# Patient Record
Sex: Male | Born: 1935
Health system: Southern US, Community
[De-identification: ages and names within clinical notes are randomized; demographics above are authoritative.]

## PROBLEM LIST (undated history)

## (undated) DIAGNOSIS — I10 Essential (primary) hypertension: Secondary | ICD-10-CM

## (undated) DIAGNOSIS — R739 Hyperglycemia, unspecified: Secondary | ICD-10-CM

## (undated) DIAGNOSIS — K429 Umbilical hernia without obstruction or gangrene: Secondary | ICD-10-CM

## (undated) DIAGNOSIS — K5792 Diverticulitis of intestine, part unspecified, without perforation or abscess without bleeding: Secondary | ICD-10-CM

## (undated) DIAGNOSIS — I340 Nonrheumatic mitral (valve) insufficiency: Secondary | ICD-10-CM

## (undated) DIAGNOSIS — K4090 Unilateral inguinal hernia, without obstruction or gangrene, not specified as recurrent: Secondary | ICD-10-CM

## (undated) DIAGNOSIS — I4821 Permanent atrial fibrillation: Secondary | ICD-10-CM

## (undated) DIAGNOSIS — C801 Malignant (primary) neoplasm, unspecified: Secondary | ICD-10-CM

## (undated) DIAGNOSIS — H3530 Unspecified macular degeneration: Secondary | ICD-10-CM

## (undated) HISTORY — DX: Diverticulitis of intestine, part unspecified, without perforation or abscess without bleeding: K57.92

## (undated) HISTORY — DX: Unspecified macular degeneration: H35.30

## (undated) HISTORY — DX: Nonrheumatic mitral (valve) insufficiency: I34.0

## (undated) HISTORY — DX: Umbilical hernia without obstruction or gangrene: K42.9

## (undated) HISTORY — DX: Hyperglycemia, unspecified: R73.9

## (undated) HISTORY — PX: IRRIGATION AND DEBRIDEMENT SEBACEOUS CYST: SHX5255

## (undated) HISTORY — PX: CATARACT EXTRACTION: SUR2

## (undated) HISTORY — DX: Unilateral inguinal hernia, without obstruction or gangrene, not specified as recurrent: K40.90

---

## 2001-11-25 ENCOUNTER — Ambulatory Visit (HOSPITAL_COMMUNITY): Admission: RE | Admit: 2001-11-25 | Discharge: 2001-11-25 | Payer: Self-pay | Admitting: Gastroenterology

## 2010-08-24 ENCOUNTER — Encounter: Payer: Self-pay | Admitting: Emergency Medicine

## 2010-08-24 ENCOUNTER — Emergency Department (HOSPITAL_BASED_OUTPATIENT_CLINIC_OR_DEPARTMENT_OTHER)
Admission: EM | Admit: 2010-08-24 | Discharge: 2010-08-24 | Disposition: A | Discharge status: Home / Self Care | Payer: Medicare Other | Attending: Emergency Medicine | Admitting: Emergency Medicine

## 2010-08-24 DIAGNOSIS — L089 Local infection of the skin and subcutaneous tissue, unspecified: Secondary | ICD-10-CM | POA: Insufficient documentation

## 2010-08-24 DIAGNOSIS — I10 Essential (primary) hypertension: Secondary | ICD-10-CM | POA: Insufficient documentation

## 2010-08-24 HISTORY — DX: Essential (primary) hypertension: I10

## 2010-08-24 MED ORDER — DOXYCYCLINE HYCLATE 100 MG PO CAPS: 100.0000 mg | ORAL_CAPSULE | Freq: Two times a day (BID) | ORAL | Status: AC

## 2010-08-24 NOTE — ED Provider Notes (Signed)
History     CSN: [id number] Arrival date & time: 08/24/2010  3:09 PM  Chief Complaint  Patient presents with  . Finger Injury    infection in right middle finger   The history is provided by the patient.  Several day hx of right middle finger distal infection, which in the past has drained pus. No further drainage. Improving just not resolving. Pain is 2/10 and made worse by palpation not by ROM function.  He thinks it occurred due to injury from removing pool tarp.  Past Medical History  Diagnosis Date  . Hypertension     History reviewed. No pertinent past surgical history.  History reviewed. No pertinent family history.  History  Substance Use Topics  . Smoking status: Never Smoker   . Smokeless tobacco: Not on file  . Alcohol Use: Yes     "very moderate"      Review of Systems  Constitutional: Negative for fever.  HENT: Negative for neck pain.   Respiratory: Negative for cough, chest tightness and shortness of breath.   Cardiovascular: Negative for chest pain.  Gastrointestinal: Negative for abdominal pain.  Musculoskeletal: Positive for joint swelling.  Skin: Positive for wound. Negative for rash.  Neurological: Negative for headaches.  Hematological: Does not bruise/bleed easily.    Physical Exam  BP 152/104  Pulse 62  Temp(Src) 97.5 F (36.4 C) (Oral)  Resp 18  SpO2 97%  Physical Exam  Constitutional: He is oriented to person, place, and time. He appears well-developed and well-nourished.  HENT:  Head: Normocephalic and atraumatic.  Eyes: Conjunctivae and EOM are normal. Pupils are equal, round, and reactive to light.  Neck: Normal range of motion.  Cardiovascular: Normal rate, regular rhythm and normal heart sounds.   Pulmonary/Chest: Effort normal and breath sounds normal.  Abdominal: Soft. Bowel sounds are normal.  Musculoskeletal: Normal range of motion. He exhibits tenderness.       RIGHT MIDDLE FINGER DISTAL WITH HEALING PARONYCHIAL INFECTION,  NO PURULENT DRAINAGE, NO SIG PAIN WITH ROM, CAP REFILL INTACT.   Neurological: He is alert and oriented to person, place, and time.    ED Course  Procedures  MDM Resolving right middle finger paronychial infection. No deep space infection. Will benefit with oral antibiotics.         Shelda Jakes, MD 08/24/10 [phone]

## 2010-08-24 NOTE — ED Notes (Signed)
Pt states he cut his finger Monday afternoon and noticed that his finger was red and swollen on Wednesday. Pt states that pus came out from finger. Pt cleaned site with alcohol but finger was still the same on Thursday.

## 2011-03-08 DIAGNOSIS — H353190 Nonexudative age-related macular degeneration, unspecified eye, stage unspecified: Secondary | ICD-10-CM | POA: Diagnosis not present

## 2011-03-08 DIAGNOSIS — H4011X0 Primary open-angle glaucoma, stage unspecified: Secondary | ICD-10-CM | POA: Diagnosis not present

## 2011-03-08 DIAGNOSIS — H47329 Drusen of optic disc, unspecified eye: Secondary | ICD-10-CM | POA: Diagnosis not present

## 2011-06-18 DIAGNOSIS — I10 Essential (primary) hypertension: Secondary | ICD-10-CM | POA: Diagnosis not present

## 2011-06-18 DIAGNOSIS — T148 Other injury of unspecified body region: Secondary | ICD-10-CM | POA: Diagnosis not present

## 2011-06-18 DIAGNOSIS — W57XXXA Bitten or stung by nonvenomous insect and other nonvenomous arthropods, initial encounter: Secondary | ICD-10-CM | POA: Diagnosis not present

## 2011-06-18 DIAGNOSIS — Z23 Encounter for immunization: Secondary | ICD-10-CM | POA: Diagnosis not present

## 2011-08-27 DIAGNOSIS — I059 Rheumatic mitral valve disease, unspecified: Secondary | ICD-10-CM | POA: Diagnosis not present

## 2011-08-27 DIAGNOSIS — Z79899 Other long term (current) drug therapy: Secondary | ICD-10-CM | POA: Diagnosis not present

## 2011-08-27 DIAGNOSIS — I10 Essential (primary) hypertension: Secondary | ICD-10-CM | POA: Diagnosis not present

## 2011-09-02 DIAGNOSIS — I059 Rheumatic mitral valve disease, unspecified: Secondary | ICD-10-CM | POA: Diagnosis not present

## 2011-09-13 DIAGNOSIS — D485 Neoplasm of uncertain behavior of skin: Secondary | ICD-10-CM | POA: Diagnosis not present

## 2011-09-13 DIAGNOSIS — L280 Lichen simplex chronicus: Secondary | ICD-10-CM | POA: Diagnosis not present

## 2011-09-13 DIAGNOSIS — I781 Nevus, non-neoplastic: Secondary | ICD-10-CM | POA: Diagnosis not present

## 2011-09-13 DIAGNOSIS — L570 Actinic keratosis: Secondary | ICD-10-CM | POA: Diagnosis not present

## 2011-09-13 DIAGNOSIS — L723 Sebaceous cyst: Secondary | ICD-10-CM | POA: Diagnosis not present

## 2011-09-13 DIAGNOSIS — L821 Other seborrheic keratosis: Secondary | ICD-10-CM | POA: Diagnosis not present

## 2011-11-29 DIAGNOSIS — Z23 Encounter for immunization: Secondary | ICD-10-CM | POA: Diagnosis not present

## 2012-03-30 DIAGNOSIS — H47329 Drusen of optic disc, unspecified eye: Secondary | ICD-10-CM | POA: Diagnosis not present

## 2012-03-30 DIAGNOSIS — H353190 Nonexudative age-related macular degeneration, unspecified eye, stage unspecified: Secondary | ICD-10-CM | POA: Diagnosis not present

## 2012-03-30 DIAGNOSIS — H409 Unspecified glaucoma: Secondary | ICD-10-CM | POA: Diagnosis not present

## 2012-03-30 DIAGNOSIS — H4011X0 Primary open-angle glaucoma, stage unspecified: Secondary | ICD-10-CM | POA: Diagnosis not present

## 2012-04-15 ENCOUNTER — Other Ambulatory Visit: Payer: Self-pay | Admitting: Internal Medicine

## 2012-04-15 DIAGNOSIS — R1031 Right lower quadrant pain: Secondary | ICD-10-CM

## 2012-04-16 ENCOUNTER — Ambulatory Visit
Admission: RE | Admit: 2012-04-16 | Discharge: 2012-04-16 | Disposition: A | Discharge status: Home / Self Care | Payer: Medicare Other | Source: Ambulatory Visit | Attending: Internal Medicine | Admitting: Internal Medicine

## 2012-04-16 DIAGNOSIS — R1031 Right lower quadrant pain: Secondary | ICD-10-CM

## 2012-04-16 MED ORDER — IOHEXOL 300 MG/ML  SOLN
100.0000 mL | Freq: Once | INTRAMUSCULAR | Status: AC | PRN
  Administered 2012-04-16: 100 mL via INTRAVENOUS

## 2012-05-07 DIAGNOSIS — M999 Biomechanical lesion, unspecified: Secondary | ICD-10-CM | POA: Diagnosis not present

## 2012-05-07 DIAGNOSIS — I10 Essential (primary) hypertension: Secondary | ICD-10-CM | POA: Diagnosis not present

## 2012-05-07 DIAGNOSIS — M461 Sacroiliitis, not elsewhere classified: Secondary | ICD-10-CM | POA: Diagnosis not present

## 2012-05-07 DIAGNOSIS — IMO0002 Reserved for concepts with insufficient information to code with codable children: Secondary | ICD-10-CM | POA: Diagnosis not present

## 2012-05-11 DIAGNOSIS — M999 Biomechanical lesion, unspecified: Secondary | ICD-10-CM | POA: Diagnosis not present

## 2012-05-11 DIAGNOSIS — M461 Sacroiliitis, not elsewhere classified: Secondary | ICD-10-CM | POA: Diagnosis not present

## 2012-05-11 DIAGNOSIS — IMO0002 Reserved for concepts with insufficient information to code with codable children: Secondary | ICD-10-CM | POA: Diagnosis not present

## 2012-05-11 DIAGNOSIS — I10 Essential (primary) hypertension: Secondary | ICD-10-CM | POA: Diagnosis not present

## 2012-05-12 DIAGNOSIS — M461 Sacroiliitis, not elsewhere classified: Secondary | ICD-10-CM | POA: Diagnosis not present

## 2012-05-12 DIAGNOSIS — M999 Biomechanical lesion, unspecified: Secondary | ICD-10-CM | POA: Diagnosis not present

## 2012-05-12 DIAGNOSIS — I10 Essential (primary) hypertension: Secondary | ICD-10-CM | POA: Diagnosis not present

## 2012-05-12 DIAGNOSIS — IMO0002 Reserved for concepts with insufficient information to code with codable children: Secondary | ICD-10-CM | POA: Diagnosis not present

## 2012-05-14 DIAGNOSIS — I10 Essential (primary) hypertension: Secondary | ICD-10-CM | POA: Diagnosis not present

## 2012-05-14 DIAGNOSIS — M461 Sacroiliitis, not elsewhere classified: Secondary | ICD-10-CM | POA: Diagnosis not present

## 2012-05-14 DIAGNOSIS — IMO0002 Reserved for concepts with insufficient information to code with codable children: Secondary | ICD-10-CM | POA: Diagnosis not present

## 2012-05-14 DIAGNOSIS — M999 Biomechanical lesion, unspecified: Secondary | ICD-10-CM | POA: Diagnosis not present

## 2012-05-18 DIAGNOSIS — IMO0002 Reserved for concepts with insufficient information to code with codable children: Secondary | ICD-10-CM | POA: Diagnosis not present

## 2012-05-18 DIAGNOSIS — I10 Essential (primary) hypertension: Secondary | ICD-10-CM | POA: Diagnosis not present

## 2012-05-18 DIAGNOSIS — M999 Biomechanical lesion, unspecified: Secondary | ICD-10-CM | POA: Diagnosis not present

## 2012-05-18 DIAGNOSIS — M461 Sacroiliitis, not elsewhere classified: Secondary | ICD-10-CM | POA: Diagnosis not present

## 2012-05-19 DIAGNOSIS — I10 Essential (primary) hypertension: Secondary | ICD-10-CM | POA: Diagnosis not present

## 2012-05-19 DIAGNOSIS — M461 Sacroiliitis, not elsewhere classified: Secondary | ICD-10-CM | POA: Diagnosis not present

## 2012-05-19 DIAGNOSIS — M999 Biomechanical lesion, unspecified: Secondary | ICD-10-CM | POA: Diagnosis not present

## 2012-05-19 DIAGNOSIS — IMO0002 Reserved for concepts with insufficient information to code with codable children: Secondary | ICD-10-CM | POA: Diagnosis not present

## 2012-05-21 DIAGNOSIS — IMO0002 Reserved for concepts with insufficient information to code with codable children: Secondary | ICD-10-CM | POA: Diagnosis not present

## 2012-05-21 DIAGNOSIS — M999 Biomechanical lesion, unspecified: Secondary | ICD-10-CM | POA: Diagnosis not present

## 2012-05-21 DIAGNOSIS — I10 Essential (primary) hypertension: Secondary | ICD-10-CM | POA: Diagnosis not present

## 2012-05-21 DIAGNOSIS — M461 Sacroiliitis, not elsewhere classified: Secondary | ICD-10-CM | POA: Diagnosis not present

## 2012-05-25 DIAGNOSIS — I10 Essential (primary) hypertension: Secondary | ICD-10-CM | POA: Diagnosis not present

## 2012-05-25 DIAGNOSIS — M999 Biomechanical lesion, unspecified: Secondary | ICD-10-CM | POA: Diagnosis not present

## 2012-05-25 DIAGNOSIS — M461 Sacroiliitis, not elsewhere classified: Secondary | ICD-10-CM | POA: Diagnosis not present

## 2012-05-25 DIAGNOSIS — IMO0002 Reserved for concepts with insufficient information to code with codable children: Secondary | ICD-10-CM | POA: Diagnosis not present

## 2012-05-26 DIAGNOSIS — M999 Biomechanical lesion, unspecified: Secondary | ICD-10-CM | POA: Diagnosis not present

## 2012-05-26 DIAGNOSIS — IMO0002 Reserved for concepts with insufficient information to code with codable children: Secondary | ICD-10-CM | POA: Diagnosis not present

## 2012-05-26 DIAGNOSIS — I10 Essential (primary) hypertension: Secondary | ICD-10-CM | POA: Diagnosis not present

## 2012-05-26 DIAGNOSIS — M461 Sacroiliitis, not elsewhere classified: Secondary | ICD-10-CM | POA: Diagnosis not present

## 2012-05-28 DIAGNOSIS — M461 Sacroiliitis, not elsewhere classified: Secondary | ICD-10-CM | POA: Diagnosis not present

## 2012-05-28 DIAGNOSIS — IMO0002 Reserved for concepts with insufficient information to code with codable children: Secondary | ICD-10-CM | POA: Diagnosis not present

## 2012-05-28 DIAGNOSIS — I10 Essential (primary) hypertension: Secondary | ICD-10-CM | POA: Diagnosis not present

## 2012-05-28 DIAGNOSIS — M999 Biomechanical lesion, unspecified: Secondary | ICD-10-CM | POA: Diagnosis not present

## 2012-06-02 DIAGNOSIS — M461 Sacroiliitis, not elsewhere classified: Secondary | ICD-10-CM | POA: Diagnosis not present

## 2012-06-02 DIAGNOSIS — I10 Essential (primary) hypertension: Secondary | ICD-10-CM | POA: Diagnosis not present

## 2012-06-02 DIAGNOSIS — M999 Biomechanical lesion, unspecified: Secondary | ICD-10-CM | POA: Diagnosis not present

## 2012-06-02 DIAGNOSIS — IMO0002 Reserved for concepts with insufficient information to code with codable children: Secondary | ICD-10-CM | POA: Diagnosis not present

## 2012-06-04 DIAGNOSIS — M461 Sacroiliitis, not elsewhere classified: Secondary | ICD-10-CM | POA: Diagnosis not present

## 2012-06-04 DIAGNOSIS — IMO0002 Reserved for concepts with insufficient information to code with codable children: Secondary | ICD-10-CM | POA: Diagnosis not present

## 2012-06-04 DIAGNOSIS — M999 Biomechanical lesion, unspecified: Secondary | ICD-10-CM | POA: Diagnosis not present

## 2012-06-04 DIAGNOSIS — I10 Essential (primary) hypertension: Secondary | ICD-10-CM | POA: Diagnosis not present

## 2012-06-08 DIAGNOSIS — M461 Sacroiliitis, not elsewhere classified: Secondary | ICD-10-CM | POA: Diagnosis not present

## 2012-06-08 DIAGNOSIS — IMO0002 Reserved for concepts with insufficient information to code with codable children: Secondary | ICD-10-CM | POA: Diagnosis not present

## 2012-06-08 DIAGNOSIS — I10 Essential (primary) hypertension: Secondary | ICD-10-CM | POA: Diagnosis not present

## 2012-06-08 DIAGNOSIS — M999 Biomechanical lesion, unspecified: Secondary | ICD-10-CM | POA: Diagnosis not present

## 2012-06-11 DIAGNOSIS — M461 Sacroiliitis, not elsewhere classified: Secondary | ICD-10-CM | POA: Diagnosis not present

## 2012-06-11 DIAGNOSIS — IMO0002 Reserved for concepts with insufficient information to code with codable children: Secondary | ICD-10-CM | POA: Diagnosis not present

## 2012-06-11 DIAGNOSIS — M999 Biomechanical lesion, unspecified: Secondary | ICD-10-CM | POA: Diagnosis not present

## 2012-06-11 DIAGNOSIS — I10 Essential (primary) hypertension: Secondary | ICD-10-CM | POA: Diagnosis not present

## 2012-06-15 DIAGNOSIS — I10 Essential (primary) hypertension: Secondary | ICD-10-CM | POA: Diagnosis not present

## 2012-06-15 DIAGNOSIS — M461 Sacroiliitis, not elsewhere classified: Secondary | ICD-10-CM | POA: Diagnosis not present

## 2012-06-15 DIAGNOSIS — M999 Biomechanical lesion, unspecified: Secondary | ICD-10-CM | POA: Diagnosis not present

## 2012-06-15 DIAGNOSIS — IMO0002 Reserved for concepts with insufficient information to code with codable children: Secondary | ICD-10-CM | POA: Diagnosis not present

## 2012-06-24 DIAGNOSIS — I10 Essential (primary) hypertension: Secondary | ICD-10-CM | POA: Diagnosis not present

## 2012-06-24 DIAGNOSIS — M461 Sacroiliitis, not elsewhere classified: Secondary | ICD-10-CM | POA: Diagnosis not present

## 2012-06-24 DIAGNOSIS — IMO0002 Reserved for concepts with insufficient information to code with codable children: Secondary | ICD-10-CM | POA: Diagnosis not present

## 2012-06-24 DIAGNOSIS — M999 Biomechanical lesion, unspecified: Secondary | ICD-10-CM | POA: Diagnosis not present

## 2012-06-25 DIAGNOSIS — I10 Essential (primary) hypertension: Secondary | ICD-10-CM | POA: Diagnosis not present

## 2012-06-25 DIAGNOSIS — M461 Sacroiliitis, not elsewhere classified: Secondary | ICD-10-CM | POA: Diagnosis not present

## 2012-06-25 DIAGNOSIS — M999 Biomechanical lesion, unspecified: Secondary | ICD-10-CM | POA: Diagnosis not present

## 2012-06-25 DIAGNOSIS — IMO0002 Reserved for concepts with insufficient information to code with codable children: Secondary | ICD-10-CM | POA: Diagnosis not present

## 2012-06-29 DIAGNOSIS — I10 Essential (primary) hypertension: Secondary | ICD-10-CM | POA: Diagnosis not present

## 2012-06-29 DIAGNOSIS — M999 Biomechanical lesion, unspecified: Secondary | ICD-10-CM | POA: Diagnosis not present

## 2012-06-29 DIAGNOSIS — M461 Sacroiliitis, not elsewhere classified: Secondary | ICD-10-CM | POA: Diagnosis not present

## 2012-06-29 DIAGNOSIS — IMO0002 Reserved for concepts with insufficient information to code with codable children: Secondary | ICD-10-CM | POA: Diagnosis not present

## 2012-07-02 DIAGNOSIS — M461 Sacroiliitis, not elsewhere classified: Secondary | ICD-10-CM | POA: Diagnosis not present

## 2012-07-02 DIAGNOSIS — I10 Essential (primary) hypertension: Secondary | ICD-10-CM | POA: Diagnosis not present

## 2012-07-02 DIAGNOSIS — IMO0002 Reserved for concepts with insufficient information to code with codable children: Secondary | ICD-10-CM | POA: Diagnosis not present

## 2012-07-02 DIAGNOSIS — M999 Biomechanical lesion, unspecified: Secondary | ICD-10-CM | POA: Diagnosis not present

## 2012-07-06 DIAGNOSIS — I10 Essential (primary) hypertension: Secondary | ICD-10-CM | POA: Diagnosis not present

## 2012-07-06 DIAGNOSIS — Z1331 Encounter for screening for depression: Secondary | ICD-10-CM | POA: Diagnosis not present

## 2012-07-06 DIAGNOSIS — I059 Rheumatic mitral valve disease, unspecified: Secondary | ICD-10-CM | POA: Diagnosis not present

## 2012-07-06 DIAGNOSIS — Z0000 Encounter for general adult medical examination without abnormal findings: Secondary | ICD-10-CM | POA: Diagnosis not present

## 2012-07-13 DIAGNOSIS — IMO0002 Reserved for concepts with insufficient information to code with codable children: Secondary | ICD-10-CM | POA: Diagnosis not present

## 2012-07-13 DIAGNOSIS — M461 Sacroiliitis, not elsewhere classified: Secondary | ICD-10-CM | POA: Diagnosis not present

## 2012-07-13 DIAGNOSIS — M999 Biomechanical lesion, unspecified: Secondary | ICD-10-CM | POA: Diagnosis not present

## 2012-07-13 DIAGNOSIS — I10 Essential (primary) hypertension: Secondary | ICD-10-CM | POA: Diagnosis not present

## 2012-07-20 DIAGNOSIS — M461 Sacroiliitis, not elsewhere classified: Secondary | ICD-10-CM | POA: Diagnosis not present

## 2012-07-20 DIAGNOSIS — I10 Essential (primary) hypertension: Secondary | ICD-10-CM | POA: Diagnosis not present

## 2012-07-20 DIAGNOSIS — M999 Biomechanical lesion, unspecified: Secondary | ICD-10-CM | POA: Diagnosis not present

## 2012-07-20 DIAGNOSIS — IMO0002 Reserved for concepts with insufficient information to code with codable children: Secondary | ICD-10-CM | POA: Diagnosis not present

## 2012-08-04 DIAGNOSIS — M999 Biomechanical lesion, unspecified: Secondary | ICD-10-CM | POA: Diagnosis not present

## 2012-08-04 DIAGNOSIS — I10 Essential (primary) hypertension: Secondary | ICD-10-CM | POA: Diagnosis not present

## 2012-08-04 DIAGNOSIS — M461 Sacroiliitis, not elsewhere classified: Secondary | ICD-10-CM | POA: Diagnosis not present

## 2012-08-04 DIAGNOSIS — IMO0002 Reserved for concepts with insufficient information to code with codable children: Secondary | ICD-10-CM | POA: Diagnosis not present

## 2012-08-13 DIAGNOSIS — I10 Essential (primary) hypertension: Secondary | ICD-10-CM | POA: Diagnosis not present

## 2012-08-13 DIAGNOSIS — M999 Biomechanical lesion, unspecified: Secondary | ICD-10-CM | POA: Diagnosis not present

## 2012-08-13 DIAGNOSIS — M461 Sacroiliitis, not elsewhere classified: Secondary | ICD-10-CM | POA: Diagnosis not present

## 2012-08-13 DIAGNOSIS — IMO0002 Reserved for concepts with insufficient information to code with codable children: Secondary | ICD-10-CM | POA: Diagnosis not present

## 2012-09-08 DIAGNOSIS — I10 Essential (primary) hypertension: Secondary | ICD-10-CM | POA: Diagnosis not present

## 2012-09-08 DIAGNOSIS — M999 Biomechanical lesion, unspecified: Secondary | ICD-10-CM | POA: Diagnosis not present

## 2012-09-08 DIAGNOSIS — M461 Sacroiliitis, not elsewhere classified: Secondary | ICD-10-CM | POA: Diagnosis not present

## 2012-09-08 DIAGNOSIS — IMO0002 Reserved for concepts with insufficient information to code with codable children: Secondary | ICD-10-CM | POA: Diagnosis not present

## 2012-10-01 DIAGNOSIS — H353190 Nonexudative age-related macular degeneration, unspecified eye, stage unspecified: Secondary | ICD-10-CM | POA: Diagnosis not present

## 2012-10-01 DIAGNOSIS — H47329 Drusen of optic disc, unspecified eye: Secondary | ICD-10-CM | POA: Diagnosis not present

## 2012-10-01 DIAGNOSIS — H4011X0 Primary open-angle glaucoma, stage unspecified: Secondary | ICD-10-CM | POA: Diagnosis not present

## 2012-10-01 DIAGNOSIS — H409 Unspecified glaucoma: Secondary | ICD-10-CM | POA: Diagnosis not present

## 2012-10-01 DIAGNOSIS — H401110 Primary open-angle glaucoma, right eye, stage unspecified: Secondary | ICD-10-CM | POA: Insufficient documentation

## 2012-11-23 DIAGNOSIS — Z23 Encounter for immunization: Secondary | ICD-10-CM | POA: Diagnosis not present

## 2012-11-26 DIAGNOSIS — H2510 Age-related nuclear cataract, unspecified eye: Secondary | ICD-10-CM | POA: Diagnosis not present

## 2012-11-26 DIAGNOSIS — H47329 Drusen of optic disc, unspecified eye: Secondary | ICD-10-CM | POA: Diagnosis not present

## 2012-11-26 DIAGNOSIS — H353190 Nonexudative age-related macular degeneration, unspecified eye, stage unspecified: Secondary | ICD-10-CM | POA: Diagnosis not present

## 2012-11-26 DIAGNOSIS — H40019 Open angle with borderline findings, low risk, unspecified eye: Secondary | ICD-10-CM | POA: Diagnosis not present

## 2013-02-08 DIAGNOSIS — I10 Essential (primary) hypertension: Secondary | ICD-10-CM | POA: Diagnosis not present

## 2013-02-08 DIAGNOSIS — Z79899 Other long term (current) drug therapy: Secondary | ICD-10-CM | POA: Diagnosis not present

## 2013-06-30 ENCOUNTER — Encounter: Payer: Self-pay | Admitting: *Deleted

## 2013-07-01 DIAGNOSIS — H409 Unspecified glaucoma: Secondary | ICD-10-CM | POA: Diagnosis not present

## 2013-07-01 DIAGNOSIS — H353190 Nonexudative age-related macular degeneration, unspecified eye, stage unspecified: Secondary | ICD-10-CM | POA: Diagnosis not present

## 2013-07-01 DIAGNOSIS — H4011X0 Primary open-angle glaucoma, stage unspecified: Secondary | ICD-10-CM | POA: Diagnosis not present

## 2013-07-01 DIAGNOSIS — H47329 Drusen of optic disc, unspecified eye: Secondary | ICD-10-CM | POA: Diagnosis not present

## 2013-09-01 DIAGNOSIS — Z1331 Encounter for screening for depression: Secondary | ICD-10-CM | POA: Diagnosis not present

## 2013-09-01 DIAGNOSIS — Z79899 Other long term (current) drug therapy: Secondary | ICD-10-CM | POA: Diagnosis not present

## 2013-09-01 DIAGNOSIS — M25569 Pain in unspecified knee: Secondary | ICD-10-CM | POA: Diagnosis not present

## 2013-09-01 DIAGNOSIS — Z23 Encounter for immunization: Secondary | ICD-10-CM | POA: Diagnosis not present

## 2013-09-01 DIAGNOSIS — I10 Essential (primary) hypertension: Secondary | ICD-10-CM | POA: Diagnosis not present

## 2013-09-01 DIAGNOSIS — Z0000 Encounter for general adult medical examination without abnormal findings: Secondary | ICD-10-CM | POA: Diagnosis not present

## 2013-11-15 DIAGNOSIS — Z23 Encounter for immunization: Secondary | ICD-10-CM | POA: Diagnosis not present

## 2014-02-09 DIAGNOSIS — H4011X3 Primary open-angle glaucoma, severe stage: Secondary | ICD-10-CM | POA: Diagnosis not present

## 2014-02-09 DIAGNOSIS — H47323 Drusen of optic disc, bilateral: Secondary | ICD-10-CM | POA: Diagnosis not present

## 2014-02-09 DIAGNOSIS — H3531 Nonexudative age-related macular degeneration: Secondary | ICD-10-CM | POA: Diagnosis not present

## 2014-02-09 DIAGNOSIS — H4011X2 Primary open-angle glaucoma, moderate stage: Secondary | ICD-10-CM | POA: Diagnosis not present

## 2014-02-18 DIAGNOSIS — E669 Obesity, unspecified: Secondary | ICD-10-CM | POA: Diagnosis not present

## 2014-02-18 DIAGNOSIS — Z79899 Other long term (current) drug therapy: Secondary | ICD-10-CM | POA: Diagnosis not present

## 2014-02-18 DIAGNOSIS — I10 Essential (primary) hypertension: Secondary | ICD-10-CM | POA: Diagnosis not present

## 2014-02-18 DIAGNOSIS — Z6830 Body mass index (BMI) 30.0-30.9, adult: Secondary | ICD-10-CM | POA: Diagnosis not present

## 2014-05-06 DIAGNOSIS — H47323 Drusen of optic disc, bilateral: Secondary | ICD-10-CM | POA: Diagnosis not present

## 2014-05-06 DIAGNOSIS — H40013 Open angle with borderline findings, low risk, bilateral: Secondary | ICD-10-CM | POA: Diagnosis not present

## 2014-05-06 DIAGNOSIS — H25811 Combined forms of age-related cataract, right eye: Secondary | ICD-10-CM | POA: Diagnosis not present

## 2014-05-06 DIAGNOSIS — H3531 Nonexudative age-related macular degeneration: Secondary | ICD-10-CM | POA: Diagnosis not present

## 2014-05-06 DIAGNOSIS — H25813 Combined forms of age-related cataract, bilateral: Secondary | ICD-10-CM | POA: Diagnosis not present

## 2014-05-16 DIAGNOSIS — H2511 Age-related nuclear cataract, right eye: Secondary | ICD-10-CM | POA: Diagnosis not present

## 2014-05-16 DIAGNOSIS — H268 Other specified cataract: Secondary | ICD-10-CM | POA: Diagnosis not present

## 2014-06-08 DIAGNOSIS — H2512 Age-related nuclear cataract, left eye: Secondary | ICD-10-CM | POA: Diagnosis not present

## 2014-06-13 DIAGNOSIS — H2512 Age-related nuclear cataract, left eye: Secondary | ICD-10-CM | POA: Diagnosis not present

## 2014-06-13 DIAGNOSIS — H25812 Combined forms of age-related cataract, left eye: Secondary | ICD-10-CM | POA: Diagnosis not present

## 2014-11-08 DIAGNOSIS — Z23 Encounter for immunization: Secondary | ICD-10-CM | POA: Diagnosis not present

## 2014-12-19 DIAGNOSIS — L821 Other seborrheic keratosis: Secondary | ICD-10-CM | POA: Diagnosis not present

## 2014-12-19 DIAGNOSIS — D1801 Hemangioma of skin and subcutaneous tissue: Secondary | ICD-10-CM | POA: Diagnosis not present

## 2014-12-19 DIAGNOSIS — D225 Melanocytic nevi of trunk: Secondary | ICD-10-CM | POA: Diagnosis not present

## 2014-12-19 DIAGNOSIS — D2272 Melanocytic nevi of left lower limb, including hip: Secondary | ICD-10-CM | POA: Diagnosis not present

## 2014-12-19 DIAGNOSIS — L570 Actinic keratosis: Secondary | ICD-10-CM | POA: Diagnosis not present

## 2014-12-19 DIAGNOSIS — L812 Freckles: Secondary | ICD-10-CM | POA: Diagnosis not present

## 2014-12-19 DIAGNOSIS — L723 Sebaceous cyst: Secondary | ICD-10-CM | POA: Diagnosis not present

## 2014-12-19 DIAGNOSIS — L820 Inflamed seborrheic keratosis: Secondary | ICD-10-CM | POA: Diagnosis not present

## 2014-12-19 DIAGNOSIS — D2271 Melanocytic nevi of right lower limb, including hip: Secondary | ICD-10-CM | POA: Diagnosis not present

## 2015-02-01 DIAGNOSIS — H47323 Drusen of optic disc, bilateral: Secondary | ICD-10-CM | POA: Diagnosis not present

## 2015-02-01 DIAGNOSIS — Z961 Presence of intraocular lens: Secondary | ICD-10-CM | POA: Diagnosis not present

## 2015-02-01 DIAGNOSIS — H40013 Open angle with borderline findings, low risk, bilateral: Secondary | ICD-10-CM | POA: Diagnosis not present

## 2015-02-01 DIAGNOSIS — H353131 Nonexudative age-related macular degeneration, bilateral, early dry stage: Secondary | ICD-10-CM | POA: Diagnosis not present

## 2015-05-22 DIAGNOSIS — S30860A Insect bite (nonvenomous) of lower back and pelvis, initial encounter: Secondary | ICD-10-CM | POA: Diagnosis not present

## 2015-05-22 DIAGNOSIS — W57XXXA Bitten or stung by nonvenomous insect and other nonvenomous arthropods, initial encounter: Secondary | ICD-10-CM | POA: Diagnosis not present

## 2015-08-14 DIAGNOSIS — Z1389 Encounter for screening for other disorder: Secondary | ICD-10-CM | POA: Diagnosis not present

## 2015-08-14 DIAGNOSIS — I340 Nonrheumatic mitral (valve) insufficiency: Secondary | ICD-10-CM | POA: Diagnosis not present

## 2015-08-14 DIAGNOSIS — Z0000 Encounter for general adult medical examination without abnormal findings: Secondary | ICD-10-CM | POA: Diagnosis not present

## 2015-08-14 DIAGNOSIS — I10 Essential (primary) hypertension: Secondary | ICD-10-CM | POA: Diagnosis not present

## 2015-08-14 DIAGNOSIS — Z79899 Other long term (current) drug therapy: Secondary | ICD-10-CM | POA: Diagnosis not present

## 2015-08-14 DIAGNOSIS — R739 Hyperglycemia, unspecified: Secondary | ICD-10-CM | POA: Diagnosis not present

## 2015-08-15 ENCOUNTER — Other Ambulatory Visit: Payer: Self-pay | Admitting: Geriatric Medicine

## 2015-08-15 DIAGNOSIS — I340 Nonrheumatic mitral (valve) insufficiency: Secondary | ICD-10-CM

## 2015-08-25 DIAGNOSIS — M5136 Other intervertebral disc degeneration, lumbar region: Secondary | ICD-10-CM | POA: Diagnosis not present

## 2015-08-25 DIAGNOSIS — M9903 Segmental and somatic dysfunction of lumbar region: Secondary | ICD-10-CM | POA: Diagnosis not present

## 2015-08-25 DIAGNOSIS — M545 Low back pain: Secondary | ICD-10-CM | POA: Diagnosis not present

## 2015-08-25 DIAGNOSIS — M62830 Muscle spasm of back: Secondary | ICD-10-CM | POA: Diagnosis not present

## 2015-09-12 ENCOUNTER — Other Ambulatory Visit (HOSPITAL_COMMUNITY): Payer: Self-pay

## 2015-09-13 DIAGNOSIS — M9903 Segmental and somatic dysfunction of lumbar region: Secondary | ICD-10-CM | POA: Diagnosis not present

## 2015-09-13 DIAGNOSIS — M545 Low back pain: Secondary | ICD-10-CM | POA: Diagnosis not present

## 2015-09-13 DIAGNOSIS — M62830 Muscle spasm of back: Secondary | ICD-10-CM | POA: Diagnosis not present

## 2015-09-13 DIAGNOSIS — M5136 Other intervertebral disc degeneration, lumbar region: Secondary | ICD-10-CM | POA: Diagnosis not present

## 2015-09-14 DIAGNOSIS — M62830 Muscle spasm of back: Secondary | ICD-10-CM | POA: Diagnosis not present

## 2015-09-14 DIAGNOSIS — M5136 Other intervertebral disc degeneration, lumbar region: Secondary | ICD-10-CM | POA: Diagnosis not present

## 2015-09-14 DIAGNOSIS — M545 Low back pain: Secondary | ICD-10-CM | POA: Diagnosis not present

## 2015-09-14 DIAGNOSIS — M9903 Segmental and somatic dysfunction of lumbar region: Secondary | ICD-10-CM | POA: Diagnosis not present

## 2015-09-18 DIAGNOSIS — M9903 Segmental and somatic dysfunction of lumbar region: Secondary | ICD-10-CM | POA: Diagnosis not present

## 2015-09-18 DIAGNOSIS — M62830 Muscle spasm of back: Secondary | ICD-10-CM | POA: Diagnosis not present

## 2015-09-18 DIAGNOSIS — M5136 Other intervertebral disc degeneration, lumbar region: Secondary | ICD-10-CM | POA: Diagnosis not present

## 2015-09-18 DIAGNOSIS — M545 Low back pain: Secondary | ICD-10-CM | POA: Diagnosis not present

## 2015-09-19 DIAGNOSIS — H40013 Open angle with borderline findings, low risk, bilateral: Secondary | ICD-10-CM | POA: Diagnosis not present

## 2015-09-19 DIAGNOSIS — Z961 Presence of intraocular lens: Secondary | ICD-10-CM | POA: Diagnosis not present

## 2015-09-19 DIAGNOSIS — H353131 Nonexudative age-related macular degeneration, bilateral, early dry stage: Secondary | ICD-10-CM | POA: Diagnosis not present

## 2015-09-19 DIAGNOSIS — H47323 Drusen of optic disc, bilateral: Secondary | ICD-10-CM | POA: Diagnosis not present

## 2015-09-20 DIAGNOSIS — M545 Low back pain: Secondary | ICD-10-CM | POA: Diagnosis not present

## 2015-09-20 DIAGNOSIS — M9903 Segmental and somatic dysfunction of lumbar region: Secondary | ICD-10-CM | POA: Diagnosis not present

## 2015-09-20 DIAGNOSIS — M5136 Other intervertebral disc degeneration, lumbar region: Secondary | ICD-10-CM | POA: Diagnosis not present

## 2015-09-20 DIAGNOSIS — M62830 Muscle spasm of back: Secondary | ICD-10-CM | POA: Diagnosis not present

## 2015-09-21 DIAGNOSIS — M545 Low back pain: Secondary | ICD-10-CM | POA: Diagnosis not present

## 2015-09-21 DIAGNOSIS — M9903 Segmental and somatic dysfunction of lumbar region: Secondary | ICD-10-CM | POA: Diagnosis not present

## 2015-09-21 DIAGNOSIS — M62830 Muscle spasm of back: Secondary | ICD-10-CM | POA: Diagnosis not present

## 2015-09-21 DIAGNOSIS — M5136 Other intervertebral disc degeneration, lumbar region: Secondary | ICD-10-CM | POA: Diagnosis not present

## 2015-09-25 DIAGNOSIS — M62830 Muscle spasm of back: Secondary | ICD-10-CM | POA: Diagnosis not present

## 2015-09-25 DIAGNOSIS — M545 Low back pain: Secondary | ICD-10-CM | POA: Diagnosis not present

## 2015-09-25 DIAGNOSIS — M5136 Other intervertebral disc degeneration, lumbar region: Secondary | ICD-10-CM | POA: Diagnosis not present

## 2015-09-25 DIAGNOSIS — M9903 Segmental and somatic dysfunction of lumbar region: Secondary | ICD-10-CM | POA: Diagnosis not present

## 2015-09-27 DIAGNOSIS — M62830 Muscle spasm of back: Secondary | ICD-10-CM | POA: Diagnosis not present

## 2015-09-27 DIAGNOSIS — M545 Low back pain: Secondary | ICD-10-CM | POA: Diagnosis not present

## 2015-09-27 DIAGNOSIS — M5136 Other intervertebral disc degeneration, lumbar region: Secondary | ICD-10-CM | POA: Diagnosis not present

## 2015-09-27 DIAGNOSIS — M9903 Segmental and somatic dysfunction of lumbar region: Secondary | ICD-10-CM | POA: Diagnosis not present

## 2015-09-28 DIAGNOSIS — M5136 Other intervertebral disc degeneration, lumbar region: Secondary | ICD-10-CM | POA: Diagnosis not present

## 2015-09-28 DIAGNOSIS — M9903 Segmental and somatic dysfunction of lumbar region: Secondary | ICD-10-CM | POA: Diagnosis not present

## 2015-09-28 DIAGNOSIS — M62830 Muscle spasm of back: Secondary | ICD-10-CM | POA: Diagnosis not present

## 2015-09-28 DIAGNOSIS — M545 Low back pain: Secondary | ICD-10-CM | POA: Diagnosis not present

## 2015-10-02 DIAGNOSIS — M5136 Other intervertebral disc degeneration, lumbar region: Secondary | ICD-10-CM | POA: Diagnosis not present

## 2015-10-02 DIAGNOSIS — M9903 Segmental and somatic dysfunction of lumbar region: Secondary | ICD-10-CM | POA: Diagnosis not present

## 2015-10-02 DIAGNOSIS — M545 Low back pain: Secondary | ICD-10-CM | POA: Diagnosis not present

## 2015-10-02 DIAGNOSIS — M62830 Muscle spasm of back: Secondary | ICD-10-CM | POA: Diagnosis not present

## 2015-10-03 ENCOUNTER — Ambulatory Visit (HOSPITAL_COMMUNITY): Payer: Medicare Other | Attending: Cardiology

## 2015-10-03 ENCOUNTER — Other Ambulatory Visit: Payer: Self-pay

## 2015-10-03 DIAGNOSIS — Z87891 Personal history of nicotine dependence: Secondary | ICD-10-CM | POA: Insufficient documentation

## 2015-10-03 DIAGNOSIS — I071 Rheumatic tricuspid insufficiency: Secondary | ICD-10-CM | POA: Insufficient documentation

## 2015-10-03 DIAGNOSIS — I358 Other nonrheumatic aortic valve disorders: Secondary | ICD-10-CM | POA: Insufficient documentation

## 2015-10-03 DIAGNOSIS — I4891 Unspecified atrial fibrillation: Secondary | ICD-10-CM | POA: Insufficient documentation

## 2015-10-03 DIAGNOSIS — I77810 Thoracic aortic ectasia: Secondary | ICD-10-CM | POA: Insufficient documentation

## 2015-10-03 DIAGNOSIS — R7303 Prediabetes: Secondary | ICD-10-CM | POA: Insufficient documentation

## 2015-10-03 DIAGNOSIS — I340 Nonrheumatic mitral (valve) insufficiency: Secondary | ICD-10-CM | POA: Insufficient documentation

## 2015-10-03 DIAGNOSIS — I119 Hypertensive heart disease without heart failure: Secondary | ICD-10-CM | POA: Diagnosis not present

## 2015-10-04 DIAGNOSIS — M5136 Other intervertebral disc degeneration, lumbar region: Secondary | ICD-10-CM | POA: Diagnosis not present

## 2015-10-04 DIAGNOSIS — M545 Low back pain: Secondary | ICD-10-CM | POA: Diagnosis not present

## 2015-10-04 DIAGNOSIS — M9903 Segmental and somatic dysfunction of lumbar region: Secondary | ICD-10-CM | POA: Diagnosis not present

## 2015-10-04 DIAGNOSIS — M62830 Muscle spasm of back: Secondary | ICD-10-CM | POA: Diagnosis not present

## 2015-10-05 DIAGNOSIS — M9903 Segmental and somatic dysfunction of lumbar region: Secondary | ICD-10-CM | POA: Diagnosis not present

## 2015-10-05 DIAGNOSIS — M545 Low back pain: Secondary | ICD-10-CM | POA: Diagnosis not present

## 2015-10-05 DIAGNOSIS — M62830 Muscle spasm of back: Secondary | ICD-10-CM | POA: Diagnosis not present

## 2015-10-05 DIAGNOSIS — M5136 Other intervertebral disc degeneration, lumbar region: Secondary | ICD-10-CM | POA: Diagnosis not present

## 2015-10-09 ENCOUNTER — Other Ambulatory Visit: Payer: Self-pay | Admitting: Geriatric Medicine

## 2015-10-09 DIAGNOSIS — I7789 Other specified disorders of arteries and arterioles: Secondary | ICD-10-CM

## 2015-10-09 DIAGNOSIS — M5136 Other intervertebral disc degeneration, lumbar region: Secondary | ICD-10-CM | POA: Diagnosis not present

## 2015-10-09 DIAGNOSIS — M9903 Segmental and somatic dysfunction of lumbar region: Secondary | ICD-10-CM | POA: Diagnosis not present

## 2015-10-09 DIAGNOSIS — M545 Low back pain: Secondary | ICD-10-CM | POA: Diagnosis not present

## 2015-10-09 DIAGNOSIS — M62830 Muscle spasm of back: Secondary | ICD-10-CM | POA: Diagnosis not present

## 2015-10-11 DIAGNOSIS — M62830 Muscle spasm of back: Secondary | ICD-10-CM | POA: Diagnosis not present

## 2015-10-11 DIAGNOSIS — M545 Low back pain: Secondary | ICD-10-CM | POA: Diagnosis not present

## 2015-10-11 DIAGNOSIS — M9903 Segmental and somatic dysfunction of lumbar region: Secondary | ICD-10-CM | POA: Diagnosis not present

## 2015-10-11 DIAGNOSIS — M5136 Other intervertebral disc degeneration, lumbar region: Secondary | ICD-10-CM | POA: Diagnosis not present

## 2015-10-12 ENCOUNTER — Ambulatory Visit
Admission: RE | Admit: 2015-10-12 | Discharge: 2015-10-12 | Disposition: A | Discharge status: Home / Self Care | Payer: Medicare Other | Source: Ambulatory Visit | Attending: Geriatric Medicine | Admitting: Geriatric Medicine

## 2015-10-12 DIAGNOSIS — I7789 Other specified disorders of arteries and arterioles: Secondary | ICD-10-CM

## 2015-10-12 DIAGNOSIS — I712 Thoracic aortic aneurysm, without rupture: Secondary | ICD-10-CM | POA: Diagnosis not present

## 2015-10-12 MED ORDER — IOPAMIDOL (ISOVUE-370) INJECTION 76%
75.0000 mL | Freq: Once | INTRAVENOUS | Status: AC | PRN
  Administered 2015-10-12: 75 mL via INTRAVENOUS

## 2015-10-13 DIAGNOSIS — C44519 Basal cell carcinoma of skin of other part of trunk: Secondary | ICD-10-CM | POA: Diagnosis not present

## 2015-10-13 DIAGNOSIS — D485 Neoplasm of uncertain behavior of skin: Secondary | ICD-10-CM | POA: Diagnosis not present

## 2015-10-13 DIAGNOSIS — D1801 Hemangioma of skin and subcutaneous tissue: Secondary | ICD-10-CM | POA: Diagnosis not present

## 2015-10-13 DIAGNOSIS — L821 Other seborrheic keratosis: Secondary | ICD-10-CM | POA: Diagnosis not present

## 2015-10-13 DIAGNOSIS — C44619 Basal cell carcinoma of skin of left upper limb, including shoulder: Secondary | ICD-10-CM | POA: Diagnosis not present

## 2015-10-16 DIAGNOSIS — M9903 Segmental and somatic dysfunction of lumbar region: Secondary | ICD-10-CM | POA: Diagnosis not present

## 2015-10-16 DIAGNOSIS — Z23 Encounter for immunization: Secondary | ICD-10-CM | POA: Diagnosis not present

## 2015-10-16 DIAGNOSIS — M545 Low back pain: Secondary | ICD-10-CM | POA: Diagnosis not present

## 2015-10-16 DIAGNOSIS — M5136 Other intervertebral disc degeneration, lumbar region: Secondary | ICD-10-CM | POA: Diagnosis not present

## 2015-10-16 DIAGNOSIS — M62830 Muscle spasm of back: Secondary | ICD-10-CM | POA: Diagnosis not present

## 2015-10-19 DIAGNOSIS — M62830 Muscle spasm of back: Secondary | ICD-10-CM | POA: Diagnosis not present

## 2015-10-19 DIAGNOSIS — M9903 Segmental and somatic dysfunction of lumbar region: Secondary | ICD-10-CM | POA: Diagnosis not present

## 2015-10-19 DIAGNOSIS — M545 Low back pain: Secondary | ICD-10-CM | POA: Diagnosis not present

## 2015-10-19 DIAGNOSIS — M5136 Other intervertebral disc degeneration, lumbar region: Secondary | ICD-10-CM | POA: Diagnosis not present

## 2015-10-24 DIAGNOSIS — M9903 Segmental and somatic dysfunction of lumbar region: Secondary | ICD-10-CM | POA: Diagnosis not present

## 2015-10-24 DIAGNOSIS — M5136 Other intervertebral disc degeneration, lumbar region: Secondary | ICD-10-CM | POA: Diagnosis not present

## 2015-10-24 DIAGNOSIS — M62830 Muscle spasm of back: Secondary | ICD-10-CM | POA: Diagnosis not present

## 2015-10-24 DIAGNOSIS — M545 Low back pain: Secondary | ICD-10-CM | POA: Diagnosis not present

## 2015-10-26 DIAGNOSIS — M545 Low back pain: Secondary | ICD-10-CM | POA: Diagnosis not present

## 2015-10-26 DIAGNOSIS — M62830 Muscle spasm of back: Secondary | ICD-10-CM | POA: Diagnosis not present

## 2015-10-26 DIAGNOSIS — M5136 Other intervertebral disc degeneration, lumbar region: Secondary | ICD-10-CM | POA: Diagnosis not present

## 2015-10-26 DIAGNOSIS — M9903 Segmental and somatic dysfunction of lumbar region: Secondary | ICD-10-CM | POA: Diagnosis not present

## 2015-10-31 DIAGNOSIS — M545 Low back pain: Secondary | ICD-10-CM | POA: Diagnosis not present

## 2015-10-31 DIAGNOSIS — M62830 Muscle spasm of back: Secondary | ICD-10-CM | POA: Diagnosis not present

## 2015-10-31 DIAGNOSIS — M5136 Other intervertebral disc degeneration, lumbar region: Secondary | ICD-10-CM | POA: Diagnosis not present

## 2015-10-31 DIAGNOSIS — M9903 Segmental and somatic dysfunction of lumbar region: Secondary | ICD-10-CM | POA: Diagnosis not present

## 2015-11-08 ENCOUNTER — Encounter: Payer: Self-pay | Admitting: Cardiothoracic Surgery

## 2015-11-08 ENCOUNTER — Institutional Professional Consult (permissible substitution) (INDEPENDENT_AMBULATORY_CARE_PROVIDER_SITE_OTHER): Payer: Medicare Other | Admitting: Cardiothoracic Surgery

## 2015-11-08 VITALS — BP 140/100 | HR 80 | Resp 20 | Ht 64.0 in | Wt 165.0 lb

## 2015-11-08 DIAGNOSIS — I712 Thoracic aortic aneurysm, without rupture, unspecified: Secondary | ICD-10-CM

## 2015-11-08 NOTE — Progress Notes (Signed)
PCP is Mathews Argyle, MD Referring Provider is Lajean Manes, MD  Chief Complaint  Patient presents with  . 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 The patient has past history of mitral valve disease and underwent a recent echocardiogram to review the cardiac function. He has normal LV function. He had no significant mitral regurgitation urine he had no significant aortic valve disease. However the ascending aorta appeared to be enlarged on the transthoracic echo.  A CTA of the thoracic aorta demonstrated a maximum diameter 4.7 cm above the sinotubular junction. The arch and descending thoracic aorta appeared normal. There is no evidence of hematoma or penetrating ulcer of the ascending aorta.  The patient's ascending fusiform aneurysm is asymptomatic. There is no family history of aortic aneurysm of the thoracic or abdominal aorta. There is no family history of aortic dissection. The patient is short and heavyset and has no marfanoid characteristics.  The patient has had no previous CT scans of the chest with which to compare. He has been healthy and has not been hospitalized since childhood.  The patient did stop smoking 20 years ago. He does have hypertension but is taking 3 medications and is adequately controlled. His aortic valve on echo does not appear to be bicuspid. His risk factor for further aortic dilatation appears to be related to his hypertension. Past Medical History:  Diagnosis Date  . Diverticulitis   . Hyperglycemia   . Hypertension   . Inguinal hernia   . Macular degeneration   . Mitral regurgitation   . Umbilical hernia     Past  Surgical History:  Procedure Laterality Date  . CATARACT EXTRACTION    . IRRIGATION AND DEBRIDEMENT SEBACEOUS CYST      Family History  Problem Relation Age of Onset  . Hypertension Mother   . Stroke Mother   . Heart attack Mother   . Hypertension Father   . Alzheimer's disease Father   . Pneumonia Father   . Stroke Sister   . Alzheimer's disease Sister   . Congenital heart disease Brother     Social History Social History  Substance Use Topics  . Smoking status: Former Smoker    Years: 10.00    Types: Cigarettes    Quit date: 01/08/1968  . Smokeless tobacco: Not on file  . Alcohol use Yes     Comment: "very moderate"    Current Outpatient Prescriptions  Medication Sig Dispense Refill  . amLODipine (NORVASC) 5 MG tablet Take 5 mg by mouth daily.      . benazepril-hydrochlorthiazide (LOTENSIN HCT) 20-12.5 MG per tablet Take 1 tablet by mouth daily.      . dorzolamide-timolol (COSOPT) 22.3-6.8 MG/ML ophthalmic solution Place 1 drop into both eyes 2 (two) times daily.      . fish oil-omega-3 fatty acids 1000 MG capsule Take 3 g by mouth daily.      Marland Kitchen Lysine 1000 MG TABS Take 1 tablet by mouth daily.      . metoprolol (LOPRESSOR) 50 MG tablet Take 50 mg by mouth 2 (two) times daily.      . Multiple Vitamin (MULTIVITAMIN) tablet Take 1 tablet by mouth daily.      . Multiple Vitamins-Minerals (VISION-VITE  PRESERVE PO) Take 2 tablets by mouth daily.     No current facility-administered medications for this visit.     No Known Allergies  Review of Systems         Review of Systems :  [ y ] = yes, [  ] = no        General :  Weight gain [ stable but elevated  ]    Weight loss  [   ]  Fatigue [  ]  Fever [  ]  Chills  [  ]                                Weakness  [  ]           HEENT    Headache [  ]  Dizziness [  ]  Blurred vision [  ] Glaucoma  [  ]                          Nosebleeds [  ] Painful or loose teeth [  ]        Cardiac :  Chest pain/ pressure [  ]  Resting  SOB [  ] exertional SOB [  ]                        Orthopnea [  ]  Pedal edema  [  ]  Palpitations [  ] Syncope/presyncope [ ]                         Paroxysmal nocturnal dyspnea [  ]         Pulmonary : cough [  ]  wheezing [  ]  Hemoptysis [  ] Sputum [  ] Snoring [  ]                              Pneumothorax [  ]  Sleep apnea [  ]        GI : Vomiting [  ]  Dysphagia [  ]  Melena  [  ]  Abdominal pain [  ] BRBPR [  ]              Heart burn [  ]  Constipation [  ] Diarrhea  [  ] Colonoscopy [   ]        GU : Hematuria [  ]  Dysuria [  ]  Nocturia [ yes ] UTI's [  ]        Vascular : Claudication [  ]  Rest pain [  ]  DVT [  ] Vein stripping [  ] leg ulcers [  ]                          TIA [  ] Stroke [  ]  Varicose veins [  ]        NEURO :  Headaches  [  ] Seizures [  ] Vision changes [  ] Paresthesias [  ]  Seizures [  ]        Musculoskeletal :  Arthritis [  ] Gout  [  ]  Back pain [  ]  Joint pain [ yes ]        Skin :  Rash [  ]  Melanoma [  ] Sores [  ]        Heme : Bleeding problems [  ]Clotting Disorders [  ] Anemia [  ]Blood Transfusion [ ]         Endocrine : Diabetes [  ] Heat or Cold intolerance [  ] Polyuria [  ]excessive thirst [ ]         Psych : Depression [  ]  Anxiety [  ]  Psych hospitalizations [  ] Memory change [  ]         Right-hand dominant    No history of thoracic trauma rib fracture pneumothorax                                      BP (!) 140/100   Pulse 80   Resp 20   Ht 5\' 4"  (1.626 m)   Wt 165 lb (74.8 kg)   SpO2 97%   BMI 28.32 kg/m  Physical Exam      Physical Exam  General: Short heavyset Caucasian male who appears younger than his stated age, no distress HEENT: Normocephalic pupils equal , dentition adequate Neck: Supple without JVD, adenopathy, or bruit Chest: Clear to auscultation, symmetrical breath sounds, no rhonchi, no tenderness             or deformity Cardiovascular: Regular rate  and rhythm, no murmur, no gallop, peripheral pulses             palpable in all extremities Abdomen:  Soft, nontender, no palpable mass or organomegaly Extremities: Warm, well-perfused, no clubbing cyanosis edema or tenderness,              no venous stasis changes of the legs Rectal/GU: Deferred Neuro: Grossly non--focal and symmetrical throughout Skin: Clean and dry without rash or ulceration  Diagnostic Tests: CTA shows a fusiform ascending aneurysm measuring 4.7 cm maximal diameter The echocardiogram shows a similar measurement of the ascending aorta without aortic valve disease  Impression: Mild-moderate fusiform ascending aneurysm which is at low risk for dissection, less than 2% per year. Recommendation for aortic replacement for ascending aneurysm is 5.5 cm. The patient's best treatment is blood pressure control and careful monitoring of blood pressure and to prevent further weight gain. We will enter him into a annual CT scan surveillance to follow the ascending aneurysm for further enlargement.  If he develops sudden severe chest pain he knows to call [phone] for evaluation at the ER.   Plan:Return in one year with CT of thoracic aorta   Len Childs, MD [facility] [phone]

## 2016-01-10 DIAGNOSIS — J209 Acute bronchitis, unspecified: Secondary | ICD-10-CM | POA: Diagnosis not present

## 2016-01-10 DIAGNOSIS — J0120 Acute ethmoidal sinusitis, unspecified: Secondary | ICD-10-CM | POA: Diagnosis not present

## 2016-04-16 DIAGNOSIS — L821 Other seborrheic keratosis: Secondary | ICD-10-CM | POA: Diagnosis not present

## 2016-04-16 DIAGNOSIS — Z85828 Personal history of other malignant neoplasm of skin: Secondary | ICD-10-CM | POA: Diagnosis not present

## 2016-04-16 DIAGNOSIS — L918 Other hypertrophic disorders of the skin: Secondary | ICD-10-CM | POA: Diagnosis not present

## 2016-09-12 DIAGNOSIS — H40033 Anatomical narrow angle, bilateral: Secondary | ICD-10-CM | POA: Diagnosis not present

## 2016-10-30 ENCOUNTER — Other Ambulatory Visit: Payer: Self-pay | Admitting: *Deleted

## 2016-10-30 DIAGNOSIS — I712 Thoracic aortic aneurysm, without rupture, unspecified: Secondary | ICD-10-CM

## 2016-12-04 ENCOUNTER — Other Ambulatory Visit: Payer: Medicare Other

## 2016-12-04 ENCOUNTER — Ambulatory Visit: Payer: Medicare Other | Admitting: Cardiothoracic Surgery

## 2016-12-10 ENCOUNTER — Other Ambulatory Visit: Payer: Self-pay | Admitting: Geriatric Medicine

## 2016-12-10 DIAGNOSIS — I480 Paroxysmal atrial fibrillation: Secondary | ICD-10-CM

## 2016-12-12 ENCOUNTER — Other Ambulatory Visit: Payer: Self-pay

## 2016-12-12 ENCOUNTER — Ambulatory Visit (HOSPITAL_COMMUNITY): Payer: Medicare Other | Attending: Cardiovascular Disease

## 2016-12-12 DIAGNOSIS — I480 Paroxysmal atrial fibrillation: Secondary | ICD-10-CM | POA: Insufficient documentation

## 2016-12-12 DIAGNOSIS — I10 Essential (primary) hypertension: Secondary | ICD-10-CM | POA: Diagnosis not present

## 2016-12-12 DIAGNOSIS — I080 Rheumatic disorders of both mitral and aortic valves: Secondary | ICD-10-CM | POA: Insufficient documentation

## 2016-12-12 DIAGNOSIS — I719 Aortic aneurysm of unspecified site, without rupture: Secondary | ICD-10-CM | POA: Insufficient documentation

## 2016-12-18 ENCOUNTER — Ambulatory Visit: Payer: Medicare Other | Admitting: Cardiothoracic Surgery

## 2016-12-18 ENCOUNTER — Other Ambulatory Visit: Payer: Medicare Other

## 2016-12-25 ENCOUNTER — Ambulatory Visit
Admission: RE | Admit: 2016-12-25 | Discharge: 2016-12-25 | Disposition: A | Discharge status: Home / Self Care | Payer: Medicare Other | Source: Ambulatory Visit | Attending: Cardiothoracic Surgery | Admitting: Cardiothoracic Surgery

## 2016-12-25 DIAGNOSIS — I712 Thoracic aortic aneurysm, without rupture, unspecified: Secondary | ICD-10-CM

## 2016-12-25 MED ORDER — IOPAMIDOL (ISOVUE-370) INJECTION 76%
75.0000 mL | Freq: Once | INTRAVENOUS | Status: AC | PRN
  Administered 2016-12-25: 75 mL via INTRAVENOUS

## 2017-01-15 ENCOUNTER — Ambulatory Visit: Payer: Medicare Other | Admitting: Cardiothoracic Surgery

## 2017-01-15 ENCOUNTER — Encounter: Payer: Self-pay | Admitting: Cardiothoracic Surgery

## 2017-01-15 ENCOUNTER — Other Ambulatory Visit: Payer: Self-pay

## 2017-01-15 VITALS — BP 115/87 | HR 72 | Resp 18 | Wt 176.0 lb

## 2017-01-15 DIAGNOSIS — I712 Thoracic aortic aneurysm, without rupture, unspecified: Secondary | ICD-10-CM

## 2017-01-15 NOTE — Progress Notes (Signed)
PCP is Lajean Manes, MD Referring Provider is Lajean Manes, MD  Chief Complaint  Patient presents with  . Follow-up    48yr f/u thoracic aortic aneurysm, CT 12/25/2016    HPI: Annual review with CT scan of chest. Patient has history of a chronic asymptomatic mild-moderate ascending thoracic fusiform aneurysm 4.4 cm. He has history of hypertension. He is a nonsmoker. There is negative family history of thoracic aortic disease.  The patient continues to do well without chest pain. In December 2018 He was diagnosed with atrial fibrillation and was started on Eliquis. An echocardiogram at that time showed normal LV function, no aortic valve dysfunction, stable mildly elevated aorta  CTA images personally reviewed and discussed with patient. The ascending aorta remained stable at 4.4 cm diameter There is no mural thickening or ulceration. Pulmonary window show no significant primary pulmonary disease  The patient is at low risk for aortic dissection and should continue his antihypertensive therapy with metoprolol amlodipine and Lotensin  Past Medical History:  Diagnosis Date  . Diverticulitis   . Hyperglycemia   . Hypertension   . Inguinal hernia   . Macular degeneration   . Mitral regurgitation   . Umbilical hernia     Past Surgical History:  Procedure Laterality Date  . CATARACT EXTRACTION    . IRRIGATION AND DEBRIDEMENT SEBACEOUS CYST      Family History  Problem Relation Age of Onset  . Hypertension Mother   . Stroke Mother   . Heart attack Mother   . Hypertension Father   . Alzheimer's disease Father   . Pneumonia Father   . Stroke Sister   . Alzheimer's disease Sister   . Congenital heart disease Brother     Social History Social History   Tobacco Use  . Smoking status: Former Smoker    Years: 10.00    Types: Cigarettes    Last attempt to quit: 01/08/1968    Years since quitting: 49.0  . Smokeless tobacco: Never Used  Substance Use Topics  . Alcohol  use: Yes    Comment: "very moderate"  . Drug use: No    Current Outpatient Medications  Medication Sig Dispense Refill  . amLODipine (NORVASC) 5 MG tablet Take 5 mg by mouth daily.      Marland Kitchen Apixaban (ELIQUIS PO) Take 1 tablet by mouth.    . benazepril-hydrochlorthiazide (LOTENSIN HCT) 20-12.5 MG per tablet Take 1 tablet by mouth daily.      . dorzolamide-timolol (COSOPT) 22.3-6.8 MG/ML ophthalmic solution Place 1 drop into both eyes 2 (two) times daily.      . fish oil-omega-3 fatty acids 1000 MG capsule Take 3 g by mouth daily.      Marland Kitchen Lysine 1000 MG TABS Take 1 tablet by mouth daily.      . metoprolol (LOPRESSOR) 50 MG tablet Take 50 mg by mouth 2 (two) times daily.      . Multiple Vitamin (MULTIVITAMIN) tablet Take 1 tablet by mouth daily.      . Multiple Vitamins-Minerals (VISION-VITE PRESERVE PO) Take 2 tablets by mouth daily.     No current facility-administered medications for this visit.     No Known Allergies  Review of Systems  No chest pain No syncope No TIA No ankle edema No fever No weight gain or weight loss  BP 115/87 (BP Location: Right Arm, Patient Position: Sitting, Cuff Size: Large)   Pulse 72   Resp 18   Wt 176 lb (79.8 kg)   SpO2  97% Comment: RA  BMI 30.21 kg/m  Physical Exam      Exam    General- alert and comfortable   Lungs- clear without rales, wheezes   Cor- regular rate and rhythm, no murmur , gallop   Abdomen- soft, non-tender   Extremities - warm, non-tender, minimal edema   Neuro- oriented, appropriate, no focal weakness   Diagnostic Tests: CT scan images personally reviewed and counseled with patient  Impression: Stable mild fusiform dilatation of the ascending aorta No change in  2 years. Patient's best plan is close blood pressure monitoring and control. Will plan repeat surveillance CT scan in 2 years. His current risk for aortic dissection would be less than 2%.  Plan: Return with CT chest in 2 years. Now the patient is on  Eliquis he should monitor his blood pressure carefully  Len Childs, MD [facility] [phone]

## 2017-01-16 DIAGNOSIS — M5136 Other intervertebral disc degeneration, lumbar region: Secondary | ICD-10-CM | POA: Diagnosis not present

## 2017-01-16 DIAGNOSIS — M62830 Muscle spasm of back: Secondary | ICD-10-CM | POA: Diagnosis not present

## 2017-01-16 DIAGNOSIS — M9903 Segmental and somatic dysfunction of lumbar region: Secondary | ICD-10-CM | POA: Diagnosis not present

## 2017-01-16 DIAGNOSIS — M545 Low back pain: Secondary | ICD-10-CM | POA: Diagnosis not present

## 2017-01-30 DIAGNOSIS — M9903 Segmental and somatic dysfunction of lumbar region: Secondary | ICD-10-CM | POA: Diagnosis not present

## 2017-01-30 DIAGNOSIS — M5136 Other intervertebral disc degeneration, lumbar region: Secondary | ICD-10-CM | POA: Diagnosis not present

## 2017-01-30 DIAGNOSIS — M545 Low back pain: Secondary | ICD-10-CM | POA: Diagnosis not present

## 2017-01-30 DIAGNOSIS — M62830 Muscle spasm of back: Secondary | ICD-10-CM | POA: Diagnosis not present

## 2017-02-13 DIAGNOSIS — M545 Low back pain: Secondary | ICD-10-CM | POA: Diagnosis not present

## 2017-02-13 DIAGNOSIS — M62830 Muscle spasm of back: Secondary | ICD-10-CM | POA: Diagnosis not present

## 2017-02-13 DIAGNOSIS — M5136 Other intervertebral disc degeneration, lumbar region: Secondary | ICD-10-CM | POA: Diagnosis not present

## 2017-02-13 DIAGNOSIS — M9903 Segmental and somatic dysfunction of lumbar region: Secondary | ICD-10-CM | POA: Diagnosis not present

## 2017-02-27 DIAGNOSIS — M62830 Muscle spasm of back: Secondary | ICD-10-CM | POA: Diagnosis not present

## 2017-02-27 DIAGNOSIS — M9903 Segmental and somatic dysfunction of lumbar region: Secondary | ICD-10-CM | POA: Diagnosis not present

## 2017-02-27 DIAGNOSIS — M545 Low back pain: Secondary | ICD-10-CM | POA: Diagnosis not present

## 2017-02-27 DIAGNOSIS — M5136 Other intervertebral disc degeneration, lumbar region: Secondary | ICD-10-CM | POA: Diagnosis not present

## 2017-03-11 DIAGNOSIS — M545 Low back pain: Secondary | ICD-10-CM | POA: Diagnosis not present

## 2017-03-11 DIAGNOSIS — M5136 Other intervertebral disc degeneration, lumbar region: Secondary | ICD-10-CM | POA: Diagnosis not present

## 2017-03-11 DIAGNOSIS — M9903 Segmental and somatic dysfunction of lumbar region: Secondary | ICD-10-CM | POA: Diagnosis not present

## 2017-03-11 DIAGNOSIS — M62830 Muscle spasm of back: Secondary | ICD-10-CM | POA: Diagnosis not present

## 2017-03-27 DIAGNOSIS — M62830 Muscle spasm of back: Secondary | ICD-10-CM | POA: Diagnosis not present

## 2017-03-27 DIAGNOSIS — M9903 Segmental and somatic dysfunction of lumbar region: Secondary | ICD-10-CM | POA: Diagnosis not present

## 2017-03-27 DIAGNOSIS — M5136 Other intervertebral disc degeneration, lumbar region: Secondary | ICD-10-CM | POA: Diagnosis not present

## 2017-03-27 DIAGNOSIS — M545 Low back pain: Secondary | ICD-10-CM | POA: Diagnosis not present

## 2017-04-08 DIAGNOSIS — M62830 Muscle spasm of back: Secondary | ICD-10-CM | POA: Diagnosis not present

## 2017-04-08 DIAGNOSIS — M545 Low back pain: Secondary | ICD-10-CM | POA: Diagnosis not present

## 2017-04-08 DIAGNOSIS — M5136 Other intervertebral disc degeneration, lumbar region: Secondary | ICD-10-CM | POA: Diagnosis not present

## 2017-04-08 DIAGNOSIS — M9903 Segmental and somatic dysfunction of lumbar region: Secondary | ICD-10-CM | POA: Diagnosis not present

## 2017-12-01 DIAGNOSIS — M5136 Other intervertebral disc degeneration, lumbar region: Secondary | ICD-10-CM | POA: Diagnosis not present

## 2017-12-01 DIAGNOSIS — M545 Low back pain: Secondary | ICD-10-CM | POA: Diagnosis not present

## 2017-12-01 DIAGNOSIS — M9903 Segmental and somatic dysfunction of lumbar region: Secondary | ICD-10-CM | POA: Diagnosis not present

## 2017-12-01 DIAGNOSIS — M62830 Muscle spasm of back: Secondary | ICD-10-CM | POA: Diagnosis not present

## 2017-12-08 DIAGNOSIS — M9903 Segmental and somatic dysfunction of lumbar region: Secondary | ICD-10-CM | POA: Diagnosis not present

## 2017-12-08 DIAGNOSIS — M545 Low back pain: Secondary | ICD-10-CM | POA: Diagnosis not present

## 2017-12-08 DIAGNOSIS — M62830 Muscle spasm of back: Secondary | ICD-10-CM | POA: Diagnosis not present

## 2017-12-08 DIAGNOSIS — M5136 Other intervertebral disc degeneration, lumbar region: Secondary | ICD-10-CM | POA: Diagnosis not present

## 2017-12-10 DIAGNOSIS — M62830 Muscle spasm of back: Secondary | ICD-10-CM | POA: Diagnosis not present

## 2017-12-10 DIAGNOSIS — M5136 Other intervertebral disc degeneration, lumbar region: Secondary | ICD-10-CM | POA: Diagnosis not present

## 2017-12-10 DIAGNOSIS — M545 Low back pain: Secondary | ICD-10-CM | POA: Diagnosis not present

## 2017-12-10 DIAGNOSIS — M9903 Segmental and somatic dysfunction of lumbar region: Secondary | ICD-10-CM | POA: Diagnosis not present

## 2018-12-22 ENCOUNTER — Other Ambulatory Visit: Payer: Self-pay | Admitting: *Deleted

## 2018-12-22 DIAGNOSIS — I712 Thoracic aortic aneurysm, without rupture, unspecified: Secondary | ICD-10-CM

## 2019-01-21 ENCOUNTER — Other Ambulatory Visit: Payer: Self-pay | Admitting: *Deleted

## 2019-01-21 DIAGNOSIS — I712 Thoracic aortic aneurysm, without rupture, unspecified: Secondary | ICD-10-CM

## 2019-01-21 NOTE — Progress Notes (Unsigned)
un 

## 2019-01-27 ENCOUNTER — Inpatient Hospital Stay: Admission: RE | Admit: 2019-01-27 | Payer: Medicare Other | Source: Ambulatory Visit

## 2019-01-27 ENCOUNTER — Telehealth: Payer: Medicare Other | Admitting: Cardiothoracic Surgery

## 2019-02-06 ENCOUNTER — Ambulatory Visit: Payer: Medicare Other

## 2019-06-30 ENCOUNTER — Other Ambulatory Visit: Payer: Self-pay | Admitting: Cardiothoracic Surgery

## 2019-06-30 DIAGNOSIS — I712 Thoracic aortic aneurysm, without rupture, unspecified: Secondary | ICD-10-CM

## 2019-07-21 ENCOUNTER — Ambulatory Visit
Admission: RE | Admit: 2019-07-21 | Discharge: 2019-07-21 | Disposition: A | Discharge status: Home / Self Care | Payer: Medicare PPO | Source: Ambulatory Visit | Attending: Cardiothoracic Surgery | Admitting: Cardiothoracic Surgery

## 2019-07-21 ENCOUNTER — Encounter: Payer: Medicare PPO | Admitting: Cardiothoracic Surgery

## 2019-07-21 ENCOUNTER — Other Ambulatory Visit: Payer: Self-pay

## 2019-07-21 ENCOUNTER — Telehealth: Payer: Self-pay | Admitting: Cardiothoracic Surgery

## 2019-07-21 ENCOUNTER — Ambulatory Visit: Payer: Medicare PPO | Admitting: Cardiothoracic Surgery

## 2019-07-21 ENCOUNTER — Telehealth: Payer: Medicare PPO | Admitting: Cardiothoracic Surgery

## 2019-07-21 VITALS — BP 140/90 | HR 88 | Temp 97.9°F | Resp 20 | Ht 64.0 in | Wt 175.0 lb

## 2019-07-21 DIAGNOSIS — I712 Thoracic aortic aneurysm, without rupture, unspecified: Secondary | ICD-10-CM

## 2019-07-21 MED ORDER — IOPAMIDOL (ISOVUE-370) INJECTION 76%
75.0000 mL | Freq: Once | INTRAVENOUS | Status: AC | PRN
  Administered 2019-07-21: 75 mL via INTRAVENOUS

## 2019-07-21 NOTE — Telephone Encounter (Signed)
[address]       [place] [address]             [phone]     CARDIOTHORACIC SURGERY TELEPHONE VIRTUAL OFFICE NOTE  Referring Provider is No ref. provider found Primary Cardiologist is No primary care provider on file. PCP is Lajean Manes, MD   HPI:  I spoke with Barry Contreras. (DOB 11-19-1931 ) via telephone on 07/21/2019 at 1:02 PM and verified that I was speaking with the correct person using more than one form of identification.  We discussed the reason(s) for conducting our visit virtually instead of in-person.  The patient expressed understanding the circumstances and agreed to proceed as described.  The patient return for follow-up of an asymptomatic mild to moderate fusiform ascending thoracic aneurysm first noted in 2017 measured 4.5 cm.  The patient has hypertension but is compliant with his antihypertensive medications including Norvasc, Cardizem I am, metoprolol, Lotensin.  He has had no chest pain or episodes of high blood pressure.  He remains on Eliquis for atrial fibrillation  I reviewed the CTA scan of his chest performed today which shows no change in his ascending aorta.  I measure a diameter 4.4 cm.  The wall of the ascending aorta is smooth without ulceration or hematoma.  Final report of the lung windows and mediastinal nodes is pending due to a problem with the radiology imaging system.   Current Outpatient Medications  Medication Sig Dispense Refill  . amLODipine (NORVASC) 5 MG tablet Take 5 mg by mouth daily.   (Patient not taking: Reported on 07/21/2019)    . Apixaban (ELIQUIS PO) Take 1 tablet by mouth every morning.     . benazepril-hydrochlorthiazide (LOTENSIN HCT) 20-12.5 MG per tablet Take 1 tablet by mouth daily.      Marland Kitchen CARDIZEM LA 120 MG 24 hr tablet Take 120 mg by mouth daily.     . dorzolamide-timolol (COSOPT) 22.3-6.8 MG/ML ophthalmic solution Place 1 drop into both eyes 2 (two) times daily.      . fish oil-omega-3 fatty  acids 1000 MG capsule Take 3 g by mouth daily.      Marland Kitchen Lysine 1000 MG TABS Take 1 tablet by mouth daily.      . metoprolol (LOPRESSOR) 50 MG tablet Take 50 mg by mouth 2 (two) times daily.      . Multiple Vitamin (MULTIVITAMIN) tablet Take 1 tablet by mouth daily.      . Multiple Vitamins-Minerals (VISION-VITE PRESERVE PO) Take 2 tablets by mouth daily.     No current facility-administered medications for this visit.     Diagnostic Tests:  CT scan performed today personally reviewed with results as noted above.  No change in the moderate fusiform ascending aneurysm   Impression:  Patient understands he is at low risk for tear of his ascending aorta/aortic dissection with a stable size and well-controlled blood pressure.  Risk of dissection would be less than 2%.  Plan:  Best therapy is continued blood pressure control to keep systolic pressure less than 140.  He is encouraged to remain compliant with his blood pressure medications and monitor his blood pressure at home.  We will make arrangements for him to have a repeat scan in 2 years.    I discussed limitations of evaluation and management via telephone.  The patient was advised to call back for repeat telephone consultation or to seek an in-person evaluation if questions arise or the  patient's clinical condition changes in any significant manner.  I spent in excess of 5 minutes of non-face-to-face time during the conduct of this telephone virtual office consultation.  Level 1  (99441)             5-10 minutes Level 2  (99442)            11-20 minutes Level 3  (99443)            21-30 minutes    07/21/2019 1:02 PM

## 2019-08-23 DIAGNOSIS — I10 Essential (primary) hypertension: Secondary | ICD-10-CM | POA: Diagnosis not present

## 2019-08-23 DIAGNOSIS — D6869 Other thrombophilia: Secondary | ICD-10-CM | POA: Diagnosis not present

## 2019-08-23 DIAGNOSIS — H6982 Other specified disorders of Eustachian tube, left ear: Secondary | ICD-10-CM | POA: Diagnosis not present

## 2019-08-23 DIAGNOSIS — I480 Paroxysmal atrial fibrillation: Secondary | ICD-10-CM | POA: Diagnosis not present

## 2019-08-23 DIAGNOSIS — I712 Thoracic aortic aneurysm, without rupture: Secondary | ICD-10-CM | POA: Diagnosis not present

## 2019-09-27 DIAGNOSIS — H353132 Nonexudative age-related macular degeneration, bilateral, intermediate dry stage: Secondary | ICD-10-CM | POA: Diagnosis not present

## 2019-09-27 DIAGNOSIS — Z961 Presence of intraocular lens: Secondary | ICD-10-CM | POA: Diagnosis not present

## 2019-09-27 DIAGNOSIS — H47323 Drusen of optic disc, bilateral: Secondary | ICD-10-CM | POA: Diagnosis not present

## 2019-09-27 DIAGNOSIS — H40023 Open angle with borderline findings, high risk, bilateral: Secondary | ICD-10-CM | POA: Diagnosis not present

## 2019-10-12 DIAGNOSIS — L821 Other seborrheic keratosis: Secondary | ICD-10-CM | POA: Diagnosis not present

## 2019-10-12 DIAGNOSIS — L570 Actinic keratosis: Secondary | ICD-10-CM | POA: Diagnosis not present

## 2019-10-12 DIAGNOSIS — Z85828 Personal history of other malignant neoplasm of skin: Secondary | ICD-10-CM | POA: Diagnosis not present

## 2019-10-12 DIAGNOSIS — L812 Freckles: Secondary | ICD-10-CM | POA: Diagnosis not present

## 2019-10-12 DIAGNOSIS — L720 Epidermal cyst: Secondary | ICD-10-CM | POA: Diagnosis not present

## 2019-11-08 DIAGNOSIS — L03012 Cellulitis of left finger: Secondary | ICD-10-CM | POA: Diagnosis not present

## 2019-11-15 DIAGNOSIS — L03012 Cellulitis of left finger: Secondary | ICD-10-CM | POA: Diagnosis not present

## 2020-01-12 DIAGNOSIS — Z79899 Other long term (current) drug therapy: Secondary | ICD-10-CM | POA: Diagnosis not present

## 2020-01-12 DIAGNOSIS — R7309 Other abnormal glucose: Secondary | ICD-10-CM | POA: Diagnosis not present

## 2020-01-12 DIAGNOSIS — Z0000 Encounter for general adult medical examination without abnormal findings: Secondary | ICD-10-CM | POA: Diagnosis not present

## 2020-01-12 DIAGNOSIS — I340 Nonrheumatic mitral (valve) insufficiency: Secondary | ICD-10-CM | POA: Diagnosis not present

## 2020-01-12 DIAGNOSIS — Z1389 Encounter for screening for other disorder: Secondary | ICD-10-CM | POA: Diagnosis not present

## 2020-01-12 DIAGNOSIS — I700 Atherosclerosis of aorta: Secondary | ICD-10-CM | POA: Diagnosis not present

## 2020-01-12 DIAGNOSIS — I10 Essential (primary) hypertension: Secondary | ICD-10-CM | POA: Diagnosis not present

## 2020-01-12 DIAGNOSIS — I7789 Other specified disorders of arteries and arterioles: Secondary | ICD-10-CM | POA: Diagnosis not present

## 2020-01-12 DIAGNOSIS — I480 Paroxysmal atrial fibrillation: Secondary | ICD-10-CM | POA: Diagnosis not present

## 2020-05-08 DIAGNOSIS — L720 Epidermal cyst: Secondary | ICD-10-CM | POA: Diagnosis not present

## 2020-05-08 DIAGNOSIS — L570 Actinic keratosis: Secondary | ICD-10-CM | POA: Diagnosis not present

## 2020-05-08 DIAGNOSIS — Z85828 Personal history of other malignant neoplasm of skin: Secondary | ICD-10-CM | POA: Diagnosis not present

## 2020-05-29 ENCOUNTER — Other Ambulatory Visit: Payer: Self-pay | Admitting: Geriatric Medicine

## 2020-05-29 ENCOUNTER — Ambulatory Visit
Admission: RE | Admit: 2020-05-29 | Discharge: 2020-05-29 | Disposition: A | Discharge status: Home / Self Care | Payer: Medicare PPO | Source: Ambulatory Visit | Attending: Geriatric Medicine | Admitting: Geriatric Medicine

## 2020-05-29 DIAGNOSIS — D6869 Other thrombophilia: Secondary | ICD-10-CM | POA: Diagnosis not present

## 2020-05-29 DIAGNOSIS — I480 Paroxysmal atrial fibrillation: Secondary | ICD-10-CM | POA: Diagnosis not present

## 2020-05-29 DIAGNOSIS — R1011 Right upper quadrant pain: Secondary | ICD-10-CM

## 2020-05-29 DIAGNOSIS — N281 Cyst of kidney, acquired: Secondary | ICD-10-CM | POA: Diagnosis not present

## 2020-05-29 MED ORDER — IOPAMIDOL (ISOVUE-300) INJECTION 61%
100.0000 mL | Freq: Once | INTRAVENOUS | Status: AC | PRN
  Administered 2020-05-29: 100 mL via INTRAVENOUS

## 2020-06-01 ENCOUNTER — Other Ambulatory Visit: Payer: Self-pay | Admitting: Geriatric Medicine

## 2020-06-01 DIAGNOSIS — K869 Disease of pancreas, unspecified: Secondary | ICD-10-CM

## 2020-06-07 DIAGNOSIS — K869 Disease of pancreas, unspecified: Secondary | ICD-10-CM | POA: Diagnosis not present

## 2020-06-07 DIAGNOSIS — I340 Nonrheumatic mitral (valve) insufficiency: Secondary | ICD-10-CM | POA: Diagnosis not present

## 2020-06-17 ENCOUNTER — Ambulatory Visit
Admission: RE | Admit: 2020-06-17 | Discharge: 2020-06-17 | Disposition: A | Discharge status: Home / Self Care | Payer: Medicare PPO | Source: Ambulatory Visit | Attending: Geriatric Medicine | Admitting: Geriatric Medicine

## 2020-06-17 ENCOUNTER — Other Ambulatory Visit: Payer: Self-pay

## 2020-06-17 DIAGNOSIS — K869 Disease of pancreas, unspecified: Secondary | ICD-10-CM

## 2020-06-17 DIAGNOSIS — K449 Diaphragmatic hernia without obstruction or gangrene: Secondary | ICD-10-CM | POA: Diagnosis not present

## 2020-06-17 DIAGNOSIS — K828 Other specified diseases of gallbladder: Secondary | ICD-10-CM | POA: Diagnosis not present

## 2020-06-17 DIAGNOSIS — N281 Cyst of kidney, acquired: Secondary | ICD-10-CM | POA: Diagnosis not present

## 2020-06-17 DIAGNOSIS — K7689 Other specified diseases of liver: Secondary | ICD-10-CM | POA: Diagnosis not present

## 2020-06-17 MED ORDER — GADOBENATE DIMEGLUMINE 529 MG/ML IV SOLN
15.0000 mL | Freq: Once | INTRAVENOUS | Status: AC | PRN
  Administered 2020-06-17: 15 mL via INTRAVENOUS

## 2020-06-21 DIAGNOSIS — K8689 Other specified diseases of pancreas: Secondary | ICD-10-CM | POA: Diagnosis not present

## 2020-06-28 DIAGNOSIS — I480 Paroxysmal atrial fibrillation: Secondary | ICD-10-CM | POA: Diagnosis not present

## 2020-06-28 DIAGNOSIS — R17 Unspecified jaundice: Secondary | ICD-10-CM | POA: Diagnosis not present

## 2020-06-28 DIAGNOSIS — K8689 Other specified diseases of pancreas: Secondary | ICD-10-CM | POA: Diagnosis not present

## 2020-06-29 ENCOUNTER — Other Ambulatory Visit: Payer: Self-pay | Admitting: Gastroenterology

## 2020-06-30 NOTE — Progress Notes (Signed)
Attempted to obtain medical history via telephone, unable to reach at this time and unable to leave a voicemail.Marland Kitchen

## 2020-07-04 ENCOUNTER — Other Ambulatory Visit: Payer: Self-pay

## 2020-07-04 ENCOUNTER — Ambulatory Visit (HOSPITAL_COMMUNITY): Payer: Medicare PPO

## 2020-07-04 ENCOUNTER — Encounter (HOSPITAL_COMMUNITY)
Admission: RE | Disposition: A | Discharge status: Home / Self Care | Payer: Self-pay | Source: Home / Self Care | Attending: Gastroenterology

## 2020-07-04 ENCOUNTER — Ambulatory Visit (HOSPITAL_COMMUNITY)
Admission: RE | Admit: 2020-07-04 | Discharge: 2020-07-04 | Disposition: A | Discharge status: Home / Self Care | Payer: Medicare PPO | Attending: Gastroenterology | Admitting: Gastroenterology

## 2020-07-04 ENCOUNTER — Ambulatory Visit (HOSPITAL_COMMUNITY): Payer: Medicare PPO | Admitting: Certified Registered Nurse Anesthetist

## 2020-07-04 DIAGNOSIS — Z823 Family history of stroke: Secondary | ICD-10-CM | POA: Insufficient documentation

## 2020-07-04 DIAGNOSIS — I899 Noninfective disorder of lymphatic vessels and lymph nodes, unspecified: Secondary | ICD-10-CM | POA: Diagnosis not present

## 2020-07-04 DIAGNOSIS — K4090 Unilateral inguinal hernia, without obstruction or gangrene, not specified as recurrent: Secondary | ICD-10-CM | POA: Diagnosis not present

## 2020-07-04 DIAGNOSIS — C250 Malignant neoplasm of head of pancreas: Secondary | ICD-10-CM | POA: Diagnosis not present

## 2020-07-04 DIAGNOSIS — Z7901 Long term (current) use of anticoagulants: Secondary | ICD-10-CM | POA: Diagnosis not present

## 2020-07-04 DIAGNOSIS — K8689 Other specified diseases of pancreas: Secondary | ICD-10-CM | POA: Diagnosis not present

## 2020-07-04 DIAGNOSIS — R748 Abnormal levels of other serum enzymes: Secondary | ICD-10-CM | POA: Diagnosis not present

## 2020-07-04 DIAGNOSIS — Z87891 Personal history of nicotine dependence: Secondary | ICD-10-CM | POA: Diagnosis not present

## 2020-07-04 DIAGNOSIS — I10 Essential (primary) hypertension: Secondary | ICD-10-CM | POA: Diagnosis not present

## 2020-07-04 DIAGNOSIS — R933 Abnormal findings on diagnostic imaging of other parts of digestive tract: Secondary | ICD-10-CM | POA: Diagnosis not present

## 2020-07-04 DIAGNOSIS — Z820 Family history of epilepsy and other diseases of the nervous system: Secondary | ICD-10-CM | POA: Insufficient documentation

## 2020-07-04 DIAGNOSIS — Z79899 Other long term (current) drug therapy: Secondary | ICD-10-CM | POA: Insufficient documentation

## 2020-07-04 DIAGNOSIS — H3530 Unspecified macular degeneration: Secondary | ICD-10-CM | POA: Insufficient documentation

## 2020-07-04 DIAGNOSIS — K831 Obstruction of bile duct: Secondary | ICD-10-CM | POA: Diagnosis not present

## 2020-07-04 DIAGNOSIS — K838 Other specified diseases of biliary tract: Secondary | ICD-10-CM | POA: Diagnosis not present

## 2020-07-04 DIAGNOSIS — Z8249 Family history of ischemic heart disease and other diseases of the circulatory system: Secondary | ICD-10-CM | POA: Diagnosis not present

## 2020-07-04 DIAGNOSIS — T85590A Other mechanical complication of bile duct prosthesis, initial encounter: Secondary | ICD-10-CM

## 2020-07-04 DIAGNOSIS — Z48815 Encounter for surgical aftercare following surgery on the digestive system: Secondary | ICD-10-CM | POA: Diagnosis not present

## 2020-07-04 DIAGNOSIS — C259 Malignant neoplasm of pancreas, unspecified: Secondary | ICD-10-CM | POA: Diagnosis not present

## 2020-07-04 DIAGNOSIS — I340 Nonrheumatic mitral (valve) insufficiency: Secondary | ICD-10-CM | POA: Diagnosis not present

## 2020-07-04 DIAGNOSIS — R932 Abnormal findings on diagnostic imaging of liver and biliary tract: Secondary | ICD-10-CM | POA: Diagnosis not present

## 2020-07-04 HISTORY — PX: SPHINCTEROTOMY: SHX5544

## 2020-07-04 HISTORY — PX: FINE NEEDLE ASPIRATION: SHX5430

## 2020-07-04 HISTORY — PX: EUS: SHX5427

## 2020-07-04 HISTORY — PX: BILIARY STENT PLACEMENT: SHX5538

## 2020-07-04 HISTORY — PX: ERCP: SHX5425

## 2020-07-04 SURGERY — ERCP, WITH INTERVENTION IF INDICATED
Anesthesia: General

## 2020-07-04 MED ORDER — SUGAMMADEX SODIUM 200 MG/2ML IV SOLN
INTRAVENOUS | Status: DC | PRN
  Administered 2020-07-04: 40 mg via INTRAVENOUS
  Administered 2020-07-04: 200 mg via INTRAVENOUS

## 2020-07-04 MED ORDER — SODIUM CHLORIDE 0.9 % IV SOLN: INTRAVENOUS | Status: DC

## 2020-07-04 MED ORDER — PHENYLEPHRINE HCL (PRESSORS) 10 MG/ML IV SOLN
INTRAVENOUS | Status: AC
  Filled 2020-07-04: qty 1

## 2020-07-04 MED ORDER — PROPOFOL 500 MG/50ML IV EMUL
INTRAVENOUS | Status: AC
  Filled 2020-07-04: qty 200

## 2020-07-04 MED ORDER — INDOMETHACIN 50 MG RE SUPP
RECTAL | Status: DC | PRN
  Administered 2020-07-04: 100 mg via RECTAL

## 2020-07-04 MED ORDER — ESMOLOL HCL 100 MG/10ML IV SOLN
INTRAVENOUS | Status: DC | PRN
  Administered 2020-07-04 (×2): 20 mg via INTRAVENOUS
  Administered 2020-07-04 (×2): 30 mg via INTRAVENOUS
  Administered 2020-07-04: 40 mg via INTRAVENOUS
  Administered 2020-07-04: 20 mg via INTRAVENOUS

## 2020-07-04 MED ORDER — EPINEPHRINE 1 MG/10ML IJ SOSY
PREFILLED_SYRINGE | INTRAMUSCULAR | Status: AC
  Filled 2020-07-04: qty 10

## 2020-07-04 MED ORDER — SODIUM CHLORIDE 0.9 % IV SOLN
INTRAVENOUS | Status: DC | PRN
  Administered 2020-07-04: 30 mL

## 2020-07-04 MED ORDER — PHENYLEPHRINE HCL-NACL 10-0.9 MG/250ML-% IV SOLN
INTRAVENOUS | Status: DC | PRN
  Administered 2020-07-04: 60 ug/min via INTRAVENOUS

## 2020-07-04 MED ORDER — LIDOCAINE 2% (20 MG/ML) 5 ML SYRINGE
INTRAMUSCULAR | Status: DC | PRN
  Administered 2020-07-04: 100 mg via INTRAVENOUS

## 2020-07-04 MED ORDER — LACTATED RINGERS IV SOLN: INTRAVENOUS | Status: DC

## 2020-07-04 MED ORDER — GLUCAGON HCL RDNA (DIAGNOSTIC) 1 MG IJ SOLR
INTRAMUSCULAR | Status: AC
  Filled 2020-07-04: qty 1

## 2020-07-04 MED ORDER — CIPROFLOXACIN IN D5W 400 MG/200ML IV SOLN
INTRAVENOUS | Status: AC
  Filled 2020-07-04: qty 200

## 2020-07-04 MED ORDER — PROPOFOL 1000 MG/100ML IV EMUL
INTRAVENOUS | Status: AC
  Filled 2020-07-04: qty 100

## 2020-07-04 MED ORDER — ROCURONIUM BROMIDE 10 MG/ML (PF) SYRINGE
PREFILLED_SYRINGE | INTRAVENOUS | Status: DC | PRN
  Administered 2020-07-04: 60 mg via INTRAVENOUS

## 2020-07-04 MED ORDER — INDOMETHACIN 50 MG RE SUPP
RECTAL | Status: AC
  Filled 2020-07-04: qty 2

## 2020-07-04 MED ORDER — PROPOFOL 10 MG/ML IV BOLUS
INTRAVENOUS | Status: DC | PRN
  Administered 2020-07-04: 140 mg via INTRAVENOUS

## 2020-07-04 MED ORDER — DEXAMETHASONE SODIUM PHOSPHATE 10 MG/ML IJ SOLN
INTRAMUSCULAR | Status: DC | PRN
  Administered 2020-07-04: 4 mg via INTRAVENOUS

## 2020-07-04 MED ORDER — PROPOFOL 10 MG/ML IV BOLUS
INTRAVENOUS | Status: AC
  Filled 2020-07-04: qty 20

## 2020-07-04 MED ORDER — CIPROFLOXACIN IN D5W 400 MG/200ML IV SOLN
INTRAVENOUS | Status: DC | PRN
  Administered 2020-07-04: 400 mg via INTRAVENOUS

## 2020-07-04 MED ORDER — ONDANSETRON HCL 4 MG/2ML IJ SOLN
INTRAMUSCULAR | Status: DC | PRN
  Administered 2020-07-04: 4 mg via INTRAVENOUS

## 2020-07-04 NOTE — Progress Notes (Signed)
Barry Contreras. 12:17 PM  Subjective: Other than weakness and some shortness of breath yesterday patient has no complaints and only has some minimal abdominal fullness and his office computer chart and his hospital computer chart was reviewed and his case discussed with our PA and Dr. 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

## 2020-07-04 NOTE — Discharge Instructions (Addendum)
Call if question or problem otherwise you may have clear liquids until 8 PM and if doing well may advance diet to soft solids otherwise may advance tomorrow and resume your blood thinner Eliquis on Thursday and we will call and set up a lab appointment early next week and an oncology appointment as soon as possible YOU HAD AN ENDOSCOPIC PROCEDURE TODAY: Refer to the procedure report and other information in the discharge instructions given to you for any specific questions about what was found during the examination. If this information does not answer your questions, please call [facility] office at [phone] to clarify.   YOU SHOULD EXPECT: Some feelings of bloating in the abdomen. Passage of more gas than usual. Walking can help get rid of the air that was put into your GI tract during the procedure and reduce the bloating. If you had a lower endoscopy (such as a colonoscopy or flexible sigmoidoscopy) you may notice spotting of blood in your stool or on the toilet paper. Some abdominal soreness may be present for a day or two, also.  DIET: Your first meal following the procedure should be a light meal and then it is ok to progress to your normal diet. A half-sandwich or bowl of soup is an example of a good first meal. Heavy or fried foods are harder to digest and may make you feel nauseous or bloated. Drink plenty of fluids but you should avoid alcoholic beverages for 24 hours. If you had a esophageal dilation, please see attached instructions for diet.   ACTIVITY: Your care partner should take you home directly after the procedure. You should plan to take it easy, moving slowly for the rest of the day. You can resume normal activity the day after the procedure however YOU SHOULD NOT DRIVE, use power tools, machinery or perform tasks that involve climbing or major physical exertion for 24 hours (because of the sedation medicines used during the test).   SYMPTOMS TO REPORT IMMEDIATELY: A  gastroenterologist can be reached at any hour. Please call [phone]  for any of the following symptoms:   Following upper endoscopy (EGD, EUS, ERCP, esophageal dilation) Vomiting of blood or coffee ground material  New, significant abdominal pain  New, significant chest pain or pain under the shoulder blades  Painful or persistently difficult swallowing  New shortness of breath  Black, tarry-looking or red, bloody stools  FOLLOW UP:  If any biopsies were taken you will be contacted by phone or by letter within the next 1-3 weeks. Call [phone]  if you have not heard about the biopsies in 3 weeks.  Please also call with any specific questions about appointments or follow up tests.

## 2020-07-04 NOTE — Transfer of Care (Signed)
Immediate Anesthesia Transfer of Care Note  Patient: Pratham Cassatt.  Procedure(s) Performed: ENDOSCOPIC RETROGRADE CHOLANGIOPANCREATOGRAPHY (ERCP) UPPER ENDOSCOPIC ULTRASOUND (EUS) RADIAL FINE NEEDLE ASPIRATION (FNA) LINEAR SPHINCTEROTOMY BILIARY STENT PLACEMENT  Patient Location: Endoscopy Unit  Anesthesia Type:MAC  Level of Consciousness: drowsy  Airway & Oxygen Therapy: Patient Spontanous Breathing  Post-op Assessment: Report given to RN and Post -op Vital signs reviewed and stable  Post vital signs: Reviewed and stable  Last Vitals:  Vitals Value Taken Time  BP 150/99 07/04/20 1430  Temp 37.1 C 07/04/20 1427  Pulse 99 07/04/20 1432  Resp 15 07/04/20 1432  SpO2 97 % 07/04/20 1432  Vitals shown include unvalidated device data.  Last Pain:  Vitals:   07/04/20 1427  TempSrc: Oral  PainSc: 0-No pain      Patients Stated Pain Goal: 2 (28/36/62 9476)  Complications: No notable events documented.

## 2020-07-04 NOTE — Op Note (Signed)
[facility] Patient Name: Barry Contreras Procedure Date: 07/04/2020 MRN: [id number] Attending MD: Clarene Essex , MD Date of Birth: 1931-01-29 CSN: [id number] Age: 87 Admit Type: Outpatient Procedure:                ERCP Indications:              Abnormal MRI and pancreatic CT, Elevated liver                            enzymes, Malignant tumor of the head of pancreas                            obstructive jaundice Providers:                Clarene Essex, MD, Kary Kos RN, RN, Hinton Dyer,                            Ladona Ridgel, Technician Referring MD:              Medicines:                General Anesthesia Complications:            No immediate complications. Estimated Blood Loss:     Estimated blood loss: none. Procedure:                Pre-Anesthesia Assessment:                           - Prior to the procedure, a History and Physical                            was performed, and patient medications and                            allergies were reviewed. The patient's tolerance of                            previous anesthesia was also reviewed. The risks                            and benefits of the procedure and the sedation                            options and risks were discussed with the patient.                            All questions were answered, and informed consent                            was obtained. Prior Anticoagulants: The patient has                            taken Eliquis (apixaban), last dose was 3 days  prior to procedure. ASA Grade Assessment: III - A                            patient with severe systemic disease. After                            reviewing the risks and benefits, the patient was                            deemed in satisfactory condition to undergo the                            procedure.                           After obtaining informed consent, the scope was                             passed under direct vision. Throughout the                            procedure, the patient's blood pressure, pulse, and                            oxygen saturations were monitored continuously. The                            ERCP [id number] was introduced through the mouth, and                            used to inject contrast into and used to locate the                            major papilla. The ERCP was accomplished without                            difficulty. The patient tolerated the procedure                            well. Scope In: Scope Out: Findings:      The major papilla was normal. Deep selective cannulation was fairly       readily obtained and possibly the wire went slightly towards the       pancreas 1 time and was repositioned and then the CBD was cannulated and       injection confirmed the short lower to mid duct stricture and we       proceeded with a biliary sphincterotomy was made with a Hydratome       sphincterotome using ERBE electrocautery. There was no       post-sphincterotomy bleeding. We did a medium size sphincterotomy and       then we placed one 10 Fr by 6 cm uncovered metal stent with no external       flaps and no internal flaps was placed 5.5 cm into the common bile duct.  Bile flowed through the stent. The stent was in good position. The wire       and introducer were removed and the scope was removed and the procedure       was terminated at this point Impression:               - The major papilla appeared normal.                           - A biliary sphincterotomy was performed.                           - One uncovered metal stent was placed into the                            common bile duct. Across the lower to mid duct                            short stricture Moderate Sedation:      Not Applicable - Patient had care per Anesthesia. Recommendation:           - Clear liquid diet for 6 hours.                           - Resume  Eliquis (apixaban) at prior dose in 2 days.                           - Return to GI clinic PRN. Otherwise will repeat                            labs early next week                           - Telephone GI clinic if symptomatic PRN.                           - Refer to an oncologist at appointment to be                            scheduled. Procedure Code(s):        --- Professional ---                           [phone], Esophagogastroduodenoscopy, flexible,                            transoral; diagnostic, including collection of                            specimen(s) by brushing or washing, when performed                            (separate procedure) Diagnosis Code(s):        --- Professional ---  R74.8, Abnormal levels of other serum enzymes                           C25.0, Malignant neoplasm of head of pancreas                           R93.2, Abnormal findings on diagnostic imaging of                            liver and biliary tract CPT copyright 2019 [employer]. All rights reserved. The codes documented in this report are preliminary and upon coder review may  be revised to meet current compliance requirements. Clarene Essex, MD 07/04/2020 2:23:14 PM This report has been signed electronically. Number of Addenda: 0

## 2020-07-04 NOTE — H&P (Signed)
[facility] H/P Note  Referring Provider: No ref. provider found Primary Care Physician:  Lajean Manes, MD Primary Gastroenterologist:  Dr. Watt Climes  Reason for Consultation:  Obstructive jaundice  HPI: Barry Contreras. is a 87 y.o. male with obstructive jaundice, weight loss, pancreatic mass on imaging.   Occasional right-sided abdominal pain.  No fevers.   Past Medical History:  Diagnosis Date   Diverticulitis    Hyperglycemia    Hypertension    Inguinal hernia    Macular degeneration    Mitral regurgitation    Umbilical hernia     Past Surgical History:  Procedure Laterality Date   CATARACT EXTRACTION     IRRIGATION AND DEBRIDEMENT SEBACEOUS CYST      Prior to Admission medications   Medication Sig Start Date End Date Taking? Authorizing Provider  acetaminophen (TYLENOL) 325 MG tablet Take 325-650 mg by mouth every 6 (six) hours as needed (Arthritis).   Yes [provider]  apixaban (ELIQUIS) 5 MG TABS tablet Take 5 mg by mouth 2 (two) times daily.   Yes [provider]  azelastine (ASTELIN) 0.1 % nasal spray Place 1 spray into both nostrils daily as needed (To keep ears open).   Yes [provider]  benazepril-hydrochlorthiazide (LOTENSIN HCT) 20-12.5 MG per tablet Take 1 tablet by mouth daily.     Yes [provider]  calcium elemental as carbonate (TUMS ULTRA 1000) 400 MG chewable tablet Chew 2,000 mg by mouth daily as needed for heartburn.   Yes [provider]  CARDIZEM LA 120 MG 24 hr tablet Take 120 mg by mouth daily.  06/17/19  Yes [provider]  dorzolamide-timolol (COSOPT) 22.3-6.8 MG/ML ophthalmic solution Place 1 drop into both eyes 2 (two) times daily.     Yes [provider]  ketotifen (ZADITOR) 0.025 % ophthalmic solution Place 1 drop into both eyes daily as needed (Clear and dry eyes).   Yes [provider]  Lysine 1000 MG TABS Take 1,000 mg by mouth daily.   Yes [provider]  metoprolol (LOPRESSOR) 50 MG tablet Take 50 mg by mouth 2 (two) times daily.     Yes [provider]  Multiple Vitamin (MULTIVITAMIN) tablet Take 1 tablet by mouth daily.     Yes [provider]  Multiple Vitamins-Minerals (VISION-VITE PRESERVE PO) Take 1 tablet by mouth 2 (two) times daily.   Yes [provider]  Saw Palmetto 450 MG CAPS Take 900 mg by mouth 2 (two) times daily.   Yes [provider]    Current Facility-Administered Medications  Medication Dose Route Frequency Provider Last Rate Last Admin   0.9 %  sodium chloride infusion   Intravenous Continuous Clarene Essex, MD       lactated ringers infusion   Intravenous Continuous Clarene Essex, MD 10 mL/hr at 07/04/20 1230 New Bag at 07/04/20 1230    Allergies as of 06/29/2020   (No Known Allergies)    Family History  Problem Relation Age of Onset   Hypertension Mother    Stroke Mother    Heart attack Mother    Hypertension Father    Alzheimer's disease Father    Pneumonia Father    Stroke Sister    Alzheimer's disease Sister    Congenital heart disease Brother     Social History   Socioeconomic History   Marital status: Married    Spouse name: Not on file   Number of children: Not on file  Years of education: Not on file   Highest education level: Not on file  Occupational History   Not on file  Tobacco Use   Smoking status: Former    Years: 10.00    Pack years: 0.00    Types: Cigarettes    Quit date: 01/08/1968    Years since quitting: 52.5   Smokeless tobacco: Never  Substance and Sexual Activity   Alcohol use: Yes    Comment: "very moderate"   Drug use: No   Sexual activity: Not on file  Other Topics Concern   Not on file  Social History Narrative   Not on file   Social Determinants of Health   Financial Resource Strain: Not on file  Food Insecurity: Not on file  Transportation Needs: Not on file  Physical Activity: Not on file  Stress: Not on  file  Social Connections: Not on file  Intimate Partner Violence: Not on file    Review of Systems:  As per HPI, all others negative  Physical Exam: Vital signs in last 24 hours: Temp:  [98 F (36.7 C)] 98 F (36.7 C) (06/28 1224) Pulse Rate:  [119] 119 (06/28 1224) Resp:  [16] 16 (06/28 1224) BP: (140)/(89) 140/89 (06/28 1224) SpO2:  [98 %] 98 % (06/28 1224) Weight:  [72.6 kg] 72.6 kg (06/28 1224)   General:  NAD, younger-appearing than stated age HEENT:  Sclera icteric SKIN:  Jaundiced, occasional ecchymoses ABD:  Soft, mild epigastric tenderness NEURO:  A/O; non focal without lateralizing signs  Lab Results: No results for input(s): WBC, HGB, HCT, PLT in the last 72 hours. BMET No results for input(s): NA, K, CL, CO2, GLUCOSE, BUN, CREATININE, CALCIUM in the last 72 hours. LFT No results for input(s): PROT, ALBUMIN, AST, ALT, ALKPHOS, BILITOT, BILIDIR, IBILI in the last 72 hours. PT/INR No results for input(s): LABPROT, INR in the last 72 hours.  Studies/Results: No results found.  Impression: Pancreatic mass Elevated LFTs Weight loss. Overall constellation of findings worrisome for pancreatic adenocarcinoma. Chronic anticoagulation, apixaban, last dose 6/25.  Plan:   Upper endoscopic ultrasound with possible fine needle aspiration. Risks (bleeding, infection, bowel perforation that could require surgery, sedation-related changes in cardiopulmonary systems), benefits (identification and possible treatment of source of symptoms, exclusion of certain causes of symptoms), and alternatives (watchful waiting, radiographic imaging studies, empiric medical treatment) of upper endoscopy with ultrasound and possible fine needle aspiration (EUS +/- FNA) were explained to patient/family in detail and patient wishes to proceed.  Endoscopic retrograde cholangiopancreatography with anticipated biliary stent placement. Risks (up to and including bleeding, infection, perforation,  pancreatitis that can be complicated by infected necrosis and death), benefits (removal of stones, alleviating blockage, decreasing risk of cholangitis or choledocholithiasis-related pancreatitis), and alternatives (watchful waiting, percutaneous transhepatic cholangiography) of ERCP were explained to patient/family in detail and patient elects to proceed.    LOS: 0 days   Jesi Jurgens M  07/04/2020, 12:48 PM  Cell [phone] If no answer or after 5 PM call [phone]

## 2020-07-04 NOTE — Anesthesia Procedure Notes (Signed)
Procedure Name: Intubation Date/Time: 07/04/2020 1:00 PM Performed by: Milford Cage, CRNA Pre-anesthesia Checklist: Patient identified, Emergency Drugs available, Suction available and Patient being monitored Patient Re-evaluated:Patient Re-evaluated prior to induction Oxygen Delivery Method: Circle System Utilized Preoxygenation: Pre-oxygenation with 100% oxygen Induction Type: IV induction Ventilation: Mask ventilation without difficulty Laryngoscope Size: Miller and 2 Grade View: Grade II Tube type: Oral Tube size: 7.5 mm Number of attempts: 1 Airway Equipment and Method: Stylet Placement Confirmation: ETT inserted through vocal cords under direct vision, positive ETCO2 and breath sounds checked- equal and bilateral Secured at: 22 cm Tube secured with: Tape Dental Injury: Teeth and Oropharynx as per pre-operative assessment

## 2020-07-04 NOTE — Anesthesia Preprocedure Evaluation (Addendum)
Anesthesia Evaluation  Patient identified by MRN, date of birth, ID band Patient awake    Reviewed: Allergy & Precautions, NPO status , Patient's Chart, lab work & pertinent test results  Airway Mallampati: II  TM Distance: >3 FB Neck ROM: Limited    Dental no notable dental hx.    Pulmonary neg pulmonary ROS,    Pulmonary exam normal breath sounds clear to auscultation       Cardiovascular hypertension, Pt. on medications and Pt. on home beta blockers + Peripheral Vascular Disease  + dysrhythmias Atrial Fibrillation + Valvular Problems/Murmurs MR  Rhythm:Irregular Rate:Tachycardia + Systolic murmurs    Neuro/Psych negative neurological ROS  negative psych ROS   GI/Hepatic negative GI ROS, Neg liver ROS,   Endo/Other  negative endocrine ROS  Renal/GU negative Renal ROS  negative genitourinary   Musculoskeletal negative musculoskeletal ROS (+)   Abdominal   Peds negative pediatric ROS (+)  Hematology negative hematology ROS (+)   Anesthesia Other Findings   Reproductive/Obstetrics negative OB ROS                           Anesthesia Physical Anesthesia Plan  ASA: 3  Anesthesia Plan: General   Post-op Pain Management:    Induction: Intravenous  PONV Risk Score and Plan: 2 and Ondansetron, Dexamethasone and Treatment may vary due to age or medical condition  Airway Management Planned: Oral ETT  Additional Equipment:   Intra-op Plan:   Post-operative Plan: Extubation in OR  Informed Consent: I have reviewed the patients History and Physical, chart, labs and discussed the procedure including the risks, benefits and alternatives for the proposed anesthesia with the patient or authorized representative who has indicated his/her understanding and acceptance.     Dental advisory given  Plan Discussed with: CRNA and Surgeon  Anesthesia Plan Comments:         Anesthesia  Quick Evaluation

## 2020-07-04 NOTE — Op Note (Signed)
[facility] Patient Name: Barry Contreras Procedure Date: 07/04/2020 MRN: [id number] Attending MD: Arta Silence , MD Date of Birth: [birth year] CSN: [id number] Age: 87 Admit Type: Outpatient Procedure:                Upper EUS Indications:              Common bile duct dilation (acquired) seen on MRI,                            Suspected mass in pancreas on MRI, Elevated liver                            enzymes Providers:                Kary Kos RN, RN, Carmie End, RN, Ladona Ridgel, Technician, Hinton Dyer, Arta Silence, MD Referring MD:             Dr. Lajean Manes Medicines:                General Anesthesia, Cipro 338 mg IV Complications:            No immediate complications. Estimated Blood Loss:     Estimated blood loss: none. Procedure:                Pre-Anesthesia Assessment:                           - Prior to the procedure, a History and Physical                            was performed, and patient medications and                            allergies were reviewed. The patient's tolerance of                            previous anesthesia was also reviewed. The risks                            and benefits of the procedure and the sedation                            options and risks were discussed with the patient.                            All questions were answered, and informed consent                            was obtained. Prior Anticoagulants: The patient has                            taken Eliquis (apixaban), last dose was 3 days  prior to procedure. ASA Grade Assessment: III - A                            patient with severe systemic disease. After                            reviewing the risks and benefits, the patient was                            deemed in satisfactory condition to undergo the                            procedure.                           After obtaining  informed consent, the endoscope was                            passed under direct vision. Throughout the                            procedure, the patient's blood pressure, pulse, and                            oxygen saturations were monitored continuously. The                            ([id number]) [id number] Linear EUS was introduced                            through the mouth, and advanced to the duodenal                            bulb. The upper EUS was accomplished without                            difficulty. The patient tolerated the procedure                            well. Scope In: Scope Out: Findings:      ENDOSONOGRAPHIC FINDING: :      There was no sign of significant endosonographic abnormality in the left       lobe of the liver. Homogeneous parenchyma was identified.      One malignant-appearing lymph node was visualized in the peripancreatic       region. It measured 10 mm by 10 mm in maximal cross-sectional diameter.       The node was hypoechoic.      There was dilation in the common bile duct which measured up to 12 mm.      An irregular mass was identified in the pancreatic head. The mass was       hypoechoic. The mass measured 35 mm by 25 mm in maximal cross-sectional       diameter. The endosonographic borders were poorly-defined. There was       sonographic evidence suggesting invasion into  the portal vein       (manifested by abutment) and the superior mesenteric vein (manifested by       abutment). An intact interface was seen between the mass and the       superior mesenteric artery and celiac trunk suggesting a lack of       invasion. The remainder of the pancreas was examined. The       endosonographic appearance of parenchyma and the upstream pancreatic       duct indicated duct dilation. Fine needle aspiration for cytology was       performed. Color Doppler imaging was utilized prior to needle puncture       to confirm a lack of significant vascular  structures within the needle       path. Three passes were made with the 25 gauge needle using a       transduodenal approach. A stylet was used. A cytologist was present and       performed a preliminary cytologic examination. The cellularity of the       specimen was adequate. Final cytology results are pending. Impression:               - There was no evidence of significant pathology in                            the left lobe of the liver.                           - One malignant-appearing lymph node was visualized                            in the peripancreatic region.                           - There was dilation in the common bile duct which                            measured up to 12 mm.                           - A mass was identified in the pancreatic head.                            This was staged T3 N1 Mx by endosonographic                            criteria. Fine needle aspiration performed. Moderate Sedation:      Not Applicable - Patient had care per Anesthesia. Recommendation:           - Perform an ERCP today.                           - Await cytology results. Procedure Code(s):        --- Professional ---                           [phone], Esophagogastroduodenoscopy, flexible,  transoral; with transendoscopic ultrasound-guided                            intramural or transmural fine needle                            aspiration/biopsy(s), (includes endoscopic                            ultrasound examination limited to the esophagus,                            stomach or duodenum, and adjacent structures) Diagnosis Code(s):        --- Professional ---                           I89.9, Noninfective disorder of lymphatic vessels                            and lymph nodes, unspecified                           K83.8, Other specified diseases of biliary tract                           K86.89, Other specified diseases of pancreas                            R74.8, Abnormal levels of other serum enzymes                           R93.3, Abnormal findings on diagnostic imaging of                            other parts of digestive tract CPT copyright 2019 [employer]. All rights reserved. The codes documented in this report are preliminary and upon coder review may  be revised to meet current compliance requirements. Arta Silence, MD 07/04/2020 1:59:08 PM This report has been signed electronically. Number of Addenda: 0

## 2020-07-04 NOTE — Anesthesia Postprocedure Evaluation (Signed)
Anesthesia Post Note  Patient: Barry Contreras.  Procedure(s) Performed: ENDOSCOPIC RETROGRADE CHOLANGIOPANCREATOGRAPHY (ERCP) UPPER ENDOSCOPIC ULTRASOUND (EUS) RADIAL FINE NEEDLE ASPIRATION (FNA) Liberal     Patient location during evaluation: PACU Anesthesia Type: General Level of consciousness: awake and alert Pain management: pain level controlled Vital Signs Assessment: post-procedure vital signs reviewed and stable Respiratory status: spontaneous breathing, nonlabored ventilation, respiratory function stable and patient connected to nasal cannula oxygen Cardiovascular status: blood pressure returned to baseline and stable Postop Assessment: no apparent nausea or vomiting Anesthetic complications: no   No notable events documented.  Last Vitals:  Vitals:   07/04/20 1500 07/04/20 1510  BP: (!) 161/107 (!) 154/91  Pulse: (!) 131 (!) 107  Resp: 10 11  Temp:    SpO2: 98% 99%    Last Pain:  Vitals:   07/04/20 1427  TempSrc: Oral  PainSc: 0-No pain                 Hareem Surowiec S

## 2020-07-05 DIAGNOSIS — R7303 Prediabetes: Secondary | ICD-10-CM | POA: Insufficient documentation

## 2020-07-05 DIAGNOSIS — I480 Paroxysmal atrial fibrillation: Secondary | ICD-10-CM | POA: Insufficient documentation

## 2020-07-05 DIAGNOSIS — I712 Thoracic aortic aneurysm, without rupture, unspecified: Secondary | ICD-10-CM | POA: Insufficient documentation

## 2020-07-05 DIAGNOSIS — I10 Essential (primary) hypertension: Secondary | ICD-10-CM | POA: Insufficient documentation

## 2020-07-05 DIAGNOSIS — D6859 Other primary thrombophilia: Secondary | ICD-10-CM | POA: Insufficient documentation

## 2020-07-05 DIAGNOSIS — I340 Nonrheumatic mitral (valve) insufficiency: Secondary | ICD-10-CM | POA: Insufficient documentation

## 2020-07-05 DIAGNOSIS — I4821 Permanent atrial fibrillation: Secondary | ICD-10-CM | POA: Insufficient documentation

## 2020-07-05 DIAGNOSIS — G4730 Sleep apnea, unspecified: Secondary | ICD-10-CM | POA: Insufficient documentation

## 2020-07-05 DIAGNOSIS — I700 Atherosclerosis of aorta: Secondary | ICD-10-CM | POA: Insufficient documentation

## 2020-07-05 DIAGNOSIS — H3530 Unspecified macular degeneration: Secondary | ICD-10-CM | POA: Insufficient documentation

## 2020-07-06 ENCOUNTER — Encounter (HOSPITAL_COMMUNITY): Payer: Self-pay | Admitting: Gastroenterology

## 2020-07-06 LAB — CYTOLOGY - NON PAP

## 2020-07-11 ENCOUNTER — Encounter (HOSPITAL_COMMUNITY): Payer: Self-pay | Admitting: Gastroenterology

## 2020-07-11 DIAGNOSIS — K8689 Other specified diseases of pancreas: Secondary | ICD-10-CM | POA: Diagnosis not present

## 2020-07-13 ENCOUNTER — Other Ambulatory Visit: Payer: Self-pay

## 2020-07-13 ENCOUNTER — Inpatient Hospital Stay: Payer: Medicare PPO | Attending: Oncology | Admitting: Oncology

## 2020-07-13 VITALS — BP 152/87 | HR 87 | Temp 98.0°F | Resp 18 | Ht 65.0 in | Wt 161.4 lb

## 2020-07-13 DIAGNOSIS — K831 Obstruction of bile duct: Secondary | ICD-10-CM | POA: Diagnosis not present

## 2020-07-13 DIAGNOSIS — Z7901 Long term (current) use of anticoagulants: Secondary | ICD-10-CM | POA: Diagnosis not present

## 2020-07-13 DIAGNOSIS — Z79899 Other long term (current) drug therapy: Secondary | ICD-10-CM | POA: Diagnosis not present

## 2020-07-13 DIAGNOSIS — C250 Malignant neoplasm of head of pancreas: Secondary | ICD-10-CM | POA: Diagnosis not present

## 2020-07-13 DIAGNOSIS — H409 Unspecified glaucoma: Secondary | ICD-10-CM

## 2020-07-13 DIAGNOSIS — I4891 Unspecified atrial fibrillation: Secondary | ICD-10-CM

## 2020-07-13 MED ORDER — TRAMADOL HCL 50 MG PO TABS: 25.0000 mg | ORAL_TABLET | Freq: Four times a day (QID) | ORAL | 0 refills | Status: DC | PRN

## 2020-07-13 NOTE — Progress Notes (Signed)
[facility] Patient Consult   Requesting MD: Lajean Manes, [place] [address] [place],  [place] [address]   [name]. 87 y.o.  [birth year]    Reason for Consult: Pancreas cancer   HPI: Mr. Enis saw Dr. Felipa Eth with right upper quadrant pain on 05/29/2020.  A chemistry panel revealed mild elevation of the calcium.  He was referred for a CT of the abdomen on 05/29/2020.  This revealed a stable subcentimeter right lower lobe nodule.  Stable benign vascular shunts were noted in the anterior left liver.  No hepatic masses.  An enlarged low-attenuation area was seen in the pancreas head, new compared to a scan from 2014.  No pathologically enlarged lymph nodes.  He was referred for an MRI of the abdomen on 06/17/2020.  A subtle hypoenhancing lesion was noted in segment 6 of the liver suspicious for metastasis.  Additional subcentimeter cystic lesions were noted.  The gallbladder is distended containing sludge and tiny gallstones.  No biliary ductal dilatation.  A hypoenhancing lesion was noted in the pancreas head.  The lesion abuts the anterior surface of the portal vein.  No pathologically enlarged lymph nodes.  He developed jaundice and was referred to Dr. Watt Climes for an ERCP on 07/04/2020.  A low to mid duct stricture was confirmed and an uncovered stent was placed in the common bile duct.  An endoscopic ultrasound by Dr. Paulita Fujita the same day revealed a malignant appearing lymph node measuring 10 mm in the peripancreatic region.  An irregular mass was noted in the pancreas head measuring 35 x 25 mm with poorly defined borders.  There was evidence of abutment of the portal vein and superior mesenteric vein.  No involvement of the superior mesenteric artery or celiac trunk.  A fine-needle aspiration biopsy of the pancreas head mass was performed. The cytology revealed malignant cells consistent with adenocarcinoma.  Mr. Penson reports a decrease in abdominal  discomfort following the ERCP procedure.  His urine is less dark.  Past Medical History:  Diagnosis Date   Diverticulitis    Hyperglycemia    Hypertension    Left inguinal hernia    Macular degeneration    Mitral regurgitation/aortic stenosis    Umbilical hernia     .  Thoracic aneurysm   .  Atrial fibrillation   .  Glaucoma  Past Surgical History:  Procedure Laterality Date   BILIARY STENT PLACEMENT N/A 07/04/2020   Procedure: BILIARY STENT PLACEMENT;  Surgeon: Clarene Essex, MD;  Location: [facility];  Service: Endoscopy;  Laterality: N/A;   CATARACT EXTRACTION     ERCP N/A 07/04/2020   Procedure: ENDOSCOPIC RETROGRADE CHOLANGIOPANCREATOGRAPHY (ERCP);  Surgeon: Clarene Essex, MD;  Location: [facility];  Service: Endoscopy;  Laterality: N/A;   EUS N/A 07/04/2020   Procedure: UPPER ENDOSCOPIC ULTRASOUND (EUS) RADIAL;  Surgeon: Clarene Essex, MD;  Location: [facility];  Service: Endoscopy;  Laterality: N/A;   FINE NEEDLE ASPIRATION  07/04/2020   Procedure: FINE NEEDLE ASPIRATION (FNA) LINEAR;  Surgeon: Clarene Essex, MD;  Location: [facility];  Service: Endoscopy;;   IRRIGATION AND DEBRIDEMENT SEBACEOUS CYST     SPHINCTEROTOMY  07/04/2020   Procedure: SPHINCTEROTOMY;  Surgeon: Clarene Essex, MD;  Location: [facility];  Service: Endoscopy;;    Medications: Reviewed  Allergies: No Known Allergies  Family history: His maternal grandfather had prostate cancer.  A paternal aunt had breast cancer.  A paternal niece died of breast cancer in her 84s.  A  paternal nephew died of colon cancer in his 69s.  Social History:   He lives with his wife in [place].  He is a retired college history professor.  He quit smoking cigarettes in his 78s.  Rare alcohol use.  No transfusion history.  No risk factor for HIV or hepatitis.  He has received 4 COVID-19 vaccines.  ROS:   Positives include:  A complete ROS was otherwise negative.  Physical Exam:  Blood pressure (!) 152/87, pulse 87,  temperature 98 F (36.7 C), temperature source Oral, resp. rate 18, height 5\' 5"  (1.651 m), weight 161 lb 6.4 oz (73.2 kg), SpO2 100 %.  HEENT: Scleral icterus Lungs: Clear bilaterally Cardiac: Irregular Abdomen: No mass, no hepatosplenomegaly, nontender, no apparent ascites, reducible left inguinal hernia Vascular: No leg edema Lymph nodes: No cervical, supraclavicular, axillary, or inguinal nodes Neurologic: Alert and oriented, the motor exam appears intact in the upper and lower extremities bilaterally Skin: Jaundice Musculoskeletal: No spine tenderness   LAB:  CBC  No results found for: WBC, HGB, HCT, MCV, PLT, NEUTROABS      CMP 06/28/2020-[facility]: Alkaline phosphatase 597, AST 138, ALT 288, bilirubin 18.7  CA 19-9 on 06/21/2020: 1107   Imaging:  As per HPI, CT and MRI images reviewed with Mr. Seyfried and his wife    Assessment/Plan:   Pancreas cancer, clinical stage IV (cT2,cN1,cM1) CT abdomen/pelvis 05/29/2020 pancreas head mass versus focal pancreatitis MRI abdomen 06/17/2020-hypoenhancing mass in the pancreas head, 3.7 x 3 cm, lesion abuts the anterior surface of the portal vein, subtle 1 cm hypoenhancing segment 6 lesion suspicious for metastasis, no lymphadenopathy, distended gallbladder containing sludge and probable tiny gallstones EUS 07/04/2020, 10 mm malignant appearing peripancreatic lymph node, 35 x 25 mm pancreas head mass, abutment of the portal vein and superior mesenteric vein, no invasion of the SMA or celiac trunk, upstream pancreatic duct dilation, FNA biopsy of the pancreas head mass-adenocarcinoma Obstructive jaundice secondary to #1-ERCP 07/04/2020-low to mid duct stricture, uncovered metal stent placed in the common bile duct Atrial fibrillation-maintained on apixaban anticoagulation Glaucoma Aortic aneurysm   Disposition:   Mr. Stalvey has been diagnosed with adenocarcinoma the head of the pancreas.  There is radiologic evidence of  metastatic disease involving a single liver lesion.  I discussed the prognosis and treatment options with Mr. Sparks and his wife.  His daughter was present for today's visit by telephone.  I explained the only potentially curative therapy for pancreas cancer is surgical resection.  He appears to have a borderline resectable tumor.  If the liver lesion is metastasis he will not be a surgical candidate.  He has a good performance status and appears to be a candidate for systemic therapy.  I reviewed the MRI images with interventional radiology.  He will be referred for an ultrasound-guided biopsy of the liver lesion.  If the lesion cannot be approached by ultrasound we will schedule a staging PET scan.  I will present his case at the GI tumor conference and he will return for further discussion in approximately 2 weeks.  I will recommend gemcitabine/Abraxane if he is proven to have metastatic disease or as neoadjuvant therapy.  Mr Wintle will be referred to the genetics counselors.  He was given a prescription for tramadol to use as needed for pain.  He continues to have jaundice following placement of the bile duct stent.  Hopefully the jaundice will resolve over the next few weeks.  We will obtain a baseline chemistry panel and  CA 19-9 prior to initiating systemic therapy.  Betsy Coder, MD  07/13/2020, 2:02 PM

## 2020-07-14 NOTE — Progress Notes (Signed)
Oncology Nurse Navigator Flowsheets 07/13/2020  Abnormal Finding Date 05/29/2020  Confirmed Diagnosis Date 07/04/2020  Diagnosis Status Additional Work Up  Landscape architect (No Data)  Referral Date to RadOnc/MedOnc 07/06/2020  Patient Visit Type Initial  Barriers/Navigation Needs Coordination of Care;Transportation;Morbidities/Frailty  Interventions Transportation;Education;Coordination of Care  Coordination of Care Pathology;Radiology  Education Method Verbal;Written  Time Spent with Patient 30  Genetic Counseling Type Non-Urgent

## 2020-07-17 ENCOUNTER — Encounter (HOSPITAL_COMMUNITY): Payer: Self-pay

## 2020-07-17 ENCOUNTER — Telehealth: Payer: Self-pay | Admitting: *Deleted

## 2020-07-17 NOTE — Progress Notes (Unsigned)
Patient Demographics  Patient Name  Barry Contreras, Barry Contreras Legal Sex  Male DOB  [birth year] SSN  [id number] Address (Temporary)  [address]  [place] [address] Phone (Permanent)  [phone] (Home)  [phone] (Mobile) *Preferred*     RE: Biopsy Received: 3 days ago Arne Cleveland, MD  [name]   [place] FNA liver lesion segment 5 r/o panc CA met  See MRI [id number]   Lesion may not be visible on [place].   DDH         Previous Messages    ----- Message -----  From: Lenore Cordia  Sent: 07/14/2020   4:23 PM EDT  To: [facility] Procedure Requests  Subject: Biopsy                                         Procedure Requested: US Biopsy    Reason for Procedure: pancreas cancer    Provider Requesting:  Dr Benay Spice  Provider Telephone:  [phone]    Other Info:

## 2020-07-18 ENCOUNTER — Telehealth: Payer: Self-pay | Admitting: *Deleted

## 2020-07-18 ENCOUNTER — Encounter (HOSPITAL_COMMUNITY): Payer: Self-pay

## 2020-07-18 ENCOUNTER — Telehealth: Payer: Self-pay

## 2020-07-18 NOTE — Progress Notes (Unsigned)
RE: Biopsy Received: Today [name], MD  [name] to hold eliquis for biopsy         Previous Messages    ----- Message -----  From: Lenore Cordia  Sent: 07/17/2020   3:10 PM EDT  To: Lajean Manes, MD, [name], MD  Subject: Barry Contreras: Biopsy                                     Good Afternoon,   I have scheduled patient Barry Contreras for a Biopsy on July 14th,  Patient  Barry Contreras is on Eliquis, he will need hold his Blood thinner  Two days prior to Biopsy making today his last dose  Permission is needed   Barry Contreras  ----- Message -----  From: Arne Cleveland, MD  Sent: 07/14/2020   4:57 PM EDT  To: Lenore Cordia  Subject: RE: Biopsy                                     Ok   [place] FNA liver lesion segment 5 r/o panc CA met  See MRI [id number]   Lesion may not be visible on [place].   DDH    ----- Message -----  From: Lenore Cordia  Sent: 07/14/2020   4:23 PM EDT  To: [facility] Procedure Requests  Subject: Biopsy                                         Procedure Requested: US Biopsy    Reason for Procedure: pancreas cancer    Provider Requesting:  Dr Benay Spice  Provider Telephone:  [phone]    Other Info:

## 2020-07-18 NOTE — Telephone Encounter (Signed)
TC from [facility] with scheduling pertaining to Pt stopping Eliquis for biopsy Pt was aware of change and did not take medication this morning. Discussed with Dr. Benay Spice who informed me that is ok, for Pt to  hold Eliquis for biopsy appointment. V/M message left for Tasha with scheduling about Pt being aware of hold on medication.

## 2020-07-19 ENCOUNTER — Other Ambulatory Visit: Payer: Self-pay | Admitting: Radiology

## 2020-07-19 ENCOUNTER — Other Ambulatory Visit: Payer: Self-pay

## 2020-07-19 NOTE — Progress Notes (Signed)
The proposed treatment discussed in conference is for discussion purpose only and is not a binding recommendation.  The patients have not been physically examined, or presented with their treatment options.  Therefore, final treatment plans cannot be decided.  

## 2020-07-20 ENCOUNTER — Other Ambulatory Visit: Payer: Self-pay | Admitting: Oncology

## 2020-07-20 ENCOUNTER — Encounter (HOSPITAL_COMMUNITY): Payer: Self-pay

## 2020-07-20 ENCOUNTER — Other Ambulatory Visit: Payer: Self-pay

## 2020-07-20 ENCOUNTER — Ambulatory Visit (HOSPITAL_COMMUNITY)
Admission: RE | Admit: 2020-07-20 | Discharge: 2020-07-20 | Disposition: A | Discharge status: Home / Self Care | Payer: Medicare PPO | Source: Ambulatory Visit | Attending: Oncology | Admitting: Oncology

## 2020-07-20 DIAGNOSIS — C250 Malignant neoplasm of head of pancreas: Secondary | ICD-10-CM

## 2020-07-20 DIAGNOSIS — Z7901 Long term (current) use of anticoagulants: Secondary | ICD-10-CM | POA: Insufficient documentation

## 2020-07-20 DIAGNOSIS — Z0389 Encounter for observation for other suspected diseases and conditions ruled out: Secondary | ICD-10-CM | POA: Diagnosis not present

## 2020-07-20 LAB — PROTIME-INR
INR: 1.1 (ref 0.8–1.2)
Prothrombin Time: 14.1 seconds (ref 11.4–15.2)

## 2020-07-20 LAB — CBC
HCT: 37.6 % — ABNORMAL LOW (ref 39.0–52.0)
Hemoglobin: 12.5 g/dL — ABNORMAL LOW (ref 13.0–17.0)
MCH: 30.9 pg (ref 26.0–34.0)
MCHC: 33.2 g/dL (ref 30.0–36.0)
MCV: 92.8 fL (ref 80.0–100.0)
Platelets: 324 10*3/uL (ref 150–400)
RBC: 4.05 MIL/uL — ABNORMAL LOW (ref 4.22–5.81)
RDW: 14.7 % (ref 11.5–15.5)
WBC: 10.6 10*3/uL — ABNORMAL HIGH (ref 4.0–10.5)
nRBC: 0 % (ref 0.0–0.2)

## 2020-07-20 LAB — APTT: aPTT: 35 seconds (ref 24–36)

## 2020-07-20 MED ORDER — MIDAZOLAM HCL 2 MG/2ML IJ SOLN
INTRAMUSCULAR | Status: AC
  Filled 2020-07-20: qty 2

## 2020-07-20 MED ORDER — GELATIN ABSORBABLE 12-7 MM EX MISC
CUTANEOUS | Status: AC
  Filled 2020-07-20: qty 1

## 2020-07-20 MED ORDER — LIDOCAINE HCL 1 % IJ SOLN
INTRAMUSCULAR | Status: AC
  Filled 2020-07-20: qty 20

## 2020-07-20 MED ORDER — FENTANYL CITRATE (PF) 100 MCG/2ML IJ SOLN
INTRAMUSCULAR | Status: AC
  Filled 2020-07-20: qty 2

## 2020-07-20 MED ORDER — SODIUM CHLORIDE 0.9 % IV SOLN: INTRAVENOUS | Status: DC

## 2020-07-20 NOTE — H&P (Signed)
Chief Complaint: Patient was seen in consultation today for liver lesion biopsy at the request of Sherrill,Gary B  Referring Physician(s): [name]  Supervising Physician: Jacqulynn Cadet  Patient Status: [facility] - Out[facility]  History of Present Illness: Barry Contreras. is a 87 y.o. male with newly found pancreatic and hepatic lesions. He is referred for biopsy of the liver lesion. PMHx, meds, labs, imaging, allergies reviewed.Has held Eliquis past 48 hours as instructed Feels well, no recent fevers, chills, illness. Has been NPO today as directed.    Past Medical History:  Diagnosis Date   Diverticulitis    Hyperglycemia    Hypertension    Inguinal hernia    Macular degeneration    Mitral regurgitation    Umbilical hernia     Past Surgical History:  Procedure Laterality Date   BILIARY STENT PLACEMENT N/A 07/04/2020   Procedure: BILIARY STENT PLACEMENT;  Surgeon: Clarene Essex, MD;  Location: [facility];  Service: Endoscopy;  Laterality: N/A;   CATARACT EXTRACTION     ERCP N/A 07/04/2020   Procedure: ENDOSCOPIC RETROGRADE CHOLANGIOPANCREATOGRAPHY (ERCP);  Surgeon: Clarene Essex, MD;  Location: [facility];  Service: Endoscopy;  Laterality: N/A;   EUS N/A 07/04/2020   Procedure: UPPER ENDOSCOPIC ULTRASOUND (EUS) RADIAL;  Surgeon: Clarene Essex, MD;  Location: [facility];  Service: Endoscopy;  Laterality: N/A;   FINE NEEDLE ASPIRATION  07/04/2020   Procedure: FINE NEEDLE ASPIRATION (FNA) LINEAR;  Surgeon: Clarene Essex, MD;  Location: [facility];  Service: Endoscopy;;   IRRIGATION AND DEBRIDEMENT SEBACEOUS CYST     SPHINCTEROTOMY  07/04/2020   Procedure: SPHINCTEROTOMY;  Surgeon: Clarene Essex, MD;  Location: [facility];  Service: Endoscopy;;    Allergies: Patient has no known allergies.  Medications: Prior to Admission medications   Medication Sig Start Date End Date Taking? Authorizing Provider  acetaminophen (TYLENOL) 325 MG tablet Take 325-650 mg by mouth  every 6 (six) hours as needed (Arthritis).   Yes [provider]  Apoaequorin (PREVAGEN PO) Take by mouth daily.   Yes [provider]  azelastine (ASTELIN) 0.1 % nasal spray Place 1 spray into both nostrils daily as needed (To keep ears open).   Yes [provider]  benazepril-hydrochlorthiazide (LOTENSIN HCT) 20-12.5 MG per tablet Take 1 tablet by mouth daily.     Yes [provider]  calcium elemental as carbonate (TUMS ULTRA 1000) 400 MG chewable tablet Chew 2,000 mg by mouth daily as needed for heartburn.   Yes [provider]  CARDIZEM LA 120 MG 24 hr tablet Take 120 mg by mouth daily.  06/17/19  Yes [provider]  dorzolamide-timolol (COSOPT) 22.3-6.8 MG/ML ophthalmic solution Place 1 drop into both eyes 2 (two) times daily.     Yes [provider]  ketotifen (ZADITOR) 0.025 % ophthalmic solution Place 1 drop into both eyes daily as needed (Clear and dry eyes).   Yes [provider]  Lysine 1000 MG TABS Take 1,000 mg by mouth daily.   Yes [provider]  metoprolol (LOPRESSOR) 50 MG tablet Take 50 mg by mouth 2 (two) times daily.     Yes [provider]  Multiple Vitamin (MULTIVITAMIN) tablet Take 1 tablet by mouth daily.     Yes [provider]  Multiple Vitamins-Minerals (VISION-VITE PRESERVE PO) Take 1 tablet by mouth 2 (two) times daily.   Yes [provider]  Saw Palmetto 450 MG CAPS Take 900 mg by mouth 2 (two) times daily.   Yes [provider]  traMADol (ULTRAM) 50 MG tablet Take 0.5-1 tablets (25-50 mg total) by mouth every 6 (six) hours as needed. 07/13/20  Yes [name], MD  apixaban (ELIQUIS) 5 MG TABS tablet Take 5 mg by mouth 2 (two) times daily.    [provider]     Family History  Problem Relation Age of Onset   Hypertension Mother    Stroke Mother    Heart attack Mother    Hypertension Father    Alzheimer's disease Father    Pneumonia  Father    Stroke Sister    Alzheimer's disease Sister    Congenital heart disease Brother     Social History   Socioeconomic History   Marital status: Married    Spouse name: Not on file   Number of children: Not on file   Years of education: Not on file   Highest education level: Not on file  Occupational History   Not on file  Tobacco Use   Smoking status: Former    Years: 10.00    Types: Cigarettes    Quit date: 01/08/1968    Years since quitting: 52.5   Smokeless tobacco: Never  Substance and Sexual Activity   Alcohol use: Yes    Comment: "very moderate"   Drug use: No   Sexual activity: Not on file  Other Topics Concern   Not on file  Social History Narrative   Not on file   Social Determinants of Health   Financial Resource Strain: Not on file  Food Insecurity: Not on file  Transportation Needs: Not on file  Physical Activity: Not on file  Stress: Not on file  Social Connections: Not on file     Review of Systems: A 12 point ROS discussed and pertinent positives are indicated in the HPI above.  All other systems are negative.  Review of Systems  Vital Signs: BP (!) 158/98   Pulse 84   Temp 97.7 F (36.5 C) (Oral)   Resp 18   SpO2 100%   Physical Exam Constitutional:      Appearance: Normal appearance. He is not ill-appearing.  HENT:     Mouth/Throat:     Mouth: Mucous membranes are moist.     Pharynx: Oropharynx is clear.  Cardiovascular:     Rate and Rhythm: Normal rate and regular rhythm.     Heart sounds: Normal heart sounds.  Pulmonary:     Effort: Pulmonary effort is normal. No respiratory distress.     Breath sounds: Normal breath sounds.  Abdominal:     General: Abdomen is flat. There is no distension.     Palpations: Abdomen is soft.     Tenderness: There is no abdominal tenderness.  Skin:    General: Skin is warm and dry.  Neurological:     General: No focal deficit present.     Mental Status: He is alert and oriented to  person, place, and time.  Psychiatric:        Mood and Affect: Mood normal.        Thought Content: Thought content normal.        Judgment: Judgment normal.    Imaging: DG ERCP BILIARY & PANCREATIC DUCTS  Result Date: 07/04/2020 CLINICAL DATA:  87 year old male with a history biliary obstruction EXAM: ERCP TECHNIQUE: Multiple spot images obtained with the fluoroscopic device and submitted for interpretation post-procedure. FLUOROSCOPY TIME:  Fluoroscopy Time:  1 minutes 22 seconds COMPARISON:  None. FINDINGS: Limited intraoperative  fluoroscopic spot images during ERCP. Initial image demonstrates endoscope projecting over the upper abdomen with a safety wire in position within the extrahepatic biliary ducts and partial opacification. Subsequently there is placement of a metallic biliary stent. IMPRESSION: Images demonstrate placement of metallic biliary stent within the common bile duct. Please refer to the dictated operative report for full details of intraoperative findings and procedure. Electronically Signed   By: Corrie Mckusick D.O.   On: 07/04/2020 14:40    Labs:  CBC: Recent Labs    07/20/20 1150  WBC 10.6*  HGB 12.5*  HCT 37.6*  PLT 324    COAGS: No results for input(s): INR, APTT in the last 8760 hours.  BMP: No results for input(s): NA, K, CL, CO2, GLUCOSE, BUN, CALCIUM, CREATININE, GFRNONAA, GFRAA in the last 8760 hours.  Invalid input(s): CMP  LIVER FUNCTION TESTS: No results for input(s): BILITOT, AST, ALT, ALKPHOS, PROT, ALBUMIN in the last 8760 hours.  TUMOR MARKERS: No results for input(s): AFPTM, CEA, CA199, CHROMGRNA in the last 8760 hours.  Assessment and Plan: Liver lesion in setting of pancreatic mass Plan for image guided liver lesion biopsy Labs reviewed. Risks and benefits of liver bx was discussed with the patient and/or patient's family including, but not limited to bleeding, infection, damage to adjacent structures or low yield requiring additional  tests.  All of the questions were answered and there is agreement to proceed.  Consent signed and in chart.   Thank you for this interesting consult.  I greatly enjoyed meeting Barry Contreras. and look forward to participating in their care.  A copy of this report was sent to the requesting provider on this date.  Electronically Signed: Ascencion Dike, PA-C 07/20/2020, 12:20 PM   I spent a total of 20 minutes in face to face in clinical consultation, greater than 50% of which was counseling/coordinating care for liver lesion bx

## 2020-07-21 ENCOUNTER — Telehealth: Payer: Self-pay

## 2020-07-21 NOTE — Telephone Encounter (Signed)
just called to confirm that since liver ultrasound bx was not done he will need a PET scan.  did instruct that this was correct.  also gave her the RIDE number.

## 2020-07-24 ENCOUNTER — Other Ambulatory Visit: Payer: Self-pay

## 2020-07-24 ENCOUNTER — Inpatient Hospital Stay: Payer: Medicare PPO | Admitting: Oncology

## 2020-07-24 VITALS — BP 130/78 | HR 80 | Temp 97.5°F | Resp 18 | Wt 162.2 lb

## 2020-07-24 DIAGNOSIS — Z7901 Long term (current) use of anticoagulants: Secondary | ICD-10-CM | POA: Diagnosis not present

## 2020-07-24 DIAGNOSIS — C250 Malignant neoplasm of head of pancreas: Secondary | ICD-10-CM

## 2020-07-24 DIAGNOSIS — Z79899 Other long term (current) drug therapy: Secondary | ICD-10-CM | POA: Diagnosis not present

## 2020-07-24 DIAGNOSIS — H409 Unspecified glaucoma: Secondary | ICD-10-CM | POA: Diagnosis not present

## 2020-07-24 DIAGNOSIS — Z7189 Other specified counseling: Secondary | ICD-10-CM | POA: Diagnosis not present

## 2020-07-24 DIAGNOSIS — K831 Obstruction of bile duct: Secondary | ICD-10-CM | POA: Diagnosis not present

## 2020-07-24 DIAGNOSIS — I4891 Unspecified atrial fibrillation: Secondary | ICD-10-CM | POA: Diagnosis not present

## 2020-07-24 MED ORDER — TRAMADOL HCL 50 MG PO TABS: 25.0000 mg | ORAL_TABLET | Freq: Four times a day (QID) | ORAL | 0 refills | Status: DC | PRN

## 2020-07-24 NOTE — Progress Notes (Signed)
  Barry Contreras OFFICE PROGRESS NOTE   Diagnosis: Pancreas cancer  INTERVAL HISTORY:   Barry Contreras returns as scheduled.  He reports the abdominal pain is partially relieved with tramadol.  The urine is now clear.  He was referred for an ultrasound-guided biopsy of the segment 6 liver lesion on 07/20/2020.  The lesion could not be identified by ultrasound and the biopsy procedure was aborted.  Objective:  Vital signs in last 24 hours:  Blood pressure 130/78, pulse 80, temperature (!) 97.5 F (36.4 C), temperature source Tympanic, resp. rate 18, weight 162 lb 3.2 oz (73.6 kg), SpO2 100 %.    HEENT: Mild scleral icterus Resp: Lungs clear bilaterally Cardio: Irregular GI: Nontender, no hepatosplenomegaly, no mass Vascular: No leg edema   Lab Results:  Lab Results  Component Value Date   WBC 10.6 (H) 07/20/2020   HGB 12.5 (L) 07/20/2020   HCT 37.6 (L) 07/20/2020   MCV 92.8 07/20/2020   PLT 324 07/20/2020    Medications: I have reviewed the patient's current medications.   Assessment/Plan: Pancreas cancer, clinical stage IV (cT2,cN1,cM1) CT abdomen/pelvis 05/29/2020 pancreas head mass versus focal pancreatitis MRI abdomen 06/17/2020-hypoenhancing mass in the pancreas head, 3.7 x 3 cm, lesion abuts the anterior surface of the portal vein, subtle 1 cm hypoenhancing segment 6 lesion suspicious for metastasis, no lymphadenopathy, distended gallbladder containing sludge and probable tiny gallstones EUS 07/04/2020, 10 mm malignant appearing peripancreatic lymph node, 35 x 25 mm pancreas head mass, abutment of the portal vein and superior mesenteric vein, no invasion of the SMA or celiac trunk, upstream pancreatic duct dilation, FNA biopsy of the pancreas head mass-adenocarcinoma Ultrasound liver 07/20/2020-liver lesion could not be seen, biopsy procedure aborted Obstructive jaundice secondary to #1-ERCP 07/04/2020-low to mid duct stricture, uncovered metal stent placed in the  common bile duct Atrial fibrillation-maintained on apixaban anticoagulation Glaucoma Aortic aneurysm     Disposition: Barry Contreras has been diagnosed with pancreas cancer.  He appears to have locally advanced, borderline resectable, versus metastatic disease.  I presented his case at the GI tumor conference last week.  He does not appear to be a surgical candidate.  We discussed treatment options.  I recommend comfort care versus a trial of systemic therapy.  We discussed gemcitabine/Abraxane and FOLFIRINOX.  I do not recommend FOLFIRINOX in his case.  We discussed details of the gemcitabine/Abraxane regimen with chemotherapy given on an every 2-week schedule.  We also discussed comfort care.  He understands no therapy will be curative.  Barry. Contreras indicated he wishes to proceed with a trial of systemic therapy.  We reviewed potential toxicities associated with the gemcitabine/Abraxane regimen including the chance of nausea, alopecia, hematologic toxicity, infection, and bleeding.  We discussed the rash, fever, and pneumonitis associated with gemcitabine.  We discussed the allergic reaction and neuropathy seen with Abraxane.  He agrees to proceed.  We will obtain a chemotherapy teach class.  Barry. Contreras will be referred for Port-A-Cath placement with the plan to begin gemcitabine/Abraxane on 08/02/2020.  He will return for chemotherapy teaching class and lab on 07/26/2020.  A chemotherapy plan was entered today.  Betsy Coder, MD  07/24/2020  3:44 PM

## 2020-07-24 NOTE — Progress Notes (Signed)
START ON PATHWAY REGIMEN - Pancreatic Adenocarcinoma     A cycle is every 28 days:     Nab-paclitaxel (protein bound)      Gemcitabine   **Always confirm dose/schedule in your pharmacy ordering system**  Patient Characteristics: Locally Advanced, Anatomically Unresectable, First Line, PS = 0,1, BRCA1/2 and PALB2 Mutation Absent/Unknown, Chemotherapy Therapeutic Status: Locally Advanced, Anatomically Unresectable Line of Therapy: First Line ECOG Performance Status: 1 BRCA1/2 Mutation Status: Awaiting Test Results PALB2 Mutation Status: Awaiting Test Results Intent of Therapy: Non-Curative / Palliative Intent, Discussed with Patient

## 2020-07-26 ENCOUNTER — Inpatient Hospital Stay: Payer: Medicare PPO

## 2020-07-26 ENCOUNTER — Other Ambulatory Visit: Payer: Self-pay

## 2020-07-26 DIAGNOSIS — C250 Malignant neoplasm of head of pancreas: Secondary | ICD-10-CM | POA: Diagnosis not present

## 2020-07-26 DIAGNOSIS — H409 Unspecified glaucoma: Secondary | ICD-10-CM | POA: Diagnosis not present

## 2020-07-26 DIAGNOSIS — K831 Obstruction of bile duct: Secondary | ICD-10-CM | POA: Diagnosis not present

## 2020-07-26 DIAGNOSIS — Z79899 Other long term (current) drug therapy: Secondary | ICD-10-CM | POA: Diagnosis not present

## 2020-07-26 DIAGNOSIS — Z7901 Long term (current) use of anticoagulants: Secondary | ICD-10-CM | POA: Diagnosis not present

## 2020-07-26 DIAGNOSIS — I4891 Unspecified atrial fibrillation: Secondary | ICD-10-CM | POA: Diagnosis not present

## 2020-07-26 LAB — CMP (CANCER CENTER ONLY)
ALT: 36 U/L (ref 0–44)
AST: 25 U/L (ref 15–41)
Albumin: 3.9 g/dL (ref 3.5–5.0)
Alkaline Phosphatase: 130 U/L — ABNORMAL HIGH (ref 38–126)
Anion gap: 10 (ref 5–15)
BUN: 15 mg/dL (ref 8–23)
CO2: 26 mmol/L (ref 22–32)
Calcium: 9.4 mg/dL (ref 8.9–10.3)
Chloride: 101 mmol/L (ref 98–111)
Creatinine: 0.71 mg/dL (ref 0.61–1.24)
GFR, Estimated: >60 mL/min (ref >60)
Glucose, Bld: 124 mg/dL — ABNORMAL HIGH (ref 70–99)
Potassium: 4.1 mmol/L (ref 3.5–5.1)
Sodium: 137 mmol/L (ref 135–145)
Total Bilirubin: 2.2 mg/dL — ABNORMAL HIGH (ref 0.3–1.2)
Total Protein: 7 g/dL (ref 6.5–8.1)

## 2020-07-26 MED ORDER — PROCHLORPERAZINE MALEATE 10 MG PO TABS: 10.0000 mg | ORAL_TABLET | Freq: Four times a day (QID) | ORAL | 1 refills | Status: DC | PRN

## 2020-07-26 MED ORDER — LIDOCAINE-PRILOCAINE 2.5-2.5 % EX CREA: 1.0000 "application " | TOPICAL_CREAM | CUTANEOUS | 2 refills | Status: DC

## 2020-07-26 MED ORDER — ONDANSETRON HCL 8 MG PO TABS: 8.0000 mg | ORAL_TABLET | Freq: Three times a day (TID) | ORAL | 1 refills | Status: DC | PRN

## 2020-07-26 NOTE — Progress Notes (Signed)
Wife had questions during chemotherapy class today: Family [place] coming to town 7/29 for 5 days and asking if would be OK to delay chemo start to 8/4 or 8/5? Since not able to biopsy lesion on liver, would PET scan be of benefit to complete staging-she is asking about stage III vs Stage IV. If not, why?

## 2020-07-27 ENCOUNTER — Telehealth: Payer: Self-pay | Admitting: *Deleted

## 2020-07-27 ENCOUNTER — Other Ambulatory Visit: Payer: Self-pay

## 2020-07-27 ENCOUNTER — Inpatient Hospital Stay (HOSPITAL_BASED_OUTPATIENT_CLINIC_OR_DEPARTMENT_OTHER): Payer: Medicare PPO | Admitting: Genetic Counselor

## 2020-07-27 ENCOUNTER — Other Ambulatory Visit: Payer: Self-pay | Admitting: Genetic Counselor

## 2020-07-27 ENCOUNTER — Encounter: Payer: Self-pay | Admitting: Genetic Counselor

## 2020-07-27 ENCOUNTER — Inpatient Hospital Stay: Payer: Medicare PPO

## 2020-07-27 ENCOUNTER — Telehealth: Payer: Self-pay | Admitting: Oncology

## 2020-07-27 DIAGNOSIS — C250 Malignant neoplasm of head of pancreas: Secondary | ICD-10-CM

## 2020-07-27 DIAGNOSIS — Z800 Family history of malignant neoplasm of digestive organs: Secondary | ICD-10-CM | POA: Diagnosis not present

## 2020-07-27 DIAGNOSIS — Z803 Family history of malignant neoplasm of breast: Secondary | ICD-10-CM | POA: Diagnosis not present

## 2020-07-27 HISTORY — DX: Family history of malignant neoplasm of digestive organs: Z80.0

## 2020-07-27 HISTORY — DX: Family history of malignant neoplasm of breast: Z80.3

## 2020-07-27 LAB — GENETIC SCREENING ORDER

## 2020-07-27 NOTE — Progress Notes (Signed)
REFERRING PROVIDER: [name], MD [address] [place],  [place] [address]  PRIMARY PROVIDER:  Lajean Manes, MD  PRIMARY REASON FOR VISIT:  1. Cancer of head of pancreas ([place])   2. Family history of breast cancer   3. Family history of colon cancer     HISTORY OF PRESENT ILLNESS:   Barry Contreras, a 87 y.o. male, was seen for a [facility] cancer genetics consultation at the request of Dr. Benay Spice due to a personal history of pancreatic cancer.  Barry Contreras presents to clinic today to discuss the possibility of a hereditary predisposition to cancer, to discuss genetic testing, and to further clarify his future cancer risks, as well as potential cancer risks for family members.   In 2022, at the age of 69, Barry Contreras was diagnosed with cancer of the head of the pancreas. The treatment plan includes chemotherapy.  Barry Contreras also has a history of skin cancer diagnosed last year on his face.     CANCER HISTORY:  Oncology History  Cancer of head of pancreas ([place])  07/13/2020 Initial Diagnosis   Cancer of head of pancreas ([place])    07/13/2020 Cancer Staging   Staging form: Exocrine Pancreas, AJCC 8th Edition - Clinical: Stage IV (cT2, cN1, cM1) - Signed by [name], MD on 07/13/2020    08/02/2020 -  Chemotherapy    Patient is on Treatment Plan: PANCREATIC ABRAXANE / GEMCITABINE D1,8,15 Q28D          RISK FACTORS:  Colonoscopy: yes;  approx 2-3 years ago . No reported DM No reported pancreatitis Any excessive radiation exposure in the past: no  Past Medical History:  Diagnosis Date   Diverticulitis    Family history of breast cancer 07/27/2020   Family history of colon cancer 07/27/2020   Hyperglycemia    Hypertension    Inguinal hernia    Macular degeneration    Mitral regurgitation    Umbilical hernia     Past Surgical History:  Procedure Laterality Date   BILIARY STENT PLACEMENT N/A 07/04/2020   Procedure: BILIARY STENT PLACEMENT;  Surgeon: Clarene Essex, MD;  Location: [facility];  Service: Endoscopy;  Laterality: N/A;   CATARACT EXTRACTION     ERCP N/A 07/04/2020   Procedure: ENDOSCOPIC RETROGRADE CHOLANGIOPANCREATOGRAPHY (ERCP);  Surgeon: Clarene Essex, MD;  Location: [facility];  Service: Endoscopy;  Laterality: N/A;   EUS N/A 07/04/2020   Procedure: UPPER ENDOSCOPIC ULTRASOUND (EUS) RADIAL;  Surgeon: Clarene Essex, MD;  Location: [facility];  Service: Endoscopy;  Laterality: N/A;   FINE NEEDLE ASPIRATION  07/04/2020   Procedure: FINE NEEDLE ASPIRATION (FNA) LINEAR;  Surgeon: Clarene Essex, MD;  Location: [facility];  Service: Endoscopy;;   IRRIGATION AND DEBRIDEMENT SEBACEOUS CYST     SPHINCTEROTOMY  07/04/2020   Procedure: SPHINCTEROTOMY;  Surgeon: Clarene Essex, MD;  Location: [facility];  Service: Endoscopy;;    Social History   Socioeconomic History   Marital status: Married    Spouse name: Not on file   Number of children: Not on file   Years of education: Not on file   Highest education level: Not on file  Occupational History   Not on file  Tobacco Use   Smoking status: Former    Years: 10.00    Types: Cigarettes    Quit date: 01/08/1968    Years since quitting: 52.5   Smokeless tobacco: Never  Substance and Sexual Activity   Alcohol use: Yes    Comment: "very  moderate"   Drug use: No   Sexual activity: Not on file  Other Topics Concern   Not on file  Social History Narrative   Not on file   Social Determinants of Health   Financial Resource Strain: Not on file  Food Insecurity: Not on file  Transportation Needs: Not on file  Physical Activity: Not on file  Stress: Not on file  Social Connections: Not on file     FAMILY HISTORY:  We obtained a detailed, 4-generation family history.  Significant diagnoses are listed below: Family History  Problem Relation Age of Onset   Breast cancer Paternal Aunt        dx after 84   Lymphoma Paternal Grandfather    Breast cancer Niece        dx 83s   Colon  cancer Nephew        d. 77s    Barry Contreras is unaware of previous family history of genetic testing for hereditary cancer risks. There is no reported Ashkenazi Jewish ancestry. There is no known consanguinity.  GENETIC COUNSELING ASSESSMENT: Barry Contreras is a 87 y.o. male with a personal and family history of cancer which is somewhat suggestive of a hereditary cancer syndrome and predisposition to cancer given his diagnosis of pancreatic cancer and the presence of related cancers in the family. We, therefore, discussed and recommended the following at today's visit.   DISCUSSION: We discussed that 5 - 10% of cancer is hereditary, with most cases of hereditary pancreatic cancer associated with mutations in BRCA1/2.  There are other genes that can be associated with hereditary pancreatic cancer syndromes.  These include but are not limited to CDKN2A and ATM.  We discussed that testing is beneficial for several reasons including knowing how to follow individuals for their cancer risks, identifying whether potential treatment options such as PARP inhibitors would be beneficial, and understanding if other family members could be at risk for cancer and allowing them to undergo genetic testing.   We reviewed the characteristics, features and inheritance patterns of hereditary cancer syndromes. We also discussed genetic testing, including the appropriate family members to test, the process of testing, insurance coverage and turn-around-time for results. We discussed the implications of a negative, positive, carrier and/or variant of uncertain significant result. We recommended Barry Contreras pursue genetic testing for a panel that includes genes associated with pancreatic, breast, and colon cancer.   The CustomNext-Cancer +RNAinsight Panel offered by [facility] and includes sequencing and rearrangement analysis for the following 91 genes: AIP, ALK, APC, ATM, AXIN2, BAP1, BARD1, BLM, BMPR1A, BRCA1, BRCA2, BRIP1,  CDC73, CDH1, CDK4, CDKN1B, CDKN2A, CHEK2, CTNNA1, DICER1, FANCC, FH, FLCN, GALNT12, KIF1B, LZTR1, MAX, MEN1, MET, MLH1, MRE11A, MSH2, MSH3, MSH6, MUTYH, NBN, NF1, NF2, NTHL1, PALB2, PHOX2B, PMS2, POT1, PRKAR1A, PTCH1, PTEN, RAD50, RAD51C, RAD51D, RB1, RECQL, RET, SDHA, SDHAF2, SDHB, SDHC, SDHD, SMAD4, SMARCA4, SMARCB1, SMARCE1, STK11, SUFU, TMEM127, TP53, TSC1, TSC2, VHL and XRCC2 (sequencing and deletion/duplication); CASR, CFTR, CPA1, CTRC, EGFR, EGLN1, FAM175A, HOXB13, KIT, MITF, MLH3, PALLD, PDGFRA, POLD1, POLE, PRSS1, RINT1, RPS20, SPINK1 and TERT (sequencing only); EPCAM and GREM1 (deletion/duplication only). RNA data is routinely analyzed for use in variant interpretation for all genes.  Based on Barry Contreras personal history of cancer, he meets medical criteria for genetic testing. Despite that he meets criteria, he may still have an out of pocket cost. We discussed that if his out of pocket cost for testing is over $100, the laboratory will call him to discuss self-pay  options or patient pay assistance programs.   PLAN: After considering the risks, benefits, and limitations, Barry Contreras provided informed consent to pursue genetic testing and the blood sample was sent to [facility] for analysis of the CustomNext-Cancer +RNAinsight Panel. Results should be available within approximately 3 weeks' time, at which point they will be disclosed by telephone to Barry Contreras, as will any additional recommendations warranted by these results. Barry Contreras will receive a summary of his genetic counseling visit and a copy of his results once available. This information will also be available in Epic.   Lastly, we encouraged Barry Contreras to remain in contact with cancer genetics annually so that we can continuously update the family history and inform him of any changes in cancer genetics and testing that may be of benefit for this family.   Barry Contreras questions were answered to his satisfaction today. Our  contact information was provided should additional questions or concerns arise. Thank you for the referral and allowing [place] to share in the care of your patient.   Calaya Gildner M. Joette Catching, [place], [facility] Genetic Counselor [email or web address] (P) [phone]  The patient was seen for a total of 30 minutes in face-to-face genetic counseling.  The patient brought his wife and his friend.   Drs. Magrinat, Lindi Adie and/or Burr Medico were available to discuss this case as needed.    _______________________________________________________________________ For Office Staff:  Number of people involved in session: 3 Was an Intern/ student involved with case: no

## 2020-07-27 NOTE — Telephone Encounter (Signed)
Called pt per 7/21 sch msg - no answer vmail full on mobile /. Called home phone - pt answer is aware of appt on 8/4 .

## 2020-07-27 NOTE — Telephone Encounter (Signed)
Called and spoke w/patient and wife regarding questions from yesterday. Dr. Benay Spice said lesion on liver is too small for PET scan and that it would not change his treatment. OK to move his 7/29 treatment to 08/10/20. He would like to have it moved. Scheduling message sent.

## 2020-07-29 ENCOUNTER — Other Ambulatory Visit: Payer: Self-pay | Admitting: Oncology

## 2020-08-02 ENCOUNTER — Other Ambulatory Visit: Payer: Self-pay | Admitting: Student

## 2020-08-02 ENCOUNTER — Ambulatory Visit: Payer: Medicare PPO

## 2020-08-02 ENCOUNTER — Other Ambulatory Visit: Payer: Medicare PPO

## 2020-08-02 ENCOUNTER — Ambulatory Visit: Payer: Medicare PPO | Admitting: Nurse Practitioner

## 2020-08-03 ENCOUNTER — Inpatient Hospital Stay (HOSPITAL_COMMUNITY): Admission: RE | Admit: 2020-08-03 | Payer: Medicare PPO | Source: Ambulatory Visit

## 2020-08-03 ENCOUNTER — Ambulatory Visit (HOSPITAL_COMMUNITY): Payer: Medicare PPO

## 2020-08-03 ENCOUNTER — Telehealth: Payer: Self-pay | Admitting: *Deleted

## 2020-08-03 NOTE — Telephone Encounter (Addendum)
Call from [place] in IR--patient forgot and ate banana bread and glass of milk this morning, so port placement has been cancelled. 1st available at [facility] is 8/10 and wife is not happy. Call transferred to RN and wife requests port on 8/4 or 8/5 due to family being here until then. Will also need to reschedule chemotherapy. Left VM with [facility] IR scheduler regarding earlier port insertion date per family need. Return VM from [place] w/[facility]...could place port o 8/4 or 8/5--call back to schedule. If not able to reach her today, call tomorrow and speak w/Jennifer at [phone]. Attempted to call back w/no answer--will f/u tomorrow.

## 2020-08-04 ENCOUNTER — Other Ambulatory Visit: Payer: Self-pay | Admitting: Oncology

## 2020-08-04 ENCOUNTER — Inpatient Hospital Stay: Payer: Medicare PPO | Admitting: Oncology

## 2020-08-04 ENCOUNTER — Telehealth: Payer: Self-pay | Admitting: *Deleted

## 2020-08-04 ENCOUNTER — Other Ambulatory Visit: Payer: Self-pay

## 2020-08-04 ENCOUNTER — Emergency Department (HOSPITAL_COMMUNITY): Payer: Medicare PPO

## 2020-08-04 ENCOUNTER — Inpatient Hospital Stay: Payer: Medicare PPO

## 2020-08-04 ENCOUNTER — Inpatient Hospital Stay (HOSPITAL_COMMUNITY)
Admission: EM | Admit: 2020-08-04 | Discharge: 2020-08-16 | DRG: 871 | Disposition: A | Discharge status: Home Health | Payer: Medicare PPO | Attending: Internal Medicine | Admitting: Internal Medicine

## 2020-08-04 ENCOUNTER — Encounter (HOSPITAL_COMMUNITY): Payer: Self-pay

## 2020-08-04 DIAGNOSIS — I2510 Atherosclerotic heart disease of native coronary artery without angina pectoris: Secondary | ICD-10-CM | POA: Diagnosis present

## 2020-08-04 DIAGNOSIS — D649 Anemia, unspecified: Secondary | ICD-10-CM | POA: Diagnosis present

## 2020-08-04 DIAGNOSIS — E871 Hypo-osmolality and hyponatremia: Secondary | ICD-10-CM | POA: Diagnosis present

## 2020-08-04 DIAGNOSIS — Z8673 Personal history of transient ischemic attack (TIA), and cerebral infarction without residual deficits: Secondary | ICD-10-CM | POA: Diagnosis not present

## 2020-08-04 DIAGNOSIS — C259 Malignant neoplasm of pancreas, unspecified: Secondary | ICD-10-CM

## 2020-08-04 DIAGNOSIS — D72829 Elevated white blood cell count, unspecified: Secondary | ICD-10-CM | POA: Diagnosis not present

## 2020-08-04 DIAGNOSIS — R0602 Shortness of breath: Secondary | ICD-10-CM | POA: Diagnosis not present

## 2020-08-04 DIAGNOSIS — J90 Pleural effusion, not elsewhere classified: Secondary | ICD-10-CM | POA: Diagnosis not present

## 2020-08-04 DIAGNOSIS — Z79899 Other long term (current) drug therapy: Secondary | ICD-10-CM

## 2020-08-04 DIAGNOSIS — A419 Sepsis, unspecified organism: Secondary | ICD-10-CM

## 2020-08-04 DIAGNOSIS — B952 Enterococcus as the cause of diseases classified elsewhere: Secondary | ICD-10-CM | POA: Diagnosis present

## 2020-08-04 DIAGNOSIS — Z8249 Family history of ischemic heart disease and other diseases of the circulatory system: Secondary | ICD-10-CM

## 2020-08-04 DIAGNOSIS — I10 Essential (primary) hypertension: Secondary | ICD-10-CM | POA: Diagnosis present

## 2020-08-04 DIAGNOSIS — H409 Unspecified glaucoma: Secondary | ICD-10-CM | POA: Diagnosis present

## 2020-08-04 DIAGNOSIS — B961 Klebsiella pneumoniae [K. pneumoniae] as the cause of diseases classified elsewhere: Secondary | ICD-10-CM | POA: Diagnosis present

## 2020-08-04 DIAGNOSIS — I110 Hypertensive heart disease with heart failure: Secondary | ICD-10-CM | POA: Diagnosis present

## 2020-08-04 DIAGNOSIS — K831 Obstruction of bile duct: Secondary | ICD-10-CM

## 2020-08-04 DIAGNOSIS — Y732 Prosthetic and other implants, materials and accessory gastroenterology and urology devices associated with adverse incidents: Secondary | ICD-10-CM | POA: Diagnosis present

## 2020-08-04 DIAGNOSIS — I480 Paroxysmal atrial fibrillation: Secondary | ICD-10-CM

## 2020-08-04 DIAGNOSIS — R5381 Other malaise: Secondary | ICD-10-CM | POA: Diagnosis not present

## 2020-08-04 DIAGNOSIS — G4730 Sleep apnea, unspecified: Secondary | ICD-10-CM | POA: Diagnosis not present

## 2020-08-04 DIAGNOSIS — R0609 Other forms of dyspnea: Secondary | ICD-10-CM | POA: Diagnosis not present

## 2020-08-04 DIAGNOSIS — E876 Hypokalemia: Secondary | ICD-10-CM | POA: Diagnosis present

## 2020-08-04 DIAGNOSIS — I4891 Unspecified atrial fibrillation: Secondary | ICD-10-CM | POA: Diagnosis not present

## 2020-08-04 DIAGNOSIS — E872 Acidosis: Secondary | ICD-10-CM | POA: Diagnosis present

## 2020-08-04 DIAGNOSIS — T85898A Other specified complication of other internal prosthetic devices, implants and grafts, initial encounter: Secondary | ICD-10-CM | POA: Diagnosis present

## 2020-08-04 DIAGNOSIS — R509 Fever, unspecified: Secondary | ICD-10-CM | POA: Diagnosis present

## 2020-08-04 DIAGNOSIS — R748 Abnormal levels of other serum enzymes: Secondary | ICD-10-CM | POA: Diagnosis not present

## 2020-08-04 DIAGNOSIS — I4821 Permanent atrial fibrillation: Secondary | ICD-10-CM | POA: Diagnosis present

## 2020-08-04 DIAGNOSIS — R1110 Vomiting, unspecified: Secondary | ICD-10-CM | POA: Diagnosis not present

## 2020-08-04 DIAGNOSIS — C787 Secondary malignant neoplasm of liver and intrahepatic bile duct: Secondary | ICD-10-CM | POA: Diagnosis present

## 2020-08-04 DIAGNOSIS — C250 Malignant neoplasm of head of pancreas: Secondary | ICD-10-CM | POA: Diagnosis present

## 2020-08-04 DIAGNOSIS — Z1611 Resistance to penicillins: Secondary | ICD-10-CM | POA: Diagnosis present

## 2020-08-04 DIAGNOSIS — R1011 Right upper quadrant pain: Secondary | ICD-10-CM

## 2020-08-04 DIAGNOSIS — T85590A Other mechanical complication of bile duct prosthesis, initial encounter: Secondary | ICD-10-CM | POA: Diagnosis not present

## 2020-08-04 DIAGNOSIS — Z20822 Contact with and (suspected) exposure to covid-19: Secondary | ICD-10-CM | POA: Diagnosis present

## 2020-08-04 DIAGNOSIS — R935 Abnormal findings on diagnostic imaging of other abdominal regions, including retroperitoneum: Secondary | ICD-10-CM | POA: Diagnosis not present

## 2020-08-04 DIAGNOSIS — K838 Other specified diseases of biliary tract: Secondary | ICD-10-CM

## 2020-08-04 DIAGNOSIS — J9601 Acute respiratory failure with hypoxia: Secondary | ICD-10-CM | POA: Diagnosis present

## 2020-08-04 DIAGNOSIS — A4159 Other Gram-negative sepsis: Principal | ICD-10-CM | POA: Diagnosis present

## 2020-08-04 DIAGNOSIS — R109 Unspecified abdominal pain: Secondary | ICD-10-CM | POA: Diagnosis not present

## 2020-08-04 DIAGNOSIS — Z743 Need for continuous supervision: Secondary | ICD-10-CM | POA: Diagnosis not present

## 2020-08-04 DIAGNOSIS — B370 Candidal stomatitis: Secondary | ICD-10-CM | POA: Diagnosis present

## 2020-08-04 DIAGNOSIS — R17 Unspecified jaundice: Secondary | ICD-10-CM

## 2020-08-04 DIAGNOSIS — H3530 Unspecified macular degeneration: Secondary | ICD-10-CM | POA: Diagnosis present

## 2020-08-04 DIAGNOSIS — I719 Aortic aneurysm of unspecified site, without rupture: Secondary | ICD-10-CM | POA: Diagnosis present

## 2020-08-04 DIAGNOSIS — R0603 Acute respiratory distress: Secondary | ICD-10-CM

## 2020-08-04 DIAGNOSIS — R7989 Other specified abnormal findings of blood chemistry: Secondary | ICD-10-CM

## 2020-08-04 DIAGNOSIS — K8309 Other cholangitis: Secondary | ICD-10-CM | POA: Diagnosis present

## 2020-08-04 DIAGNOSIS — R7881 Bacteremia: Secondary | ICD-10-CM | POA: Diagnosis not present

## 2020-08-04 DIAGNOSIS — R918 Other nonspecific abnormal finding of lung field: Secondary | ICD-10-CM | POA: Diagnosis not present

## 2020-08-04 DIAGNOSIS — A414 Sepsis due to anaerobes: Secondary | ICD-10-CM | POA: Diagnosis not present

## 2020-08-04 DIAGNOSIS — R112 Nausea with vomiting, unspecified: Secondary | ICD-10-CM | POA: Diagnosis not present

## 2020-08-04 DIAGNOSIS — R5383 Other fatigue: Secondary | ICD-10-CM | POA: Diagnosis not present

## 2020-08-04 DIAGNOSIS — J698 Pneumonitis due to inhalation of other solids and liquids: Secondary | ICD-10-CM | POA: Diagnosis not present

## 2020-08-04 DIAGNOSIS — R531 Weakness: Secondary | ICD-10-CM | POA: Diagnosis not present

## 2020-08-04 DIAGNOSIS — Z7901 Long term (current) use of anticoagulants: Secondary | ICD-10-CM

## 2020-08-04 DIAGNOSIS — Z452 Encounter for adjustment and management of vascular access device: Secondary | ICD-10-CM | POA: Diagnosis not present

## 2020-08-04 DIAGNOSIS — I5031 Acute diastolic (congestive) heart failure: Secondary | ICD-10-CM

## 2020-08-04 DIAGNOSIS — K8689 Other specified diseases of pancreas: Secondary | ICD-10-CM | POA: Diagnosis not present

## 2020-08-04 DIAGNOSIS — I712 Thoracic aortic aneurysm, without rupture: Secondary | ICD-10-CM | POA: Diagnosis present

## 2020-08-04 DIAGNOSIS — Z419 Encounter for procedure for purposes other than remedying health state, unspecified: Secondary | ICD-10-CM

## 2020-08-04 DIAGNOSIS — I517 Cardiomegaly: Secondary | ICD-10-CM | POA: Diagnosis not present

## 2020-08-04 HISTORY — DX: Permanent atrial fibrillation: I48.21

## 2020-08-04 HISTORY — DX: Malignant (primary) neoplasm, unspecified: C80.1

## 2020-08-04 LAB — CBC WITH DIFFERENTIAL/PLATELET
Abs Immature Granulocytes: 0.11 10*3/uL — ABNORMAL HIGH (ref 0.00–0.07)
Basophils Absolute: 0 10*3/uL (ref 0.0–0.1)
Basophils Relative: 0 %
Eosinophils Absolute: 0 10*3/uL (ref 0.0–0.5)
Eosinophils Relative: 0 %
HCT: 39.5 % (ref 39.0–52.0)
Hemoglobin: 12.6 g/dL — ABNORMAL LOW (ref 13.0–17.0)
Immature Granulocytes: 1 %
Lymphocytes Relative: 6 %
Lymphs Abs: 1.1 10*3/uL (ref 0.7–4.0)
MCH: 30.1 pg (ref 26.0–34.0)
MCHC: 31.9 g/dL (ref 30.0–36.0)
MCV: 94.3 fL (ref 80.0–100.0)
Monocytes Absolute: 1 10*3/uL (ref 0.1–1.0)
Monocytes Relative: 6 %
Neutro Abs: 15 10*3/uL — ABNORMAL HIGH (ref 1.7–7.7)
Neutrophils Relative %: 87 %
Platelets: 297 10*3/uL (ref 150–400)
RBC: 4.19 MIL/uL — ABNORMAL LOW (ref 4.22–5.81)
RDW: 14.1 % (ref 11.5–15.5)
WBC: 17.2 10*3/uL — ABNORMAL HIGH (ref 4.0–10.5)
nRBC: 0 % (ref 0.0–0.2)

## 2020-08-04 LAB — COMPREHENSIVE METABOLIC PANEL
ALT: 220 U/L — ABNORMAL HIGH (ref 0–44)
AST: 192 U/L — ABNORMAL HIGH (ref 15–41)
Albumin: 3.7 g/dL (ref 3.5–5.0)
Alkaline Phosphatase: 263 U/L — ABNORMAL HIGH (ref 38–126)
Anion gap: 12 (ref 5–15)
BUN: 15 mg/dL (ref 8–23)
CO2: 24 mmol/L (ref 22–32)
Calcium: 9.6 mg/dL (ref 8.9–10.3)
Chloride: 100 mmol/L (ref 98–111)
Creatinine, Ser: 0.89 mg/dL (ref 0.61–1.24)
GFR, Estimated: >60 mL/min (ref >60)
Glucose, Bld: 142 mg/dL — ABNORMAL HIGH (ref 70–99)
Potassium: 4 mmol/L (ref 3.5–5.1)
Sodium: 136 mmol/L (ref 135–145)
Total Bilirubin: 2.4 mg/dL — ABNORMAL HIGH (ref 0.3–1.2)
Total Protein: 7.1 g/dL (ref 6.5–8.1)

## 2020-08-04 LAB — PROTIME-INR
INR: 1.4 — ABNORMAL HIGH (ref 0.8–1.2)
Prothrombin Time: 16.7 seconds — ABNORMAL HIGH (ref 11.4–15.2)

## 2020-08-04 LAB — RESP PANEL BY RT-PCR (FLU A&B, COVID) ARPGX2
Influenza A by PCR: NEGATIVE
Influenza B by PCR: NEGATIVE
SARS Coronavirus 2 by RT PCR: NEGATIVE

## 2020-08-04 LAB — LACTIC ACID, PLASMA
Lactic Acid, Venous: 3.4 mmol/L (ref 0.5–1.9)
Lactic Acid, Venous: 2.5 mmol/L (ref 0.5–1.9)
Lactic Acid, Venous: 5.3 mmol/L (ref 0.5–1.9)

## 2020-08-04 LAB — APTT: aPTT: 38 seconds — ABNORMAL HIGH (ref 24–36)

## 2020-08-04 LAB — LIPASE, BLOOD: Lipase: 36 U/L (ref 11–51)

## 2020-08-04 MED ORDER — METOPROLOL TARTRATE 5 MG/5ML IV SOLN
5.0000 mg | Freq: Once | INTRAVENOUS | Status: AC
  Administered 2020-08-05: 5 mg via INTRAVENOUS
  Filled 2020-08-04: qty 5

## 2020-08-04 MED ORDER — IOHEXOL 350 MG/ML SOLN
100.0000 mL | Freq: Once | INTRAVENOUS | Status: AC | PRN
  Administered 2020-08-04: 80 mL via INTRAVENOUS

## 2020-08-04 MED ORDER — SODIUM CHLORIDE 0.9 % IV SOLN
2.0000 g | Freq: Two times a day (BID) | INTRAVENOUS | Status: DC
  Administered 2020-08-05: 2 g via INTRAVENOUS
  Filled 2020-08-04: qty 2

## 2020-08-04 MED ORDER — MORPHINE SULFATE (PF) 4 MG/ML IV SOLN
4.0000 mg | Freq: Once | INTRAVENOUS | Status: AC
  Administered 2020-08-04: 4 mg via INTRAVENOUS
  Filled 2020-08-04: qty 1

## 2020-08-04 MED ORDER — ACETAMINOPHEN 325 MG PO TABS
650.0000 mg | ORAL_TABLET | Freq: Once | ORAL | Status: AC
  Administered 2020-08-04: 650 mg via ORAL
  Filled 2020-08-04: qty 2

## 2020-08-04 MED ORDER — SODIUM CHLORIDE 0.9 % IV SOLN: INTRAVENOUS | Status: AC

## 2020-08-04 MED ORDER — DILTIAZEM HCL ER COATED BEADS 120 MG PO CP24
120.0000 mg | ORAL_CAPSULE | Freq: Every day | ORAL | Status: DC
  Administered 2020-08-05 – 2020-08-08 (×5): 120 mg via ORAL
  Filled 2020-08-04 (×5): qty 1

## 2020-08-04 MED ORDER — TRAMADOL HCL 50 MG PO TABS
50.0000 mg | ORAL_TABLET | Freq: Four times a day (QID) | ORAL | Status: DC | PRN
  Administered 2020-08-05 – 2020-08-10 (×11): 50 mg via ORAL
  Filled 2020-08-04 (×11): qty 1

## 2020-08-04 MED ORDER — METOPROLOL TARTRATE 50 MG PO TABS
50.0000 mg | ORAL_TABLET | Freq: Two times a day (BID) | ORAL | Status: DC
  Administered 2020-08-05 – 2020-08-14 (×21): 50 mg via ORAL
  Filled 2020-08-04 (×2): qty 2
  Filled 2020-08-04: qty 1
  Filled 2020-08-04 (×2): qty 2
  Filled 2020-08-04 (×4): qty 1
  Filled 2020-08-04 (×3): qty 2
  Filled 2020-08-04: qty 1
  Filled 2020-08-04 (×3): qty 2
  Filled 2020-08-04: qty 1
  Filled 2020-08-04 (×4): qty 2
  Filled 2020-08-04: qty 1

## 2020-08-04 MED ORDER — APIXABAN 5 MG PO TABS
5.0000 mg | ORAL_TABLET | Freq: Two times a day (BID) | ORAL | Status: DC
  Administered 2020-08-05 – 2020-08-09 (×11): 5 mg via ORAL
  Filled 2020-08-04 (×11): qty 1

## 2020-08-04 MED ORDER — SODIUM CHLORIDE 0.9 % IV SOLN
2.0000 g | Freq: Once | INTRAVENOUS | Status: AC
  Administered 2020-08-04: 2 g via INTRAVENOUS
  Filled 2020-08-04: qty 2

## 2020-08-04 MED ORDER — ACETAMINOPHEN 325 MG PO TABS
650.0000 mg | ORAL_TABLET | Freq: Four times a day (QID) | ORAL | Status: DC | PRN
  Administered 2020-08-05 – 2020-08-15 (×7): 650 mg via ORAL
  Filled 2020-08-04 (×7): qty 2

## 2020-08-04 MED ORDER — METRONIDAZOLE 500 MG/100ML IV SOLN
500.0000 mg | Freq: Three times a day (TID) | INTRAVENOUS | Status: DC
  Administered 2020-08-05 (×2): 500 mg via INTRAVENOUS
  Filled 2020-08-04 (×3): qty 100

## 2020-08-04 MED ORDER — SODIUM CHLORIDE 0.9 % IV BOLUS (SEPSIS)
250.0000 mL | Freq: Once | INTRAVENOUS | Status: AC
  Administered 2020-08-04: 250 mL via INTRAVENOUS

## 2020-08-04 MED ORDER — METRONIDAZOLE 500 MG/100ML IV SOLN
500.0000 mg | Freq: Once | INTRAVENOUS | Status: AC
  Administered 2020-08-04: 500 mg via INTRAVENOUS
  Filled 2020-08-04: qty 100

## 2020-08-04 MED ORDER — DILTIAZEM HCL ER COATED BEADS 120 MG PO TB24: 120.0000 mg | ORAL_TABLET | Freq: Every day | ORAL | Status: DC

## 2020-08-04 MED ORDER — ONDANSETRON HCL 4 MG/2ML IJ SOLN
4.0000 mg | Freq: Four times a day (QID) | INTRAMUSCULAR | Status: DC | PRN
  Administered 2020-08-04 – 2020-08-11 (×2): 4 mg via INTRAVENOUS
  Filled 2020-08-04: qty 2

## 2020-08-04 MED ORDER — SODIUM CHLORIDE 0.9 % IV BOLUS (SEPSIS)
1000.0000 mL | Freq: Once | INTRAVENOUS | Status: AC
  Administered 2020-08-04: 1000 mL via INTRAVENOUS

## 2020-08-04 NOTE — Telephone Encounter (Signed)
Spoke w/wife at 1:40 pm: She just checked his temp again and it is 100.0. Gave him Tylenol 325 mg. He is still sleeping in bed and has not eaten anything. Has has ~ 1/2 quart water today. Not in pain or having nausea. Home COVID test was negative. Reminded her to take him to hospital for fever, vomiting, dark urine, yellowing in eyes or if he is too lethargic and does not rouse up this evening to eat/drink. Her daughter will be there in about 1.5 hours and requests nurse call her then. Also left 2nd voice mail for [facility] IR scheduling to get port placed on 8/4 or 8/5

## 2020-08-04 NOTE — ED Notes (Signed)
Patient transported to CT 

## 2020-08-04 NOTE — Telephone Encounter (Signed)
Patient had called [facility] at [phone] this morning to report he had an episode during the night of feeling cold w/chills and temp 100.4. Called Mr. Anstett and he reports he took Tylenol 650 mg at 0500 today and 325 mg at 0800 today and chills are gone. Temp still 100.0 now. Had sudden episode of N/V during the night, but feels OK now. Reports his urine is pale yellow and stools are brown and having no abdominal pain. Denies any shortness of breath. Encouraged him to call if fever of 100.5 or greater returns, push fluids and to do a home COVID test today and call us with results. He understands and agrees.

## 2020-08-04 NOTE — ED Provider Notes (Signed)
Emergency Medicine Provider Triage Evaluation Note  Barry Contreras. , a 87 y.o. male  was evaluated in triage.  Pt complains of lethargic.  Review of Systems  Positive: Abd pain, lethargy, fever, nausea Negative: Cp, sob, cough, dysuria  Physical Exam  BP (!) 142/95 (BP Location: Left Arm)   Pulse (!) 124   Temp 98 F (36.7 C) (Oral)   Resp 15   Ht '5\' 2"'$  (1.575 m)   Wt 73.5 kg   SpO2 97%   BMI 29.63 kg/m  Gen:   Awake, no distress   Resp:  Normal effort  MSK:   Moves extremities without difficulty  Other:    Medical Decision Making  Medically screening exam initiated at 4:25 PM.  Appropriate orders placed.  Barry Contreras. was informed that the remainder of the evaluation will be completed by another provider, this initial triage assessment does not replace that evaluation, and the importance of remaining in the ED until their evaluation is complete.  Patient with history of pancreatic cancer recently diagnosed, also had a biliary stent placed last month by Dr. Watt Climes who presents with feeling lethargy, abdominal pain, nausea, darker urine and vomiting since yesterday.  Concern for potential biliary stent blockage.  Had a negative home COVID test today.   Domenic Moras, PA-C 08/04/20 1627    Charlesetta Shanks, MD 08/04/20 [phone]

## 2020-08-04 NOTE — ED Provider Notes (Signed)
[facility] DEPT Provider Note   CSN: [id number] Arrival date & time: 08/04/20  1539     History No chief complaint on file.   Barry Whichard. is a 87 y.o. male with Pmhx HTN and recent pancreatic cancer (plans to start chemo this month s/p biliary stent) who presents to the ED today with complaints of fevers that began yesterday. Daughter also reports generalized weakness with nausea and NBNB emesis. Pt was complaining of mild pain to his abdomen yesterday. She thought his feet appeared more yellow today prompting ED visit with concern for possible infected biliary stent which was placed recently. Daughter called the oncologist today who advised that they take a COVID test at home; which returned negative and then advised to come to the ED for further eval. Pt states his pain is mild at this time. He last had a normal bowel movement this morning. No other complaints at this time.   The history is provided by the patient and a relative.      Past Medical History:  Diagnosis Date   Cancer ([place])    Diverticulitis    Family history of breast cancer 07/27/2020   Family history of colon cancer 07/27/2020   Hyperglycemia    Hypertension    Inguinal hernia    Macular degeneration    Mitral regurgitation    Umbilical hernia     Patient Active Problem List   Diagnosis Date Noted   Family history of breast cancer 07/27/2020   Family history of colon cancer 07/27/2020   Goals of care, counseling/discussion 07/24/2020   Cancer of head of pancreas ([place]) 07/13/2020   Hardening of the aorta (main artery of the heart) ([place]) 07/05/2020   Hypercalcemia 07/05/2020   Hypertension 07/05/2020   Macular degeneration 07/05/2020   Mitral regurgitation 07/05/2020   Paroxysmal atrial fibrillation ([place]) 07/05/2020   Prediabetes 07/05/2020   Sleep apnea 07/05/2020   Thoracic aortic aneurysm without rupture ([place]) 07/05/2020   Thrombophilia ([place]) 07/05/2020   Primary  open angle glaucoma of right eye 10/01/2012   Nonexudative age-related macular degeneration 03/08/2011    Past Surgical History:  Procedure Laterality Date   BILIARY STENT PLACEMENT N/A 07/04/2020   Procedure: BILIARY STENT PLACEMENT;  Surgeon: Clarene Essex, MD;  Location: [facility];  Service: Endoscopy;  Laterality: N/A;   CATARACT EXTRACTION     ERCP N/A 07/04/2020   Procedure: ENDOSCOPIC RETROGRADE CHOLANGIOPANCREATOGRAPHY (ERCP);  Surgeon: Clarene Essex, MD;  Location: [facility];  Service: Endoscopy;  Laterality: N/A;   EUS N/A 07/04/2020   Procedure: UPPER ENDOSCOPIC ULTRASOUND (EUS) RADIAL;  Surgeon: Clarene Essex, MD;  Location: [facility];  Service: Endoscopy;  Laterality: N/A;   FINE NEEDLE ASPIRATION  07/04/2020   Procedure: FINE NEEDLE ASPIRATION (FNA) LINEAR;  Surgeon: Clarene Essex, MD;  Location: [facility];  Service: Endoscopy;;   IRRIGATION AND DEBRIDEMENT SEBACEOUS CYST     SPHINCTEROTOMY  07/04/2020   Procedure: SPHINCTEROTOMY;  Surgeon: Clarene Essex, MD;  Location: [facility];  Service: Endoscopy;;       Family History  Problem Relation Age of Onset   Hypertension Mother    Stroke Mother    Heart attack Mother    Hypertension Father    Alzheimer's disease Father    Pneumonia Father    Stroke Sister    Alzheimer's disease Sister    Congenital heart disease Brother    Breast cancer Paternal Aunt        dx after 48  Lymphoma Paternal Grandfather    Breast cancer Niece        dx 52s   Colon cancer Nephew        d. 75s    Social History   Tobacco Use   Smoking status: Former    Years: 10.00    Types: Cigarettes    Quit date: 01/08/1968    Years since quitting: 52.6   Smokeless tobacco: Never  Vaping Use   Vaping Use: Never used  Substance Use Topics   Alcohol use: Yes    Comment: rarely   Drug use: No    Home Medications Prior to Admission medications   Medication Sig Start Date End Date Taking? Authorizing Provider  acetaminophen (TYLENOL)  650 MG CR tablet Take 1,300 mg by mouth every 8 (eight) hours as needed for pain.   Yes [provider]  apixaban (ELIQUIS) 5 MG TABS tablet Take 5 mg by mouth 2 (two) times daily.   Yes [provider]  Apoaequorin (PREVAGEN PO) Take 1 tablet by mouth daily.   Yes [provider]  azelastine (ASTELIN) 0.1 % nasal spray Place 1 spray into both nostrils daily as needed for rhinitis (To keep ears open).   Yes [provider]  benazepril-hydrochlorthiazide (LOTENSIN HCT) 20-12.5 MG per tablet Take 1 tablet by mouth daily.     Yes [provider]  CARDIZEM LA 120 MG 24 hr tablet Take 120 mg by mouth daily.  06/17/19  Yes [provider]  dorzolamide-timolol (COSOPT) 22.3-6.8 MG/ML ophthalmic solution Place 2 drops into both eyes 2 (two) times daily.   Yes [provider]  ketotifen (ZADITOR) 0.025 % ophthalmic solution Place 1 drop into both eyes daily as needed (Clear and dry eyes).   Yes [provider]  Lysine 1000 MG TABS Take 1,000 mg by mouth daily.   Yes [provider]  metoprolol (LOPRESSOR) 50 MG tablet Take 50 mg by mouth 2 (two) times daily.     Yes [provider]  Multiple Vitamin (MULTIVITAMIN) tablet Take 1 tablet by mouth daily.     Yes [provider]  Multiple Vitamins-Minerals (VISION-VITE PRESERVE PO) Take 1 tablet by mouth 2 (two) times daily.   Yes [provider]  Saw Palmetto 450 MG CAPS Take 450 mg by mouth 2 (two) times daily.   Yes [provider]  traMADol (ULTRAM) 50 MG tablet Take 0.5-1 tablets (25-50 mg total) by mouth every 6 (six) hours as needed. Patient taking differently: Take 50 mg by mouth every 6 (six) hours as needed for severe pain. 07/24/20  Yes [name], MD  lidocaine-prilocaine (EMLA) cream Apply 1 application topically as directed. Apply 1/2 tablespoon to port 2 hours prior to stick and cover with plastic wrap to numb site 07/26/20    [name], MD  ondansetron (ZOFRAN) 8 MG tablet Take 1 tablet (8 mg total) by mouth every 8 (eight) hours as needed for nausea or vomiting. 07/26/20   [name], MD  prochlorperazine (COMPAZINE) 10 MG tablet Take 1 tablet (10 mg total) by mouth every 6 (six) hours as needed for nausea. 07/26/20   [name], MD    Allergies    Patient has no known allergies.  Review of Systems   Review of Systems  Constitutional:  Positive for chills, fatigue and fever.  Gastrointestinal:  Positive for abdominal pain, nausea and vomiting. Negative for diarrhea.  Neurological:  Positive for weakness (generalized).  All  other systems reviewed and are negative.  Physical Exam Updated Vital Signs BP 138/87   Pulse (!) 119   Temp 98 F (36.7 C) (Oral)   Resp (!) 27   Ht _0  (1.575 m)   Wt 73.5 kg   SpO2 97%   BMI 29.63 kg/m   Physical Exam Vitals and nursing note reviewed.  Constitutional:      Appearance: He is not ill-appearing or diaphoretic.  HENT:     Head: Normocephalic and atraumatic.     Mouth/Throat:     Mouth: Mucous membranes are dry.  Eyes:     Conjunctiva/sclera: Conjunctivae normal.  Cardiovascular:     Rate and Rhythm: Normal rate and regular rhythm.  Pulmonary:     Effort: Pulmonary effort is normal.     Breath sounds: Normal breath sounds. No wheezing, rhonchi or rales.  Abdominal:     Palpations: Abdomen is soft.     Tenderness: There is abdominal tenderness. There is no guarding or rebound.     Comments: Soft, mild diffuse upper abdominal TTP, worse in the RUQ, +BS throughout, no r/g/r, neg murphy's, neg mcburney's, no CVA TTP  Musculoskeletal:     Cervical back: Neck supple.  Skin:    General: Skin is warm and dry.     Coloration: Skin is jaundiced.  Neurological:     Mental Status: He is alert.    ED Results / Procedures / Treatments   Labs (all labs ordered are listed, but only abnormal results are displayed) Labs Reviewed  CBC WITH  DIFFERENTIAL/PLATELET - Abnormal; Notable for the following components:      Result Value   WBC 17.2 (*)    RBC 4.19 (*)    Hemoglobin 12.6 (*)    Neutro Abs 15.0 (*)    Abs Immature Granulocytes 0.11 (*)    All other components within normal limits  COMPREHENSIVE METABOLIC PANEL - Abnormal; Notable for the following components:   Glucose, Bld 142 (*)    AST 192 (*)    ALT 220 (*)    Alkaline Phosphatase 263 (*)    Total Bilirubin 2.4 (*)    All other components within normal limits  LACTIC ACID, PLASMA - Abnormal; Notable for the following components:   Lactic Acid, Venous 3.4 (*)    All other components within normal limits  LACTIC ACID, PLASMA - Abnormal; Notable for the following components:   Lactic Acid, Venous 2.5 (*)    All other components within normal limits  PROTIME-INR - Abnormal; Notable for the following components:   Prothrombin Time 16.7 (*)    INR 1.4 (*)    All other components within normal limits  APTT - Abnormal; Notable for the following components:   aPTT 38 (*)    All other components within normal limits  RESP PANEL BY RT-PCR (FLU A&B, COVID) ARPGX2  CULTURE, BLOOD (ROUTINE X 2)  CULTURE, BLOOD (ROUTINE X 2)  LIPASE, BLOOD  URINALYSIS, ROUTINE W REFLEX MICROSCOPIC    EKG None  Radiology CT ABDOMEN PELVIS W CONTRAST  Result Date: 08/04/2020 CLINICAL DATA:  Provided history of: Cholecystitis (Ped 0-18y) possible biliary stent obstruction Technologist notes state recent diagnosis of pancreatic cancer with biliary stent placed last month. Abdominal pain, nausea, vomiting, and lethargy. EXAM: CT ABDOMEN AND PELVIS WITH CONTRAST TECHNIQUE: Multidetector CT imaging of the abdomen and pelvis was performed using the standard protocol following bolus administration of intravenous contrast. CONTRAST:  56m OMNIPAQUE IOHEXOL 350 MG/ML SOLN COMPARISON:  Abdominopelvic CT 05/29/2020, MRI 06/17/2020 FINDINGS: Lower chest: Small right pleural effusion. Bibasilar  atelectasis. No basilar pulmonary nodule. Small hiatal hernia. Hepatobiliary: New ill-defined hypodensity in the periphery of the right lobe, series 2, image 14. Subtle low-density in the inferior right lobe series 2, image 33, at site of hypoenhancing lesion on MRI. Stable small subcapsular cyst in the right lobe, series 2, image 33. Biliary stent is in place with air and scattered fluid in the stent. No bile duct dilatation surround the stent. Resultant pneumobilia. There is no intrahepatic biliary ductal dilatation. Gallbladder is physiologically distended, there may be minimal gallbladder wall enhancement and wall thickening. No calcified gallstone. Pancreas: Ill-defined low-density in the pancreatic head with margins difficult to delineate, measuring approximately 3.3 x 2.3 cm, previously 3.6 x 2.6 cm. Mild atrophy of the distal pancreas. No ductal dilatation. No peripancreatic fat stranding. Spleen: Punctate granuloma.  Normal in size.  Splenule anteriorly. Adrenals/Urinary Tract: No adrenal nodule. Exophytic 4.7 cm cyst from the anterior right kidney. Smaller cyst in the lower right kidney. No hydronephrosis. No perinephric edema. There is symmetric renal excretion on delayed phase imaging. Minimally distended urinary bladder. Stomach/Bowel: Small to moderate hiatal hernia. Stomach is decompressed. There is no bowel obstruction or inflammation. Sigmoid diverticulosis without diverticulitis. Additional scattered diverticular seen in the more proximal colon. High-riding cecum in the right mid abdomen. Diminutive appendix tentatively visualized. No appendicitis. Vascular/Lymphatic: Aortic atherosclerosis and tortuosity. No aortic aneurysm. The portal, splenic, superior mesenteric veins are patent. Small peripancreatic nodes are not enlarged by size criteria. No enlarged lymph nodes in the abdomen or pelvis. Reproductive: Prostate is unremarkable. Other: Ascites. No free air or focal fluid collection. There is a  fat containing umbilical hernia. Moderate to large fat containing left inguinal hernia, small fat containing right inguinal hernia. 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 Fusion of L1 through L3 vertebral bodies. Trabeculated lesion within T10 is likely a hemangioma. No acute osseous abnormalities are seen. IMPRESSION: 1. Biliary stent in place with air and scattered fluid in the stent. No biliary dilatation. 2. Known pancreatic head mass with margins difficult to delineate, measuring approximately 3.3 x 2.3 cm, previously 3.6 x 2.6 cm. 3. New liver lesion suspicious for metastasis, there is a subtle hypodensity in the right lobe at site of suspected metastasis on MRI. 4. Slight gallbladder wall enhancement and wall thickening, nonspecific in the setting. 5. Small right pleural effusion. 6. Colonic diverticulosis without diverticulitis. 7. Hiatal hernia. 8. Fat containing umbilical and bilateral inguinal hernias. 9. 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 Aortic Atherosclerosis (ICD10-I70.0). Electronically Signed   By: Keith Rake M.D.   On: 08/04/2020 19:23   DG Chest Portable 1 View  Result Date: 08/04/2020 CLINICAL DATA:  Fatigue EXAM: PORTABLE CHEST 1 VIEW COMPARISON:  CT 07/21/2019 FINDINGS: Linear densities in the lung bases could reflect scarring or atelectasis. Heart is normal size. No effusions or acute bony abnormality. IMPRESSION: Bibasilar atelectasis or scarring Electronically Signed   By: Rolm Baptise M.D.   On: 08/04/2020 16:55   US Abdomen Limited RUQ (LIVER/GB)  Result Date: 08/04/2020 CLINICAL DATA:  Right upper quadrant pain EXAM: ULTRASOUND ABDOMEN LIMITED RIGHT UPPER QUADRANT COMPARISON:  CT earlier today FINDINGS: Gallbladder: Shadowing noted from the gallbladder fundus, likely due to gas within the gallbladder.  Gallbladder wall is thickened at 7 mm. No visible stones. Common bile duct: Diameter: Metallic biliary  stent noted. Unable to measure the common bile duct. Liver: Pneumobilia. No focal hepatic abnormality. Portal vein is patent on color Doppler imaging with normal direction of blood flow towards the liver. Other: None. IMPRESSION: Pneumobilia related to biliary stent. Gallbladder wall thickening.  No visible stones. Electronically Signed   By: Rolm Baptise M.D.   On: 08/04/2020 21:18    Procedures .Critical Care  Date/Time: 08/04/2020 9:56 PM Performed by: Eustaquio Maize, PA-C Authorized by: Eustaquio Maize, PA-C   Critical care provider statement:    Critical care time (minutes):  45   Critical care was necessary to treat or prevent imminent or life-threatening deterioration of the following conditions:  Sepsis   Critical care was time spent personally by me on the following activities:  Discussions with consultants, evaluation of patient's response to treatment, examination of patient, ordering and performing treatments and interventions, ordering and review of laboratory studies, ordering and review of radiographic studies, pulse oximetry, re-evaluation of patient's condition, obtaining history from patient or surrogate and review of old charts   Medications Ordered in ED Medications  0.9 %  sodium chloride infusion ( Intravenous New Bag/Given 08/04/20 1928)  iohexol (OMNIPAQUE) 350 MG/ML injection 100 mL (80 mLs Intravenous Contrast Given 08/04/20 1838)  sodium chloride 0.9 % bolus 1,000 mL (1,000 mLs Intravenous New Bag/Given 08/04/20 1929)    And  sodium chloride 0.9 % bolus 1,000 mL (1,000 mLs Intravenous New Bag/Given 08/04/20 1934)    And  sodium chloride 0.9 % bolus 250 mL (0 mLs Intravenous Stopped 08/04/20 2046)  ceFEPIme (MAXIPIME) 2 g in sodium chloride 0.9 % 100 mL IVPB (0 g Intravenous Stopped 08/04/20 2046)  metroNIDAZOLE (FLAGYL) IVPB 500 mg (0 mg Intravenous Stopped 08/04/20 2046)   morphine 4 MG/ML injection 4 mg (4 mg Intravenous Given 08/04/20 1929)    ED Course  I have reviewed the triage vital signs and the nursing notes.  Pertinent labs & imaging results that were available during my care of the patient were reviewed by me and considered in my medical decision making (see chart for details).  Clinical Course as of 08/04/20 2156  Fri Aug 04, 2020  2120 IMPRESSION: Pneumobilia related to biliary stent.  Gallbladder wall thickening. No visible stones. [MV]    Clinical Course User Index [MV] Eustaquio Maize, PA-C   MDM Rules/Calculators/A&P                           87 year old male who presents to the ED today with complaint of fevers, chills, nausea, vomiting, abdominal pain that began yesterday.  History of recent biliary stenting secondary to pancreatic cancer 1 month ago.  Plans to start chemotherapy this month.  On arrival to the ED today patient was afebrile.  He is tachycardic in the 120s, nontachypneic.  Blood pressure stable at 142/95.  His work-up was started in the waiting room including CBC, CMP, lipase, lactic acid.  BC is returned with a leukocytosis of 17,200 with left shift.  Hemoglobin stable at 12.6.  CMP with elevated LFTs.  AST 192, ALT 220, alk phos 263, T bili 2.4.  Work significantly elevated compared to previous labs 9 days ago.  lipase 36.  Lactic acid elevated 3.4.  Hepatic Function Latest Ref Rng & Units 08/04/2020 07/26/2020  Total Protein 6.5 - 8.1 g/dL 7.1 7.0  Albumin 3.5 - 5.0 g/dL 3.7 3.9  AST 15 - 41 U/L 192(H) 25  ALT 0 - 44 U/L 220(H)  36  Alk Phosphatase 38 - 126 U/L 263(H) 130(H)  Total Bilirubin 0.3 - 1.2 mg/dL 2.4(H) 2.2(H)   Lab work and presentation consistent with sepsis at this time.  Septic protocol started.  Will obtain blood cultures, initial lactic acid.  We will start on IV antibiotics and 30 cc per fluid bolus.  Suspected source of infection is intra-abdominal, cefepime and Flagyl started.  Patient did have a  chest x-ray waiting room which did show some bibasilar atelectasis/scarring.  CT abdomen pelvis ordered, pending at this time.  We will follow-up on CT scan.  Patient will require admission.  CT: IMPRESSION:  1. Biliary stent in place with air and scattered fluid in the stent.  No biliary dilatation.  2. Known pancreatic head mass with margins difficult to delineate,  measuring approximately 3.3 x 2.3 cm, previously 3.6 x 2.6 cm.  3. New liver lesion suspicious for metastasis, there is a subtle  hypodensity in the right lobe at site of suspected metastasis on  MRI.  4. Slight gallbladder wall enhancement and wall thickening,  nonspecific in the setting.  5. Small right pleural effusion.  6. Colonic diverticulosis without diverticulitis.  7. Hiatal hernia.  8. Fat containing umbilical and bilateral inguinal hernias.  9. Bilateral L5 pars interarticularis defects with anterolisthesis  of L5 on S1 and associated degenerative change.     Aortic Atherosclerosis (ICD10-I70.0).   Question if biliary stent air/fluid is secondary to recent placement. Will obtain RUQ ultrasound to assess for infection.   Ultrasound with findings of pneumobilia and gallbladder thickening.   Discussed case with Dr. Marcello Moores general surgeon who advised GI consult and admission. He is not a surgical candidate at this time given hx of pancreatic cancer.   Dr. Michail Sermon with [facility] to see in the AM. Recommends IV abx, medicine admission, and bowel rest.   Dr. Flossie Buffy [facility] to admit.   This note was prepared using Dragon voice recognition software and may include unintentional dictation errors due to the inherent limitations of voice recognition software.  Final Clinical Impression(s) / ED Diagnoses Final diagnoses:  RUQ abdominal pain  Sepsis, due to unspecified organism, unspecified whether acute organ dysfunction present [facility])  Pneumobilia    Rx / DC Orders ED Discharge Orders     None         Eustaquio Maize, PA-C 08/04/20 2156    Regan Lemming, MD 08/05/20 0010

## 2020-08-04 NOTE — H&P (Addendum)
History and Physical    Barry Contreras. PI:[id number] DOB: 1931-01-28 DOA: 08/04/2020  PCP: Lajean Manes, MD  Patient coming from:Home, daughter at bedside  I have personally briefly reviewed patient's old medical records in [place]  Chief Complaint: fever, nausea and vomiting  HPI: Barry Contreras. is a 87 y.o. male with medical history significant for pancreatic cancer with planned systemic chemotherapy, history of obstructive jaundice s/p biliary stent placement, paroxysmal atrial fibrillation on Eliquis, hypertension and prediabetes who presents with concerns of fever, nausea and vomiting.  Daughter at bedside provides history as patient was having chills and rigors and too ill to answer much questions.  Daughter is just coming into town to visit.  She states that yesterday he began to develop fever with chills.  Had 1 episode of nausea and vomiting.  No diarrhea.  He denies any abdominal pain.  No chest pain or shortness of breath. He has a history of obstructive jaundice and is status post biliary stent placement on 6/28 with [facility].   ED Course: He was febrile up to 102, tachycardic and mildly hypertensive.  WBC of 17.2.  Hemoglobin of 12.6.  Lactic acid of 3.4.  AST is elevated to 192, ALT of 220, alkaline phosphatase of 263.  LFTs were normal 9 days ago.  Total bilirubin is stable at 2.4.  CT abdomen showing pneumobilia with slight gallbladder wall enhancement and wall thickening. Dedicated right upper quadrant ultrasound showed gallbladder wall thickening with no visible stones.  ED PA discussed case with GI Dr. Michail Sermon who recommends patient be kept n.p.o. with IV antibiotics and will see in consultation in the morning.  General surgery was also consulted and recommend GI evaluation.  He was started on IV cefepime and Flagyl and hospitalist was called for admission.  Review of Systems: Constitutional: No Weight Change, No Fever ENT/Mouth: No sore throat, No  Rhinorrhea Eyes: No Eye Pain, No Vision Changes Cardiovascular: No Chest Pain, no SOB Respiratory: No Cough, No Sputum, No Wheezing, no Dyspnea  Gastrointestinal: + Nausea, + Vomiting, No Diarrhea, No Constipation, No Pain Genitourinary: no Urinary Incontinence, No Urgency, No Flank Pain Musculoskeletal: No Arthralgias, No Myalgias Skin: No Skin Lesions, No Pruritus, Neuro: no Weakness, No Numbness,   Psych: No Anxiety/Panic, No Depression, no decrease appetite Heme/Lymph: No Bruising, No Bleeding  Past Medical History:  Diagnosis Date   Cancer ([place])    Diverticulitis    Family history of breast cancer 07/27/2020   Family history of colon cancer 07/27/2020   Hyperglycemia    Hypertension    Inguinal hernia    Macular degeneration    Mitral regurgitation    Umbilical hernia     Past Surgical History:  Procedure Laterality Date   BILIARY STENT PLACEMENT N/A 07/04/2020   Procedure: BILIARY STENT PLACEMENT;  Surgeon: Clarene Essex, MD;  Location: [facility];  Service: Endoscopy;  Laterality: N/A;   CATARACT EXTRACTION     ERCP N/A 07/04/2020   Procedure: ENDOSCOPIC RETROGRADE CHOLANGIOPANCREATOGRAPHY (ERCP);  Surgeon: Clarene Essex, MD;  Location: [facility];  Service: Endoscopy;  Laterality: N/A;   EUS N/A 07/04/2020   Procedure: UPPER ENDOSCOPIC ULTRASOUND (EUS) RADIAL;  Surgeon: Clarene Essex, MD;  Location: [facility];  Service: Endoscopy;  Laterality: N/A;   FINE NEEDLE ASPIRATION  07/04/2020   Procedure: FINE NEEDLE ASPIRATION (FNA) LINEAR;  Surgeon: Clarene Essex, MD;  Location: [facility];  Service: Endoscopy;;   IRRIGATION AND DEBRIDEMENT SEBACEOUS CYST     SPHINCTEROTOMY  07/04/2020   Procedure: SPHINCTEROTOMY;  Surgeon: Clarene Essex, MD;  Location: [facility];  Service: Endoscopy;;     reports that he quit smoking about 52 years ago. His smoking use included cigarettes. He has never used smokeless tobacco. He reports current alcohol use. He reports that he does not use  drugs. Social History  No Known Allergies  Family History  Problem Relation Age of Onset   Hypertension Mother    Stroke Mother    Heart attack Mother    Hypertension Father    Alzheimer's disease Father    Pneumonia Father    Stroke Sister    Alzheimer's disease Sister    Congenital heart disease Brother    Breast cancer Paternal Aunt        dx after 30   Lymphoma Paternal Grandfather    Breast cancer Niece        dx 9s   Colon cancer Nephew        d. 41s     Prior to Admission medications   Medication Sig Start Date End Date Taking? Authorizing Provider  acetaminophen (TYLENOL) 650 MG CR tablet Take 1,300 mg by mouth every 8 (eight) hours as needed for pain.   Yes [provider]  apixaban (ELIQUIS) 5 MG TABS tablet Take 5 mg by mouth 2 (two) times daily.   Yes [provider]  Apoaequorin (PREVAGEN PO) Take 1 tablet by mouth daily.   Yes [provider]  azelastine (ASTELIN) 0.1 % nasal spray Place 1 spray into both nostrils daily as needed for rhinitis (To keep ears open).   Yes [provider]  benazepril-hydrochlorthiazide (LOTENSIN HCT) 20-12.5 MG per tablet Take 1 tablet by mouth daily.     Yes [provider]  CARDIZEM LA 120 MG 24 hr tablet Take 120 mg by mouth daily.  06/17/19  Yes [provider]  dorzolamide-timolol (COSOPT) 22.3-6.8 MG/ML ophthalmic solution Place 2 drops into both eyes 2 (two) times daily.   Yes [provider]  ketotifen (ZADITOR) 0.025 % ophthalmic solution Place 1 drop into both eyes daily as needed (Clear and dry eyes).   Yes [provider]  Lysine 1000 MG TABS Take 1,000 mg by mouth daily.   Yes [provider]  metoprolol (LOPRESSOR) 50 MG tablet Take 50 mg by mouth 2 (two) times daily.     Yes [provider]  Multiple Vitamin (MULTIVITAMIN) tablet Take 1 tablet by mouth daily.     Yes [provider]  Multiple Vitamins-Minerals (VISION-VITE  PRESERVE PO) Take 1 tablet by mouth 2 (two) times daily.   Yes [provider]  Saw Palmetto 450 MG CAPS Take 450 mg by mouth 2 (two) times daily.   Yes [provider]  traMADol (ULTRAM) 50 MG tablet Take 0.5-1 tablets (25-50 mg total) by mouth every 6 (six) hours as needed. Patient taking differently: Take 50 mg by mouth every 6 (six) hours as needed for severe pain. 07/24/20  Yes [name], MD  lidocaine-prilocaine (EMLA) cream Apply 1 application topically as directed. Apply 1/2 tablespoon to port 2 hours prior to stick and cover with plastic wrap to numb site 07/26/20   [name], MD  ondansetron (ZOFRAN) 8 MG tablet Take 1 tablet (8 mg total) by mouth every 8 (eight) hours as needed for nausea or vomiting. 07/26/20   [name], MD  prochlorperazine (COMPAZINE) 10 MG tablet Take 1 tablet (10 mg total) by mouth every  6 (six) hours as needed for nausea. 07/26/20   [name], MD    Physical Exam: Vitals:   08/04/20 2030 08/04/20 2100 08/04/20 2130 08/04/20 2242  BP: 121/72 127/80 138/87   Pulse: (!) 126 (!) 115 (!) 119   Resp: 16 (!) 26 (!) 27   Temp:    (S) (!) 102.3 F (39.1 C)  TempSrc:    (S) Rectal  SpO2: 97% 96% 97%   Weight:      Height:        Constitutional: NAD, ill-appearing elderly male laying in bed with rigors Vitals:   08/04/20 2030 08/04/20 2100 08/04/20 2130 08/04/20 2242  BP: 121/72 127/80 138/87   Pulse: (!) 126 (!) 115 (!) 119   Resp: 16 (!) 26 (!) 27   Temp:    (S) (!) 102.3 F (39.1 C)  TempSrc:    (S) Rectal  SpO2: 97% 96% 97%   Weight:      Height:       Eyes: PERRL, sclera icterus bilaterally ENMT: Mucous membranes are moist.  Neck: normal, supple Respiratory: clear to auscultation bilaterally, no wheezing, no crackles. Normal respiratory effort. No accessory muscle use.  Cardiovascular: Regular rate and rhythm, no murmurs / rubs / gallops. No extremity edema.  Abdomen: no tenderness, no masses palpated.   Bowel sounds positive.  Musculoskeletal: no clubbing / cyanosis. No joint deformity upper and lower extremities. Good ROM, no contractures. Normal muscle tone.  Skin: no rashes, lesions, ulcers. No induration Neurologic: CN 2-12 grossly intact. Sensation intact. Strength 5/5 in all 4.  Psychiatric: Normal judgment and insight. Alert and oriented x 3. Normal mood.    Labs on Admission: I have personally reviewed following labs and imaging studies  CBC: Recent Labs  Lab 08/04/20 1704  WBC 17.2*  NEUTROABS 15.0*  HGB 12.6*  HCT 39.5  MCV 94.3  PLT [id number]   Basic Metabolic Panel: Recent Labs  Lab 08/04/20 1704  NA 136  K 4.0  CL 100  CO2 24  GLUCOSE 142*  BUN 15  CREATININE 0.89  CALCIUM 9.6   GFR: Estimated Creatinine Clearance: 52.4 mL/min (by C-G formula based on SCr of 0.89 mg/dL). Liver Function Tests: Recent Labs  Lab 08/04/20 1704  AST 192*  ALT 220*  ALKPHOS 263*  BILITOT 2.4*  PROT 7.1  ALBUMIN 3.7   Recent Labs  Lab 08/04/20 1704  LIPASE 36   No results for input(s): AMMONIA in the last 168 hours. Coagulation Profile: Recent Labs  Lab 08/04/20 1858  INR 1.4*   Cardiac Enzymes: No results for input(s): CKTOTAL, CKMB, CKMBINDEX, TROPONINI in the last 168 hours. BNP (last 3 results) No results for input(s): PROBNP in the last 8760 hours. HbA1C: No results for input(s): HGBA1C in the last 72 hours. CBG: No results for input(s): GLUCAP in the last 168 hours. Lipid Profile: No results for input(s): CHOL, HDL, LDLCALC, TRIG, CHOLHDL, LDLDIRECT in the last 72 hours. Thyroid Function Tests: No results for input(s): TSH, T4TOTAL, FREET4, T3FREE, THYROIDAB in the last 72 hours. Anemia Panel: No results for input(s): VITAMINB12, FOLATE, FERRITIN, TIBC, IRON, RETICCTPCT in the last 72 hours. Urine analysis: No results found for: COLORURINE, APPEARANCEUR, LABSPEC, [place], GLUCOSEU, HGBUR, [place], [place], PROTEINUR, UROBILINOGEN, NITRITE,  LEUKOCYTESUR  Radiological Exams on Admission: CT ABDOMEN PELVIS W CONTRAST  Result Date: 08/04/2020 CLINICAL DATA:  Provided history of: Cholecystitis (Ped 0-18y) possible biliary stent obstruction Technologist notes state recent diagnosis of pancreatic cancer with biliary stent placed  last month. Abdominal pain, nausea, vomiting, and lethargy. EXAM: CT ABDOMEN AND PELVIS WITH CONTRAST TECHNIQUE: Multidetector CT imaging of the abdomen and pelvis was performed using the standard protocol following bolus administration of intravenous contrast. CONTRAST:  58m OMNIPAQUE IOHEXOL 350 MG/ML SOLN COMPARISON:  Abdominopelvic CT 05/29/2020, MRI 06/17/2020 FINDINGS: Lower chest: Small right pleural effusion. Bibasilar atelectasis. No basilar pulmonary nodule. Small hiatal hernia. Hepatobiliary: New ill-defined hypodensity in the periphery of the right lobe, series 2, image 14. Subtle low-density in the inferior right lobe series 2, image 33, at site of hypoenhancing lesion on MRI. Stable small subcapsular cyst in the right lobe, series 2, image 33. Biliary stent is in place with air and scattered fluid in the stent. No bile duct dilatation surround the stent. Resultant pneumobilia. There is no intrahepatic biliary ductal dilatation. Gallbladder is physiologically distended, there may be minimal gallbladder wall enhancement and wall thickening. No calcified gallstone. Pancreas: Ill-defined low-density in the pancreatic head with margins difficult to delineate, measuring approximately 3.3 x 2.3 cm, previously 3.6 x 2.6 cm. Mild atrophy of the distal pancreas. No ductal dilatation. No peripancreatic fat stranding. Spleen: Punctate granuloma.  Normal in size.  Splenule anteriorly. Adrenals/Urinary Tract: No adrenal nodule. Exophytic 4.7 cm cyst from the anterior right kidney. Smaller cyst in the lower right kidney. No hydronephrosis. No perinephric edema. There is symmetric renal excretion on delayed phase imaging.  Minimally distended urinary bladder. Stomach/Bowel: Small to moderate hiatal hernia. Stomach is decompressed. There is no bowel obstruction or inflammation. Sigmoid diverticulosis without diverticulitis. Additional scattered diverticular seen in the more proximal colon. High-riding cecum in the right mid abdomen. Diminutive appendix tentatively visualized. No appendicitis. Vascular/Lymphatic: Aortic atherosclerosis and tortuosity. No aortic aneurysm. The portal, splenic, superior mesenteric veins are patent. Small peripancreatic nodes are not enlarged by size criteria. No enlarged lymph nodes in the abdomen or pelvis. Reproductive: Prostate is unremarkable. Other: Ascites. No free air or focal fluid collection. There is a fat containing umbilical hernia. Moderate to large fat containing left inguinal hernia, small fat containing right inguinal hernia. 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 Fusion of L1 through L3 vertebral bodies. Trabeculated lesion within T10 is likely a hemangioma. No acute osseous abnormalities are seen. IMPRESSION: 1. Biliary stent in place with air and scattered fluid in the stent. No biliary dilatation. 2. Known pancreatic head mass with margins difficult to delineate, measuring approximately 3.3 x 2.3 cm, previously 3.6 x 2.6 cm. 3. New liver lesion suspicious for metastasis, there is a subtle hypodensity in the right lobe at site of suspected metastasis on MRI. 4. Slight gallbladder wall enhancement and wall thickening, nonspecific in the setting. 5. Small right pleural effusion. 6. Colonic diverticulosis without diverticulitis. 7. Hiatal hernia. 8. Fat containing umbilical and bilateral inguinal hernias. 9. 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 Aortic Atherosclerosis (ICD10-I70.0). Electronically Signed   By: MKeith Rake M.D.   On: 08/04/2020 19:23   DG Chest Portable 1 View  Result Date: 08/04/2020 CLINICAL DATA:  Fatigue EXAM: PORTABLE CHEST 1 VIEW COMPARISON:  CT 07/21/2019 FINDINGS: Linear densities in the lung bases could reflect scarring or atelectasis. Heart is normal size. No effusions or acute bony abnormality. IMPRESSION: Bibasilar atelectasis or scarring Electronically Signed   By: KRolm BaptiseM.D.   On: 08/04/2020 16:55   UKoreaAbdomen Limited RUQ (LIVER/GB)  Result Date: 08/04/2020 CLINICAL DATA:  Right upper  quadrant pain EXAM: ULTRASOUND ABDOMEN LIMITED RIGHT UPPER QUADRANT COMPARISON:  CT earlier today FINDINGS: Gallbladder: Shadowing noted from the gallbladder fundus, likely due to gas within the gallbladder. Gallbladder wall is thickened at 7 mm. No visible stones. Common bile duct: Diameter: Metallic biliary stent noted. Unable to measure the common bile duct. Liver: Pneumobilia. No focal hepatic abnormality. Portal vein is patent on color Doppler imaging with normal direction of blood flow towards the liver. Other: None. IMPRESSION: Pneumobilia related to biliary stent. Gallbladder wall thickening.  No visible stones. Electronically Signed   By: Rolm Baptise M.D.   On: 08/04/2020 21:18      Assessment/Plan  Sepsis secondary to intra-abdominal infection -pt was febrile, tachycardia and has leukocytosis -Patient is status post biliary stent on 6/28 for obstructive jaundice.  Right upper ultrasound showing pneumobilia.  GI Dr. Michail Sermon was consulted by ED PA who recommended continue IV antibiotics and keeping n.p.o.  They will see in consultation in the morning. -Continue IV cefepime and Flagyl.  Blood cultures pending. -Has received over 2 L of IV fluid resuscitation in ED.  Continuous IV fluids at 150 cc/h -Continue to trend lactate  Elevated LFTs - Likely secondary to intrahepatic infection from biliary stent.  However we will still obtain acute hepatitis panel -Patient also likely has  metastatic disease to liver.  He initially had planned liver biopsy on 7/14 but this was aborted since lesions were not well seen on ultrasound.  Elevated total bilirubin -Chronically elevated and currently stable  Hx of pancreatic cancer with suspected mets to liver -follows with Dr. Benay Spice and has planned port-a-cath placement in Aug for systemic chemotherapy  Paroxysmal atrial fibrillation with RVR - has missed his home meds today. Give home metoprolol and diltiazem.  Also will give one-time dose of IV Lopressor - Continue Eliquis  Hypertension -hold on lisinopril-HCTZ for now while getting fluids     DVT prophylaxis:Eliquis Code Status: Full Family Communication: Plan discussed with patient and daughter at bedside  disposition Plan: Home with observation Consults called: GI Admission status: Observation  Level of care: Telemetry  Status is: Observation  The patient remains OBS appropriate and will d/c before 2 midnights.  Dispo: The patient is from: Home              Anticipated d/c is to: Home              Patient currently is not medically stable to d/c.   Difficult to place patient No         Orene Desanctis DO [facility]   If 7PM-7AM, please contact night-coverage [email or web address]   08/04/2020, 11:14 PM

## 2020-08-04 NOTE — Sepsis Progress Note (Signed)
Following per sepsis protocol   

## 2020-08-04 NOTE — ED Triage Notes (Signed)
Patient states that he had a stent  placed in his bile duct in June. Patient c/o chills, fever, emesis, and lethargic yesterday. Patient had a phone consult and wanted the patient to be seen for a possible blockage in the stent.

## 2020-08-04 NOTE — Progress Notes (Signed)
A consult was received from an ED physician for cefepime per pharmacy dosing.  The patient's profile has been reviewed for ht/wt/allergies/indication/available labs.    A one time order has been placed for cefepime 2gm IV x1.  Further antibiotics/pharmacy consults should be ordered by admitting physician if indicated.                       Thank you, Lynelle Doctor 08/04/2020  7:03 PM

## 2020-08-04 NOTE — Telephone Encounter (Signed)
Wife called back to report that daughter has assessed him and think he needs to be admitted. Daughter said she can transport him. Instructed them to take him to [facility] ER and be sure to tell them he is a cancer patient. Dr. Benay Spice notified.

## 2020-08-04 NOTE — Progress Notes (Signed)
Pharmacy Antibiotic Note  Barry Contreras. is a 87 y.o. male admitted on 08/04/2020 with fever, generalized weakness with nausea, pt with recent pancreatic cancer diagnosis. Pharmacy has been consulted for cefepime dosing for sepsis.  Plan: Cefepime 2gm IV q12h Follow renal function and clinical course  Height: '5\' 2"'$  (157.5 cm) Weight: 73.5 kg (162 lb) IBW/kg (Calculated) : 54.6  Temp (24hrs), Avg:98 F (36.7 C), Min:98 F (36.7 C), Max:98 F (36.7 C)  Recent Labs  Lab 08/04/20 1704 08/04/20 1826  WBC 17.2*  --   CREATININE 0.89  --   LATICACIDVEN 3.4* 2.5*    Estimated Creatinine Clearance: 52.4 mL/min (by C-G formula based on SCr of 0.89 mg/dL).    No Known Allergies   Thank you for allowing pharmacy to be a part of this patient's care.  Dolly Rias RPh 08/04/2020, 10:20 PM

## 2020-08-05 ENCOUNTER — Encounter (HOSPITAL_COMMUNITY): Payer: Self-pay | Admitting: Internal Medicine

## 2020-08-05 DIAGNOSIS — K8309 Other cholangitis: Secondary | ICD-10-CM | POA: Diagnosis present

## 2020-08-05 DIAGNOSIS — E871 Hypo-osmolality and hyponatremia: Secondary | ICD-10-CM | POA: Diagnosis present

## 2020-08-05 DIAGNOSIS — Z1611 Resistance to penicillins: Secondary | ICD-10-CM | POA: Diagnosis present

## 2020-08-05 DIAGNOSIS — R7881 Bacteremia: Secondary | ICD-10-CM | POA: Diagnosis not present

## 2020-08-05 DIAGNOSIS — I110 Hypertensive heart disease with heart failure: Secondary | ICD-10-CM | POA: Diagnosis present

## 2020-08-05 DIAGNOSIS — R0609 Other forms of dyspnea: Secondary | ICD-10-CM | POA: Diagnosis not present

## 2020-08-05 DIAGNOSIS — R509 Fever, unspecified: Secondary | ICD-10-CM | POA: Diagnosis present

## 2020-08-05 DIAGNOSIS — I4821 Permanent atrial fibrillation: Secondary | ICD-10-CM | POA: Diagnosis present

## 2020-08-05 DIAGNOSIS — I719 Aortic aneurysm of unspecified site, without rupture: Secondary | ICD-10-CM | POA: Diagnosis present

## 2020-08-05 DIAGNOSIS — K831 Obstruction of bile duct: Secondary | ICD-10-CM | POA: Diagnosis not present

## 2020-08-05 DIAGNOSIS — B370 Candidal stomatitis: Secondary | ICD-10-CM | POA: Diagnosis present

## 2020-08-05 DIAGNOSIS — E872 Acidosis: Secondary | ICD-10-CM | POA: Diagnosis present

## 2020-08-05 DIAGNOSIS — C787 Secondary malignant neoplasm of liver and intrahepatic bile duct: Secondary | ICD-10-CM | POA: Diagnosis present

## 2020-08-05 DIAGNOSIS — A4159 Other Gram-negative sepsis: Secondary | ICD-10-CM | POA: Diagnosis present

## 2020-08-05 DIAGNOSIS — D649 Anemia, unspecified: Secondary | ICD-10-CM | POA: Diagnosis present

## 2020-08-05 DIAGNOSIS — Z8673 Personal history of transient ischemic attack (TIA), and cerebral infarction without residual deficits: Secondary | ICD-10-CM | POA: Diagnosis not present

## 2020-08-05 DIAGNOSIS — I2510 Atherosclerotic heart disease of native coronary artery without angina pectoris: Secondary | ICD-10-CM | POA: Diagnosis present

## 2020-08-05 DIAGNOSIS — Z20822 Contact with and (suspected) exposure to covid-19: Secondary | ICD-10-CM | POA: Diagnosis present

## 2020-08-05 DIAGNOSIS — Y732 Prosthetic and other implants, materials and accessory gastroenterology and urology devices associated with adverse incidents: Secondary | ICD-10-CM | POA: Diagnosis present

## 2020-08-05 DIAGNOSIS — E876 Hypokalemia: Secondary | ICD-10-CM | POA: Diagnosis present

## 2020-08-05 DIAGNOSIS — T85898A Other specified complication of other internal prosthetic devices, implants and grafts, initial encounter: Secondary | ICD-10-CM | POA: Diagnosis present

## 2020-08-05 DIAGNOSIS — J9601 Acute respiratory failure with hypoxia: Secondary | ICD-10-CM | POA: Diagnosis present

## 2020-08-05 DIAGNOSIS — B961 Klebsiella pneumoniae [K. pneumoniae] as the cause of diseases classified elsewhere: Secondary | ICD-10-CM | POA: Diagnosis present

## 2020-08-05 DIAGNOSIS — I5031 Acute diastolic (congestive) heart failure: Secondary | ICD-10-CM | POA: Diagnosis present

## 2020-08-05 DIAGNOSIS — A419 Sepsis, unspecified organism: Secondary | ICD-10-CM | POA: Diagnosis not present

## 2020-08-05 DIAGNOSIS — I712 Thoracic aortic aneurysm, without rupture: Secondary | ICD-10-CM | POA: Diagnosis present

## 2020-08-05 DIAGNOSIS — R7989 Other specified abnormal findings of blood chemistry: Secondary | ICD-10-CM | POA: Diagnosis not present

## 2020-08-05 DIAGNOSIS — Z8249 Family history of ischemic heart disease and other diseases of the circulatory system: Secondary | ICD-10-CM | POA: Diagnosis not present

## 2020-08-05 DIAGNOSIS — B952 Enterococcus as the cause of diseases classified elsewhere: Secondary | ICD-10-CM | POA: Diagnosis present

## 2020-08-05 DIAGNOSIS — C250 Malignant neoplasm of head of pancreas: Secondary | ICD-10-CM | POA: Diagnosis present

## 2020-08-05 LAB — URINALYSIS, ROUTINE W REFLEX MICROSCOPIC
Bilirubin Urine: NEGATIVE
Glucose, UA: NEGATIVE mg/dL
Hgb urine dipstick: NEGATIVE
Ketones, ur: NEGATIVE mg/dL
Leukocytes,Ua: NEGATIVE
Nitrite: NEGATIVE
Protein, ur: 100 mg/dL — AB
Specific Gravity, Urine: 1.015 (ref 1.005–1.030)
pH: 5 (ref 5.0–8.0)

## 2020-08-05 LAB — BLOOD CULTURE ID PANEL (REFLEXED) - BCID2

## 2020-08-05 LAB — COMPREHENSIVE METABOLIC PANEL
ALT: 151 U/L — ABNORMAL HIGH (ref 0–44)
AST: 95 U/L — ABNORMAL HIGH (ref 15–41)
Albumin: 2.9 g/dL — ABNORMAL LOW (ref 3.5–5.0)
Alkaline Phosphatase: 203 U/L — ABNORMAL HIGH (ref 38–126)
Anion gap: 8 (ref 5–15)
BUN: 15 mg/dL (ref 8–23)
CO2: 21 mmol/L — ABNORMAL LOW (ref 22–32)
Calcium: 8 mg/dL — ABNORMAL LOW (ref 8.9–10.3)
Chloride: 107 mmol/L (ref 98–111)
Creatinine, Ser: 0.67 mg/dL (ref 0.61–1.24)
GFR, Estimated: >60 mL/min (ref >60)
Glucose, Bld: 148 mg/dL — ABNORMAL HIGH (ref 70–99)
Potassium: 3.5 mmol/L (ref 3.5–5.1)
Sodium: 136 mmol/L (ref 135–145)
Total Bilirubin: 2.3 mg/dL — ABNORMAL HIGH (ref 0.3–1.2)
Total Protein: 5.8 g/dL — ABNORMAL LOW (ref 6.5–8.1)

## 2020-08-05 LAB — HEPATITIS PANEL, ACUTE
HCV Ab: NONREACTIVE
Hep A IgM: NONREACTIVE
Hep B C IgM: NONREACTIVE
Hepatitis B Surface Ag: NONREACTIVE

## 2020-08-05 LAB — CBC
HCT: 30.9 % — ABNORMAL LOW (ref 39.0–52.0)
Hemoglobin: 10 g/dL — ABNORMAL LOW (ref 13.0–17.0)
MCH: 30.4 pg (ref 26.0–34.0)
MCHC: 32.4 g/dL (ref 30.0–36.0)
MCV: 93.9 fL (ref 80.0–100.0)
Platelets: 218 10*3/uL (ref 150–400)
RBC: 3.29 MIL/uL — ABNORMAL LOW (ref 4.22–5.81)
RDW: 14.2 % (ref 11.5–15.5)
WBC: 14.7 10*3/uL — ABNORMAL HIGH (ref 4.0–10.5)
nRBC: 0 % (ref 0.0–0.2)

## 2020-08-05 LAB — LACTIC ACID, PLASMA: Lactic Acid, Venous: 2.3 mmol/L (ref 0.5–1.9)

## 2020-08-05 MED ORDER — SODIUM CHLORIDE 0.9 % IV SOLN
2.0000 g | INTRAVENOUS | Status: DC
  Administered 2020-08-05 – 2020-08-06 (×2): 2 g via INTRAVENOUS
  Filled 2020-08-05: qty 20
  Filled 2020-08-05: qty 2
  Filled 2020-08-05: qty 20

## 2020-08-05 MED ORDER — IBUPROFEN 200 MG PO TABS
400.0000 mg | ORAL_TABLET | Freq: Once | ORAL | Status: AC
  Administered 2020-08-05: 400 mg via ORAL
  Filled 2020-08-05: qty 2

## 2020-08-05 MED ORDER — SODIUM CHLORIDE 0.9 % IV SOLN: INTRAVENOUS | Status: DC

## 2020-08-05 MED ORDER — METRONIDAZOLE 500 MG/100ML IV SOLN
500.0000 mg | Freq: Two times a day (BID) | INTRAVENOUS | Status: DC
  Administered 2020-08-05 – 2020-08-07 (×4): 500 mg via INTRAVENOUS
  Filled 2020-08-05 (×4): qty 100

## 2020-08-05 NOTE — ED Notes (Signed)
Hospitalist at bedside 

## 2020-08-05 NOTE — Consult Note (Addendum)
Referring Provider: Dr. [name] Primary Care Physician:  Lajean Manes, MD Primary Gastroenterologist:  Dr. Watt Climes  Reason for Consultation:  Obstructive Jaundice  HPI: Barry Contreras. is a 87 y.o. male with metastatic pancreatic cancer s/p biliary metal stent placed during ERCP 07/04/20 by Dr. Watt Climes. Developed a fever of 100 yesterday (at home) T 103.1 here with chills and one episode of vomiting. Denies abdominal pain, diarrhea, chest pain, or shortness of breath. On Eliquis for Afib. TB 2.4, ALP 263, AST 192, ALT 220, WBC 17.2 yesterday and today ALP 203, AST 95, ALT 151. Son in room.  Past Medical History:  Diagnosis Date   Cancer [facility])    Diverticulitis    Family history of breast cancer 07/27/2020   Family history of colon cancer 07/27/2020   Hyperglycemia    Hypertension    Inguinal hernia    Macular degeneration    Mitral regurgitation    Umbilical hernia     Past Surgical History:  Procedure Laterality Date   BILIARY STENT PLACEMENT N/A 07/04/2020   Procedure: BILIARY STENT PLACEMENT;  Surgeon: Clarene Essex, MD;  Location: [facility] ENDOSCOPY;  Service: Endoscopy;  Laterality: N/A;   CATARACT EXTRACTION     ERCP N/A 07/04/2020   Procedure: ENDOSCOPIC RETROGRADE CHOLANGIOPANCREATOGRAPHY (ERCP);  Surgeon: Clarene Essex, MD;  Location: [facility];  Service: Endoscopy;  Laterality: N/A;   EUS N/A 07/04/2020   Procedure: UPPER ENDOSCOPIC ULTRASOUND (EUS) RADIAL;  Surgeon: Clarene Essex, MD;  Location: [facility];  Service: Endoscopy;  Laterality: N/A;   FINE NEEDLE ASPIRATION  07/04/2020   Procedure: FINE NEEDLE ASPIRATION (FNA) LINEAR;  Surgeon: Clarene Essex, MD;  Location: [facility];  Service: Endoscopy;;   IRRIGATION AND DEBRIDEMENT SEBACEOUS CYST     SPHINCTEROTOMY  07/04/2020   Procedure: Joan Mayans;  Surgeon: Clarene Essex, MD;  Location: [facility];  Service: Endoscopy;;    Prior to Admission medications   Medication Sig Start Date End Date Taking? Authorizing Provider   acetaminophen (TYLENOL) 650 MG CR tablet Take 1,300 mg by mouth every 8 (eight) hours as needed for pain.   Yes [provider]  apixaban (ELIQUIS) 5 MG TABS tablet Take 5 mg by mouth 2 (two) times daily.   Yes [provider]  Apoaequorin (PREVAGEN PO) Take 1 tablet by mouth daily.   Yes [provider]  azelastine (ASTELIN) 0.1 % nasal spray Place 1 spray into both nostrils daily as needed for rhinitis (To keep ears open).   Yes [provider]  benazepril-hydrochlorthiazide (LOTENSIN HCT) 20-12.5 MG per tablet Take 1 tablet by mouth daily.     Yes [provider]  CARDIZEM LA 120 MG 24 hr tablet Take 120 mg by mouth daily.  06/17/19  Yes [provider]  dorzolamide-timolol (COSOPT) 22.3-6.8 MG/ML ophthalmic solution Place 2 drops into both eyes 2 (two) times daily.   Yes [provider]  ketotifen (ZADITOR) 0.025 % ophthalmic solution Place 1 drop into both eyes daily as needed (Clear and dry eyes).   Yes [provider]  Lysine 1000 MG TABS Take 1,000 mg by mouth daily.   Yes [provider]  metoprolol (LOPRESSOR) 50 MG tablet Take 50 mg by mouth 2 (two) times daily.     Yes [provider]  Multiple Vitamin (MULTIVITAMIN) tablet Take 1 tablet by mouth daily.     Yes [provider]  Multiple Vitamins-Minerals (VISION-VITE PRESERVE PO) Take 1 tablet by mouth 2 (two) times daily.   Yes [provider]  Saw Palmetto 450 MG CAPS Take 450 mg by mouth 2 (two) times daily.   Yes [provider]  traMADol (ULTRAM) 50 MG tablet Take 0.5-1 tablets (25-50 mg total) by mouth every 6 (six) hours as needed. Patient taking differently: Take 50 mg by mouth every 6 (six) hours as needed for severe pain. 07/24/20  Yes [name], MD  lidocaine-prilocaine (EMLA) cream Apply 1 application topically as directed. Apply 1/2 tablespoon to port 2 hours prior to stick and cover with plastic wrap to  numb site 07/26/20   [name], MD  ondansetron (ZOFRAN) 8 MG tablet Take 1 tablet (8 mg total) by mouth every 8 (eight) hours as needed for nausea or vomiting. 07/26/20   [name], MD  prochlorperazine (COMPAZINE) 10 MG tablet Take 1 tablet (10 mg total) by mouth every 6 (six) hours as needed for nausea. 07/26/20   [name], MD    Scheduled Meds:  apixaban  5 mg Oral BID   diltiazem  120 mg Oral Daily   metoprolol tartrate  50 mg Oral BID   Continuous Infusions:  sodium chloride 150 mL/hr at 08/05/20 0820   ceFEPime (MAXIPIME) IV Stopped (08/05/20 1120)   metronidazole 500 mg (08/05/20 1127)   PRN Meds:.acetaminophen, ondansetron (ZOFRAN) IV, traMADol  Allergies as of 08/04/2020   (No Known Allergies)    Family History  Problem Relation Age of Onset   Hypertension Mother    Stroke Mother    Heart attack Mother    Hypertension Father    Alzheimer's disease Father    Pneumonia Father    Stroke Sister    Alzheimer's disease Sister    Congenital heart disease Brother    Breast cancer Paternal Aunt        dx after 64   Lymphoma Paternal Grandfather    Breast cancer Niece        dx 55s   Colon cancer Nephew        d. 102s    Social History   Socioeconomic History   Marital status: Married    Spouse name: Not on file   Number of children: Not on file   Years of education: Not on file   Highest education level: Not on file  Occupational History   Not on file  Tobacco Use   Smoking status: Former    Years: 10.00    Types: Cigarettes    Quit date: 01/08/1968    Years since quitting: 52.6   Smokeless tobacco: Never  Vaping Use   Vaping Use: Never used  Substance and Sexual Activity   Alcohol use: Yes    Comment: rarely   Drug use: No   Sexual activity: Not on file  Other Topics Concern   Not on file  Social History Narrative   Not on file   Social Determinants of Health   Financial Resource Strain: Not on file  Food Insecurity: Not on  file  Transportation Needs: Not on file  Physical Activity: Not on file  Stress: Not on file  Social Connections: Not on file  Intimate Partner Violence: Not on file    Review of Systems: All negative except as stated above in HPI.  Physical Exam: Vital signs: Vitals:   08/05/20 1100 08/05/20 1200  BP: 133/74 125/87  Pulse: 90 90  Resp: (!) 21 19  Temp:    SpO2: 97% 97%  T 98.2, Tm 103.1   General:  Lethargic, elderly, well-nourished, no acute distress, pleasant  Head: normocephalic, atraumatic Eyes: anicteric sclera ENT: oropharynx clear Neck: supple, nontender Lungs:  Clear throughout to auscultation.   No wheezes, crackles, or rhonchi. No acute distress. Heart:  Regular rate and rhythm; no murmurs, clicks, rubs,  or gallops. Abdomen: upper quadrant tenderness with guarding, soft, nondistended, +BS  Rectal:  Deferred Ext: no edema  GI:  Lab Results: Recent Labs    08/04/20 1704 08/05/20 0429  WBC 17.2* 14.7*  HGB 12.6* 10.0*  HCT 39.5 30.9*  PLT 297 218   BMET Recent Labs    08/04/20 1704 08/05/20 0429  NA 136 136  K 4.0 3.5  CL 100 107  CO2 24 21*  GLUCOSE 142* 148*  BUN 15 15  CREATININE 0.89 0.67  CALCIUM 9.6 8.0*   LFT Recent Labs    08/05/20 0429  PROT 5.8*  ALBUMIN 2.9*  AST 95*  ALT 151*  ALKPHOS 203*  BILITOT 2.3*   PT/INR Recent Labs    08/04/20 1858  LABPROT 16.7*  INR 1.4*     Studies/Results: CT ABDOMEN PELVIS W CONTRAST  Result Date: 08/04/2020 CLINICAL DATA:  Provided history of: Cholecystitis (Ped 0-18y) possible biliary stent obstruction Technologist notes state recent diagnosis of pancreatic cancer with biliary stent placed last month. Abdominal pain, nausea, vomiting, and lethargy. EXAM: CT ABDOMEN AND PELVIS WITH CONTRAST TECHNIQUE: Multidetector CT imaging of the abdomen and pelvis was performed using the standard protocol following bolus administration of intravenous contrast. CONTRAST:  50m OMNIPAQUE IOHEXOL 350  MG/ML SOLN COMPARISON:  Abdominopelvic CT 05/29/2020, MRI 06/17/2020 FINDINGS: Lower chest: Small right pleural effusion. Bibasilar atelectasis. No basilar pulmonary nodule. Small hiatal hernia. Hepatobiliary: New ill-defined hypodensity in the periphery of the right lobe, series 2, image 14. Subtle low-density in the inferior right lobe series 2, image 33, at site of hypoenhancing lesion on MRI. Stable small subcapsular cyst in the right lobe, series 2, image 33. Biliary stent is in place with air and scattered fluid in the stent. No bile duct dilatation surround the stent. Resultant pneumobilia. There is no intrahepatic biliary ductal dilatation. Gallbladder is physiologically distended, there may be minimal gallbladder wall enhancement and wall thickening. No calcified gallstone. Pancreas: Ill-defined low-density in the pancreatic head with margins difficult to delineate, measuring approximately 3.3 x 2.3 cm, previously 3.6 x 2.6 cm. Mild atrophy of the distal pancreas. No ductal dilatation. No peripancreatic fat stranding. Spleen: Punctate granuloma.  Normal in size.  Splenule anteriorly. Adrenals/Urinary Tract: No adrenal nodule. Exophytic 4.7 cm cyst from the anterior right kidney. Smaller cyst in the lower right kidney. No hydronephrosis. No perinephric edema. There is symmetric renal excretion on delayed phase imaging. Minimally distended urinary bladder. Stomach/Bowel: Small to moderate hiatal hernia. Stomach is decompressed. There is no bowel obstruction or inflammation. Sigmoid diverticulosis without diverticulitis. Additional scattered diverticular seen in the more proximal colon. High-riding cecum in the right mid abdomen. Diminutive appendix tentatively visualized. No appendicitis. Vascular/Lymphatic: Aortic atherosclerosis and tortuosity. No aortic aneurysm. The portal, splenic, superior mesenteric veins are patent. Small peripancreatic nodes are not enlarged by size criteria. No enlarged lymph nodes  in the abdomen or pelvis. Reproductive: Prostate is unremarkable. Other: Ascites. No free air or focal fluid collection. There is a fat containing umbilical hernia. Moderate to large fat containing left inguinal hernia, small fat containing right inguinal hernia. 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 Fusion of L1 through L3 vertebral bodies.  Trabeculated lesion within T10 is likely a hemangioma. No acute osseous abnormalities are seen. IMPRESSION: 1. Biliary stent in place with air and scattered fluid in the stent. No biliary dilatation. 2. Known pancreatic head mass with margins difficult to delineate, measuring approximately 3.3 x 2.3 cm, previously 3.6 x 2.6 cm. 3. New liver lesion suspicious for metastasis, there is a subtle hypodensity in the right lobe at site of suspected metastasis on MRI. 4. Slight gallbladder wall enhancement and wall thickening, nonspecific in the setting. 5. Small right pleural effusion. 6. Colonic diverticulosis without diverticulitis. 7. Hiatal hernia. 8. Fat containing umbilical and bilateral inguinal hernias. 9. 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 Aortic Atherosclerosis (ICD10-I70.0). Electronically Signed   By: Keith Rake M.D.   On: 08/04/2020 19:23   DG Chest Portable 1 View  Result Date: 08/04/2020 CLINICAL DATA:  Fatigue EXAM: PORTABLE CHEST 1 VIEW COMPARISON:  CT 07/21/2019 FINDINGS: Linear densities in the lung bases could reflect scarring or atelectasis. Heart is normal size. No effusions or acute bony abnormality. IMPRESSION: Bibasilar atelectasis or scarring Electronically Signed   By: Rolm Baptise M.D.   On: 08/04/2020 16:55   US Abdomen Limited RUQ (LIVER/GB)  Result Date: 08/04/2020 CLINICAL DATA:  Right upper quadrant pain EXAM: ULTRASOUND ABDOMEN LIMITED RIGHT UPPER QUADRANT COMPARISON:  CT  earlier today FINDINGS: Gallbladder: Shadowing noted from the gallbladder fundus, likely due to gas within the gallbladder. Gallbladder wall is thickened at 7 mm. No visible stones. Common bile duct: Diameter: Metallic biliary stent noted. Unable to measure the common bile duct. Liver: Pneumobilia. No focal hepatic abnormality. Portal vein is patent on color Doppler imaging with normal direction of blood flow towards the liver. Other: None. IMPRESSION: Pneumobilia related to biliary stent. Gallbladder wall thickening.  No visible stones. Electronically Signed   By: Rolm Baptise M.D.   On: 08/04/2020 21:18    Impression/Plan: Metastatic pancreatic cancer s/p biliary stent placement last month presenting with F/C and increase in LFTs. Gram negative bacteremia. I think the metal biliary stent remains patent and do not think repeat ERCP and additional stent placement is needed at this time. LFTs are mildly elevated and improving. Agree with broad spectrum antibiotics. Clear liquid diet. Supportive care. Port placement reportedly this week and defer to oncology whether to proceed with that with his bacteremia but likely will need that postponed. Will follow.    LOS: 0 days   [name]  08/05/2020, 12:38 PM  Questions please call [phone]

## 2020-08-05 NOTE — Progress Notes (Signed)
PHARMACY - PHYSICIAN COMMUNICATION CRITICAL VALUE ALERT - BLOOD CULTURE IDENTIFICATION (BCID)  Barry Contreras. is an 87 y.o. male who presented to [facility] on 08/04/2020 with a chief complaint of fever, N/V. CT abdomen showing pneumobilia with slight gallbladder wall enhancement and wall thickening.  Dedicated right upper quadrant ultrasound showed gallbladder wall thickening with no visible stones.  Assessment:  Suspected intra-abdominal source.  BCx:  3/4 bottles positive for GNR.  BCID: Klebsiella pneumoniae  Name of physician (or Provider) Contacted: Dr [name]  Current antibiotics: Cefepime, metronidazole  Changes to prescribed antibiotics recommended:  Recommendations accepted by provider Change Cefepime to Ceftriaxone 2g IV Q24h Continue Metronidazole '500mg'$  IV Q12h  Results for orders placed or performed during the hospital encounter of 08/04/20  Blood Culture ID Panel (Reflexed) (Collected: 08/04/2020  7:25 PM)  Result Value Ref Range   Enterococcus faecalis NOT DETECTED NOT DETECTED   Enterococcus Faecium NOT DETECTED NOT DETECTED   Listeria monocytogenes NOT DETECTED NOT DETECTED   Staphylococcus species NOT DETECTED NOT DETECTED   Staphylococcus aureus (BCID) NOT DETECTED NOT DETECTED   Staphylococcus epidermidis NOT DETECTED NOT DETECTED   Staphylococcus lugdunensis NOT DETECTED NOT DETECTED   Streptococcus species NOT DETECTED NOT DETECTED   Streptococcus agalactiae NOT DETECTED NOT DETECTED   Streptococcus pneumoniae NOT DETECTED NOT DETECTED   Streptococcus pyogenes NOT DETECTED NOT DETECTED   A.calcoaceticus-baumannii NOT DETECTED NOT DETECTED   Bacteroides fragilis NOT DETECTED NOT DETECTED   Enterobacterales DETECTED (A) NOT DETECTED   Enterobacter cloacae complex NOT DETECTED NOT DETECTED   Escherichia coli NOT DETECTED NOT DETECTED   Klebsiella aerogenes NOT DETECTED NOT DETECTED   Klebsiella oxytoca NOT DETECTED NOT DETECTED   Klebsiella pneumoniae  DETECTED (A) NOT DETECTED   Proteus species NOT DETECTED NOT DETECTED   Salmonella species NOT DETECTED NOT DETECTED   Serratia marcescens NOT DETECTED NOT DETECTED   Haemophilus influenzae NOT DETECTED NOT DETECTED   Neisseria meningitidis NOT DETECTED NOT DETECTED   Pseudomonas aeruginosa NOT DETECTED NOT DETECTED   Stenotrophomonas maltophilia NOT DETECTED NOT DETECTED   Candida albicans NOT DETECTED NOT DETECTED   Candida auris NOT DETECTED NOT DETECTED   Candida glabrata NOT DETECTED NOT DETECTED   Candida krusei NOT DETECTED NOT DETECTED   Candida parapsilosis NOT DETECTED NOT DETECTED   Candida tropicalis NOT DETECTED NOT DETECTED   Cryptococcus neoformans/gattii NOT DETECTED NOT DETECTED   CTX-M ESBL NOT DETECTED NOT DETECTED   Carbapenem resistance IMP NOT DETECTED NOT DETECTED   Carbapenem resistance KPC NOT DETECTED NOT DETECTED   Carbapenem resistance NDM NOT DETECTED NOT DETECTED   Carbapenem resist OXA 48 LIKE NOT DETECTED NOT DETECTED   Carbapenem resistance VIM NOT DETECTED NOT DETECTED    Gretta Arab PharmD, BCPS Clinical Pharmacist [facility] main pharmacy [phone] 08/05/2020 12:49 PM

## 2020-08-05 NOTE — Progress Notes (Signed)
PROGRESS NOTE    Barry Contreras.  ZDG:[id number] DOB: Jun 14, 1931 DOA: 08/04/2020 PCP: Lajean Manes, MD   Brief Narrative:  The patient is an 87 year old Caucasian male with a past medical history significant for but not limited to pancreatic cancer with recent systemic chemotherapy, history of obstructive jaundice status post biliary stent placement, proximal atrial fibrillation on anticoagulant with Eliquis, hypertension, prediabetes as well as other comorbidities who presented with a chief complaint of fever, nausea, vomiting.  Patient daughter was at bedside when he came in and he was having chills and rigors.  Daughter just come into town to visit and states that yesterday began to develop fevers or chills.  The patient had 1 episode of nausea vomiting but no diarrhea.  No chest pain.  He has a history of obstructive jaundice and is status post biliary stent placement with [facility] in 07/04/2020.  In the ED is found to be tachycardic and tachypneic with a lactic acid of 3.4 and abnormal LFTs.  He underwent a CT of the abdomen and pelvis which showed pneumobilia with slight gallbladder wall enhancement and wall thickening.  A dedicated right upper quadrant ultrasound showed gallbladder wall thickening with no visible stone.  The ED discussed with Dr. Michail Sermon who recommended the patient to be kept n.p.o. and continue antibiotics.  General surgery was also consulted and recommended the GI evaluation and he started on IV cefepime and Flagyl and this has now been de-escalated to IV ceftriaxone given that further work-up is revealed that he got a Klebsiella bacteremia.  Assessment & Plan:   Principal Problem:   Sepsis ([place]) Active Problems:   Hypertension   Paroxysmal atrial fibrillation (HCC)   Cancer of head of pancreas (HCC)   Elevated LFTs   Serum total bilirubin elevated   Bacteremia  Sepsis secondary to intra-abdominal infection and Klebsiella bacteremia, present admission -Patient met  sepsis criteria as he is febrile, tachycardic and had a leukocytosis -He is status post biliary stent placement on 06/26/2020 for obstructive jaundice and right upper quadrant ultrasound showed pneumobilia -GI consulted and recommending continue IV antibiotics keeping the patient n.p.o. -He started on IV cefepime and Flagyl but this has been de-escalated to IV ceftriaxone given that he is a Klebsiella pneumonia -He received 2 L of IV fluid in the ED and he is currently getting IV fluids at 150 cc/h -Lactic acid level was elevated and trending down at 2.3; on presentation was 5.3 -WBC went from 17.2 is now 14.7 -GI recommending discontinuing antibiotics and supportive care -He is now on a clear liquid diet  Abnormal LFTs/elevated LFTs -In the setting of intrahepatic infection from biliary stent -Patient has CT of the abdomen pelvis as well as right upper quadrant ultrasound -AST went from 192 is now trended down to 95 -ALT went from 220s now trended down to 151 -He is going to be getting a acute hepatitis panel -He also has metastatic liver disease and initially planned to have a liver biopsy on 07/20/2020 but this was aborted suspicious for not well seen on ultrasound  -Has been consulted in the feel that the metal biliary stent remains patent and they do not think that repeat ERCP and additional stent placement is needed at this time  Hyperbilirubinemia -Has been chronically elevated in the setting of his obstructive jaundice and is now gone from 2.4 now 2.3 -Continue monitor and trend and repeat CMP in a.m.  History of pancreatic cancer suspected mets to liver -Recently underwent chemotherapy last  week -Follows with Dr. Benay Spice who will be consulted and added to the care teams -Patient has a Port-A-Cath placement for systemic chemotherapy scheduled for August  Proximal Atrial Fibrillation -Currently patient is an A. Fib but is rate controlled -Anticoagulation with apixaban -Continue  with diltiazem 120 once p.o. daily -Continue telemetry monitoring  Normocytic Anemia  -Likely dilutional drop -Patient's hemoglobin/hematocrit on admission we will 12.6/39.5 and trended down to 10.0/30.9 -Check anemia panel in a.m.-continue monitor for signs or symptoms of bleeding; currently no overt bleeding noted -Repeat CBC in a.m.  DVT prophylaxis: Anticoagulated with apixaban Code Status: FULL CODE Family Communication: No family present at bedside Disposition Plan: Pending further clinical improvement and clearance by the specialist Status is: Inpatient  Remains inpatient appropriate because:IV treatments appropriate due to intensity of illness or inability to take PO and Inpatient level of care appropriate due to severity of illness  Dispo: The patient is from: Home              Anticipated d/c is to:  TBD              Patient currently is not medically stable to d/c.   Difficult to place patient No  Consultants:  Gastroenterology   Procedures: None  Antimicrobials:  Anti-infectives (From admission, onward)    Start     Dose/Rate Route Frequency Ordered Stop   08/05/20 2200  metroNIDAZOLE (FLAGYL) IVPB 500 mg        500 mg 100 mL/hr over 60 Minutes Intravenous Every 12 hours 08/05/20 1347     08/05/20 2000  cefTRIAXone (ROCEPHIN) 2 g in sodium chloride 0.9 % 100 mL IVPB        2 g 200 mL/hr over 30 Minutes Intravenous Every 24 hours 08/05/20 1347     08/05/20 0800  ceFEPIme (MAXIPIME) 2 g in sodium chloride 0.9 % 100 mL IVPB  Status:  Discontinued        2 g 200 mL/hr over 30 Minutes Intravenous Every 12 hours 08/04/20 2218 08/05/20 1347   08/04/20 2200  metroNIDAZOLE (FLAGYL) IVPB 500 mg  Status:  Discontinued        500 mg 100 mL/hr over 60 Minutes Intravenous Every 8 hours 08/04/20 2158 08/05/20 1347   08/04/20 1900  ceFEPIme (MAXIPIME) 2 g in sodium chloride 0.9 % 100 mL IVPB        2 g 200 mL/hr over 30 Minutes Intravenous  Once 08/04/20 1858 08/04/20 2046    08/04/20 1900  metroNIDAZOLE (FLAGYL) IVPB 500 mg        500 mg 100 mL/hr over 60 Minutes Intravenous  Once 08/04/20 1858 08/04/20 2046        Subjective: Seen and examined at bedside and states that he is doing okay.  Still had some mild abdominal pain and his abdomen is palpated.  No chest pain or shortness of breath.  Fevers improving.  Denies any lightheadedness or dizziness.  No other concerns or complaints this time.  Objective: Vitals:   08/05/20 1300 08/05/20 1400 08/05/20 1418 08/05/20 1440  BP: 127/85 (!) 144/87  (!) 141/83  Pulse: 92 84  89  Resp: (!) 22 (!) 24  18  Temp:   98 F (36.7 C)   TempSrc:   Oral   SpO2: 96% 94%  97%  Weight:      Height:        Intake/Output Summary (Last 24 hours) at 08/05/2020 1828 Last data filed at 08/05/2020 0533 Gross per 24  hour  Intake 2349.94 ml  Output --  Net 2349.94 ml   Filed Weights   08/04/20 1608  Weight: 73.5 kg   Examination: Physical Exam:  Constitutional: WN/WD overweight elderly Caucasian male currently in no acute distress appears calm but slightly uncomfortable  Eyes: Lids and conjunctivae normal, sclerae anicteric  ENMT: External Ears, Nose appear normal.  He is a little hard of hearing. Neck: Appears normal, supple, no cervical masses, normal ROM, no appreciable thyromegaly; no JVD Respiratory: Diminished to auscultation bilaterally, no wheezing, rales, rhonchi or crackles. Normal respiratory effort and patient is not tachypenic. No accessory muscle use.  Cardiovascular: Irregularly irregular, no murmurs / rubs / gallops. S1 and S2 auscultated.  Minimal extremity edema Abdomen: Soft, tender to palpate in the upper abdomen, slightly distended secondary body habitus. Bowel sounds positive.  GU: Deferred. Musculoskeletal: No clubbing / cyanosis of digits/nails. No joint deformity upper and lower extremities.  Skin: No rashes, lesions, ulcers on limited skin evaluation. No induration; Warm and dry.  Neurologic:  CN 2-12 grossly intact with no focal deficits. Romberg sign cerebellar reflexes not assessed.  Psychiatric: Normal judgment and insight. Alert and oriented x 3. Normal mood and appropriate affect.   Data Reviewed: I have personally reviewed following labs and imaging studies  CBC: Recent Labs  Lab 08/04/20 1704 08/05/20 0429  WBC 17.2* 14.7*  NEUTROABS 15.0*  --   HGB 12.6* 10.0*  HCT 39.5 30.9*  MCV 94.3 93.9  PLT 297 789   Basic Metabolic Panel: Recent Labs  Lab 08/04/20 1704 08/05/20 0429  NA 136 136  K 4.0 3.5  CL 100 107  CO2 24 21*  GLUCOSE 142* 148*  BUN 15 15  CREATININE 0.89 0.67  CALCIUM 9.6 8.0*   GFR: Estimated Creatinine Clearance: 58.3 mL/min (by C-G formula based on SCr of 0.67 mg/dL). Liver Function Tests: Recent Labs  Lab 08/04/20 1704 08/05/20 0429  AST 192* 95*  ALT 220* 151*  ALKPHOS 263* 203*  BILITOT 2.4* 2.3*  PROT 7.1 5.8*  ALBUMIN 3.7 2.9*   Recent Labs  Lab 08/04/20 1704  LIPASE 36   No results for input(s): AMMONIA in the last 168 hours. Coagulation Profile: Recent Labs  Lab 08/04/20 1858  INR 1.4*   Cardiac Enzymes: No results for input(s): CKTOTAL, CKMB, CKMBINDEX, TROPONINI in the last 168 hours. BNP (last 3 results) No results for input(s): PROBNP in the last 8760 hours. HbA1C: No results for input(s): HGBA1C in the last 72 hours. CBG: No results for input(s): GLUCAP in the last 168 hours. Lipid Profile: No results for input(s): CHOL, HDL, LDLCALC, TRIG, CHOLHDL, LDLDIRECT in the last 72 hours. Thyroid Function Tests: No results for input(s): TSH, T4TOTAL, FREET4, T3FREE, THYROIDAB in the last 72 hours. Anemia Panel: No results for input(s): VITAMINB12, FOLATE, FERRITIN, TIBC, IRON, RETICCTPCT in the last 72 hours. Sepsis Labs: Recent Labs  Lab 08/04/20 1704 08/04/20 1826 08/04/20 2200 08/05/20 0100  LATICACIDVEN 3.4* 2.5* 5.3* 2.3*    Recent Results (from the past 240 hour(s))  Blood Culture (routine x  2)     Status: None (Preliminary result)   Collection Time: 08/04/20  7:18 PM   Specimen: BLOOD  Result Value Ref Range Status   Specimen Description   Final    BLOOD RIGHT ANTECUBITAL Performed at [facility], [place] [address] [place], [place] [address]    Special Requests   Final    BOTTLES DRAWN AEROBIC AND ANAEROBIC Blood Culture adequate volume Performed  at [facility], [place] [address] [place], [place] [address]    Culture  Setup Time   Final    GRAM NEGATIVE RODS IN BOTH AEROBIC AND ANAEROBIC BOTTLES CRITICAL VALUE NOTED.  VALUE IS CONSISTENT WITH PREVIOUSLY REPORTED AND CALLED VALUE. Performed at [facility], [place] [address] [place], [place] [address]    Culture GRAM NEGATIVE RODS  Final   Report Status PENDING  Incomplete  Blood Culture (routine x 2)     Status: None (Preliminary result)   Collection Time: 08/04/20  7:25 PM   Specimen: BLOOD  Result Value Ref Range Status   Specimen Description   Final    BLOOD WRIST LEFT Performed at [place] [address], [place], [place] [address]    Special Requests   Final    BOTTLES DRAWN AEROBIC AND ANAEROBIC Blood Culture adequate volume Performed at [place] [address], [place], [place] [address]    Culture  Setup Time   Final    GRAM NEGATIVE RODS IN BOTH AEROBIC AND ANAEROBIC BOTTLES CRITICAL RESULT CALLED TO, READ BACK BY AND VERIFIED WITH: CHRISTINE SHADE PHARMD _0  08/05/20 EB Performed at [facility], [place] [address], [place], [place] [address]    Culture GRAM NEGATIVE RODS  Final   Report Status PENDING  Incomplete  Blood Culture ID Panel (Reflexed)     Status: Abnormal   Collection Time: 08/04/20  7:25 PM  Result Value Ref Range Status   Enterococcus faecalis NOT DETECTED NOT DETECTED Final   Enterococcus Faecium NOT DETECTED NOT DETECTED Final   Listeria monocytogenes NOT DETECTED NOT DETECTED Final    Staphylococcus species NOT DETECTED NOT DETECTED Final   Staphylococcus aureus (BCID) NOT DETECTED NOT DETECTED Final   Staphylococcus epidermidis NOT DETECTED NOT DETECTED Final   Staphylococcus lugdunensis NOT DETECTED NOT DETECTED Final   Streptococcus species NOT DETECTED NOT DETECTED Final   Streptococcus agalactiae NOT DETECTED NOT DETECTED Final   Streptococcus pneumoniae NOT DETECTED NOT DETECTED Final   Streptococcus pyogenes NOT DETECTED NOT DETECTED Final   A.calcoaceticus-baumannii NOT DETECTED NOT DETECTED Final   Bacteroides fragilis NOT DETECTED NOT DETECTED Final   Enterobacterales DETECTED (A) NOT DETECTED Final    Comment: Enterobacterales represent a large order of gram negative bacteria, not a single organism. CRITICAL RESULT CALLED TO, READ BACK BY AND VERIFIED WITH: CHRISTINE SHADE PHARMD _1  08/05/20 EB    Enterobacter cloacae complex NOT DETECTED NOT DETECTED Final   Escherichia coli NOT DETECTED NOT DETECTED Final   Klebsiella aerogenes NOT DETECTED NOT DETECTED Final   Klebsiella oxytoca NOT DETECTED NOT DETECTED Final   Klebsiella pneumoniae DETECTED (A) NOT DETECTED Final    Comment: CRITICAL RESULT CALLED TO, READ BACK BY AND VERIFIED WITH: CHRISTINE SHADE PHARMD _2  08/05/20 EB    Proteus species NOT DETECTED NOT DETECTED Final   Salmonella species NOT DETECTED NOT DETECTED Final   Serratia marcescens NOT DETECTED NOT DETECTED Final   Haemophilus influenzae NOT DETECTED NOT DETECTED Final   Neisseria meningitidis NOT DETECTED NOT DETECTED Final   Pseudomonas aeruginosa NOT DETECTED NOT DETECTED Final   Stenotrophomonas maltophilia NOT DETECTED NOT DETECTED Final   Candida albicans NOT DETECTED NOT DETECTED Final   Candida auris NOT DETECTED NOT DETECTED Final   Candida glabrata NOT DETECTED NOT DETECTED Final   Candida krusei NOT DETECTED NOT DETECTED Final   Candida parapsilosis NOT DETECTED NOT DETECTED Final   Candida tropicalis NOT  DETECTED NOT  DETECTED Final   Cryptococcus neoformans/gattii NOT DETECTED NOT DETECTED Final   CTX-M ESBL NOT DETECTED NOT DETECTED Final   Carbapenem resistance IMP NOT DETECTED NOT DETECTED Final   Carbapenem resistance KPC NOT DETECTED NOT DETECTED Final   Carbapenem resistance NDM NOT DETECTED NOT DETECTED Final   Carbapenem resist OXA 48 LIKE NOT DETECTED NOT DETECTED Final   Carbapenem resistance VIM NOT DETECTED NOT DETECTED Final    Comment: Performed at [facility], [place] [address], [place], [place] [address]  Resp Panel by RT-PCR (Flu A&B, Covid) Nasopharyngeal Swab     Status: None   Collection Time: 08/04/20  7:45 PM   Specimen: Nasopharyngeal Swab; Nasopharyngeal(NP) swabs in vial transport medium  Result Value Ref Range Status   SARS Coronavirus 2 by RT PCR NEGATIVE NEGATIVE Final    Comment: (NOTE) SARS-CoV-2 target nucleic acids are NOT DETECTED.  The SARS-CoV-2 RNA is generally detectable in upper respiratory specimens during the acute phase of infection. The lowest concentration of SARS-CoV-2 viral copies this assay can detect is 138 copies/mL. A negative result does not preclude SARS-Cov-2 infection and should not be used as the sole basis for treatment or other patient management decisions. A negative result may occur with  improper specimen collection/handling, submission of specimen other than nasopharyngeal swab, presence of viral mutation(s) within the areas targeted by this assay, and inadequate number of viral copies(<138 copies/mL). A negative result must be combined with clinical observations, patient history, and epidemiological information. The expected result is Negative.  Fact Sheet for Patients:  EntrepreneurPulse.com.au  Fact Sheet for Healthcare Providers:  IncredibleEmployment.be  This test is no t yet approved or cleared by the [place] FDA and  has been authorized for detection and/or diagnosis of SARS-CoV-2  by FDA under an Emergency Use Authorization (EUA). This EUA will remain  in effect (meaning this test can be used) for the duration of the COVID-19 declaration under Section 564(b)(1) of the Act, 21 U.S.C.section 360bbb-3(b)(1), unless the authorization is terminated  or revoked sooner.       Influenza A by PCR NEGATIVE NEGATIVE Final   Influenza B by PCR NEGATIVE NEGATIVE Final    Comment: (NOTE) The Xpert Xpress SARS-CoV-2/FLU/RSV plus assay is intended as an aid in the diagnosis of influenza from Nasopharyngeal swab specimens and should not be used as a sole basis for treatment. Nasal washings and aspirates are unacceptable for Xpert Xpress SARS-CoV-2/FLU/RSV testing.  Fact Sheet for Patients: EntrepreneurPulse.com.au  Fact Sheet for Healthcare Providers: IncredibleEmployment.be  This test is not yet approved or cleared by the [place] FDA and has been authorized for detection and/or diagnosis of SARS-CoV-2 by FDA under an Emergency Use Authorization (EUA). This EUA will remain in effect (meaning this test can be used) for the duration of the COVID-19 declaration under Section 564(b)(1) of the Act, 21 U.S.C. section 360bbb-3(b)(1), unless the authorization is terminated or revoked.  Performed at [facility], [place] [address] [place], [place] [address]      RN Pressure Injury Documentation:     Estimated body mass index is 29.63 kg/m as calculated from the following:   Height as of this encounter: _0  (1.575 m).   Weight as of this encounter: 73.5 kg.  Malnutrition Type:   Malnutrition Characteristics:   Nutrition Interventions:     Radiology Studies: CT ABDOMEN PELVIS W CONTRAST  Result Date: 08/04/2020 CLINICAL DATA:  Provided history of: Cholecystitis (Ped 0-18y) possible biliary stent  obstruction Technologist notes state recent diagnosis of pancreatic cancer with biliary stent placed last month.  Abdominal pain, nausea, vomiting, and lethargy. EXAM: CT ABDOMEN AND PELVIS WITH CONTRAST TECHNIQUE: Multidetector CT imaging of the abdomen and pelvis was performed using the standard protocol following bolus administration of intravenous contrast. CONTRAST:  77m OMNIPAQUE IOHEXOL 350 MG/ML SOLN COMPARISON:  Abdominopelvic CT 05/29/2020, MRI 06/17/2020 FINDINGS: Lower chest: Small right pleural effusion. Bibasilar atelectasis. No basilar pulmonary nodule. Small hiatal hernia. Hepatobiliary: New ill-defined hypodensity in the periphery of the right lobe, series 2, image 14. Subtle low-density in the inferior right lobe series 2, image 33, at site of hypoenhancing lesion on MRI. Stable small subcapsular cyst in the right lobe, series 2, image 33. Biliary stent is in place with air and scattered fluid in the stent. No bile duct dilatation surround the stent. Resultant pneumobilia. There is no intrahepatic biliary ductal dilatation. Gallbladder is physiologically distended, there may be minimal gallbladder wall enhancement and wall thickening. No calcified gallstone. Pancreas: Ill-defined low-density in the pancreatic head with margins difficult to delineate, measuring approximately 3.3 x 2.3 cm, previously 3.6 x 2.6 cm. Mild atrophy of the distal pancreas. No ductal dilatation. No peripancreatic fat stranding. Spleen: Punctate granuloma.  Normal in size.  Splenule anteriorly. Adrenals/Urinary Tract: No adrenal nodule. Exophytic 4.7 cm cyst from the anterior right kidney. Smaller cyst in the lower right kidney. No hydronephrosis. No perinephric edema. There is symmetric renal excretion on delayed phase imaging. Minimally distended urinary bladder. Stomach/Bowel: Small to moderate hiatal hernia. Stomach is decompressed. There is no bowel obstruction or inflammation. Sigmoid diverticulosis without diverticulitis. Additional scattered diverticular seen in the more proximal colon. High-riding cecum in the right mid  abdomen. Diminutive appendix tentatively visualized. No appendicitis. Vascular/Lymphatic: Aortic atherosclerosis and tortuosity. No aortic aneurysm. The portal, splenic, superior mesenteric veins are patent. Small peripancreatic nodes are not enlarged by size criteria. No enlarged lymph nodes in the abdomen or pelvis. Reproductive: Prostate is unremarkable. Other: Ascites. No free air or focal fluid collection. There is a fat containing umbilical hernia. Moderate to large fat containing left inguinal hernia, small fat containing right inguinal hernia. 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 Fusion of L1 through L3 vertebral bodies. Trabeculated lesion within T10 is likely a hemangioma. No acute osseous abnormalities are seen. IMPRESSION: 1. Biliary stent in place with air and scattered fluid in the stent. No biliary dilatation. 2. Known pancreatic head mass with margins difficult to delineate, measuring approximately 3.3 x 2.3 cm, previously 3.6 x 2.6 cm. 3. New liver lesion suspicious for metastasis, there is a subtle hypodensity in the right lobe at site of suspected metastasis on MRI. 4. Slight gallbladder wall enhancement and wall thickening, nonspecific in the setting. 5. Small right pleural effusion. 6. Colonic diverticulosis without diverticulitis. 7. Hiatal hernia. 8. Fat containing umbilical and bilateral inguinal hernias. 9. 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 Aortic Atherosclerosis (ICD10-I70.0). Electronically Signed   By: MKeith RakeM.D.   On: 08/04/2020 19:23   DG Chest Portable 1 View  Result Date: 08/04/2020 CLINICAL DATA:  Fatigue EXAM: PORTABLE CHEST 1 VIEW COMPARISON:  CT 07/21/2019 FINDINGS: Linear densities in the lung bases could reflect scarring or atelectasis. Heart is normal size. No effusions or acute bony abnormality.  IMPRESSION: Bibasilar atelectasis or scarring Electronically Signed   By: KRolm BaptiseM.D.   On: 08/04/2020 16:55  US Abdomen Limited RUQ (LIVER/GB)  Result Date: 08/04/2020 CLINICAL DATA:  Right upper quadrant pain EXAM: ULTRASOUND ABDOMEN LIMITED RIGHT UPPER QUADRANT COMPARISON:  CT earlier today FINDINGS: Gallbladder: Shadowing noted from the gallbladder fundus, likely due to gas within the gallbladder. Gallbladder wall is thickened at 7 mm. No visible stones. Common bile duct: Diameter: Metallic biliary stent noted. Unable to measure the common bile duct. Liver: Pneumobilia. No focal hepatic abnormality. Portal vein is patent on color Doppler imaging with normal direction of blood flow towards the liver. Other: None. IMPRESSION: Pneumobilia related to biliary stent. Gallbladder wall thickening.  No visible stones. Electronically Signed   By: Rolm Baptise M.D.   On: 08/04/2020 21:18    Scheduled Meds:  apixaban  5 mg Oral BID   diltiazem  120 mg Oral Daily   metoprolol tartrate  50 mg Oral BID   Continuous Infusions:  cefTRIAXone (ROCEPHIN)  IV     metronidazole      LOS: 0 days   Kerney Elbe, DO [facility] PAGER is on AMION  If 7PM-7AM, please contact night-coverage [email or web address]

## 2020-08-05 NOTE — ED Notes (Signed)
Pt. Was able to ambulate to restroom.

## 2020-08-06 DIAGNOSIS — C250 Malignant neoplasm of head of pancreas: Secondary | ICD-10-CM | POA: Diagnosis not present

## 2020-08-06 DIAGNOSIS — R7989 Other specified abnormal findings of blood chemistry: Secondary | ICD-10-CM | POA: Diagnosis not present

## 2020-08-06 DIAGNOSIS — E876 Hypokalemia: Secondary | ICD-10-CM

## 2020-08-06 DIAGNOSIS — R7881 Bacteremia: Secondary | ICD-10-CM | POA: Diagnosis not present

## 2020-08-06 DIAGNOSIS — A419 Sepsis, unspecified organism: Secondary | ICD-10-CM | POA: Diagnosis not present

## 2020-08-06 LAB — CBC WITH DIFFERENTIAL/PLATELET
Abs Immature Granulocytes: 0.08 10*3/uL — ABNORMAL HIGH (ref 0.00–0.07)
Basophils Absolute: 0 10*3/uL (ref 0.0–0.1)
Basophils Relative: 0 %
Eosinophils Absolute: 0 10*3/uL (ref 0.0–0.5)
Eosinophils Relative: 0 %
HCT: 34.8 % — ABNORMAL LOW (ref 39.0–52.0)
Hemoglobin: 11 g/dL — ABNORMAL LOW (ref 13.0–17.0)
Immature Granulocytes: 1 %
Lymphocytes Relative: 10 %
Lymphs Abs: 1.4 10*3/uL (ref 0.7–4.0)
MCH: 29.8 pg (ref 26.0–34.0)
MCHC: 31.6 g/dL (ref 30.0–36.0)
MCV: 94.3 fL (ref 80.0–100.0)
Monocytes Absolute: 1 10*3/uL (ref 0.1–1.0)
Monocytes Relative: 7 %
Neutro Abs: 12.1 10*3/uL — ABNORMAL HIGH (ref 1.7–7.7)
Neutrophils Relative %: 82 %
Platelets: 253 10*3/uL (ref 150–400)
RBC: 3.69 MIL/uL — ABNORMAL LOW (ref 4.22–5.81)
RDW: 14.4 % (ref 11.5–15.5)
WBC: 14.6 10*3/uL — ABNORMAL HIGH (ref 4.0–10.5)
nRBC: 0 % (ref 0.0–0.2)

## 2020-08-06 LAB — IRON AND TIBC
Iron: 17 ug/dL — ABNORMAL LOW (ref 45–182)
Saturation Ratios: 7 % — ABNORMAL LOW (ref 17.9–39.5)
TIBC: 241 ug/dL — ABNORMAL LOW (ref 250–450)
UIBC: 224 ug/dL

## 2020-08-06 LAB — COMPREHENSIVE METABOLIC PANEL
ALT: 111 U/L — ABNORMAL HIGH (ref 0–44)
AST: 49 U/L — ABNORMAL HIGH (ref 15–41)
Albumin: 3 g/dL — ABNORMAL LOW (ref 3.5–5.0)
Alkaline Phosphatase: 203 U/L — ABNORMAL HIGH (ref 38–126)
Anion gap: 9 (ref 5–15)
BUN: 13 mg/dL (ref 8–23)
CO2: 20 mmol/L — ABNORMAL LOW (ref 22–32)
Calcium: 8 mg/dL — ABNORMAL LOW (ref 8.9–10.3)
Chloride: 107 mmol/L (ref 98–111)
Creatinine, Ser: 0.71 mg/dL (ref 0.61–1.24)
GFR, Estimated: >60 mL/min (ref >60)
Glucose, Bld: 131 mg/dL — ABNORMAL HIGH (ref 70–99)
Potassium: 3.3 mmol/L — ABNORMAL LOW (ref 3.5–5.1)
Sodium: 136 mmol/L (ref 135–145)
Total Bilirubin: 1.6 mg/dL — ABNORMAL HIGH (ref 0.3–1.2)
Total Protein: 6.4 g/dL — ABNORMAL LOW (ref 6.5–8.1)

## 2020-08-06 LAB — RETICULOCYTES
Immature Retic Fract: 23.3 % — ABNORMAL HIGH (ref 2.3–15.9)
RBC.: 3.66 MIL/uL — ABNORMAL LOW (ref 4.22–5.81)
Retic Count, Absolute: 50.5 10*3/uL (ref 19.0–186.0)
Retic Ct Pct: 1.4 % (ref 0.4–3.1)

## 2020-08-06 LAB — C DIFFICILE QUICK SCREEN W PCR REFLEX
C Diff antigen: NEGATIVE
C Diff interpretation: NEGATIVE
C Diff toxin: NEGATIVE

## 2020-08-06 LAB — PHOSPHORUS: Phosphorus: 1.6 mg/dL — ABNORMAL LOW (ref 2.5–4.6)

## 2020-08-06 LAB — MAGNESIUM: Magnesium: 1.9 mg/dL (ref 1.7–2.4)

## 2020-08-06 LAB — FERRITIN: Ferritin: 333 ng/mL (ref 24–336)

## 2020-08-06 LAB — VITAMIN B12: Vitamin B-12: 1477 pg/mL — ABNORMAL HIGH (ref 180–914)

## 2020-08-06 LAB — FOLATE: Folate: 14.1 ng/mL (ref >5.9)

## 2020-08-06 MED ORDER — TRAZODONE HCL 50 MG PO TABS
50.0000 mg | ORAL_TABLET | Freq: Every evening | ORAL | Status: DC | PRN
  Administered 2020-08-06 – 2020-08-14 (×8): 50 mg via ORAL
  Filled 2020-08-06 (×8): qty 1

## 2020-08-06 MED ORDER — POTASSIUM CHLORIDE CRYS ER 10 MEQ PO TBCR
40.0000 meq | EXTENDED_RELEASE_TABLET | Freq: Once | ORAL | Status: AC
  Administered 2020-08-06: 40 meq via ORAL
  Filled 2020-08-06: qty 4

## 2020-08-06 MED ORDER — POTASSIUM PHOSPHATES 15 MMOLE/5ML IV SOLN
20.0000 mmol | Freq: Once | INTRAVENOUS | Status: AC
  Administered 2020-08-06: 20 mmol via INTRAVENOUS
  Filled 2020-08-06: qty 6.67

## 2020-08-06 NOTE — Care Management (Signed)
Pt HR and B/P elevated gave pain med, reassessed started yellow muse. Admin heart and b/p meds. Pt returned to [facility].

## 2020-08-06 NOTE — Progress Notes (Signed)
[facility] Gastroenterology Progress Note  Barry Contreras. 87 y.o. 1931-07-13   Subjective: Feels ok. Denies abdominal pain. Tolerating liquid diet. Wife and son in room.  Objective: Vital signs: Vitals:   08/06/20 1242 08/06/20 1450  BP: (!) 152/103 (!) 161/102  Pulse: 91 94  Resp: 18   Temp: 97.8 F (36.6 C) 97.7 F (36.5 C)  SpO2: 100% 96%    Physical Exam: Gen: lethargic, elderly, well-nourished, no acute distress  HEENT: anicteric sclera CV: RRR Chest: CTA B Abd: epigastric and RUQ tenderness with guarding, soft, nondistended, +BS Ext: no edema Psych: normal mood, affect  Lab Results: Recent Labs    08/05/20 0429 08/06/20 0651  NA 136 136  K 3.5 3.3*  CL 107 107  CO2 21* 20*  GLUCOSE 148* 131*  BUN 15 13  CREATININE 0.67 0.71  CALCIUM 8.0* 8.0*  MG  --  1.9  PHOS  --  1.6*   Recent Labs    08/05/20 0429 08/06/20 0651  AST 95* 49*  ALT 151* 111*  ALKPHOS 203* 203*  BILITOT 2.3* 1.6*  PROT 5.8* 6.4*  ALBUMIN 2.9* 3.0*   Recent Labs    08/04/20 1704 08/05/20 0429 08/06/20 0651  WBC 17.2* 14.7* 14.6*  NEUTROABS 15.0*  --  12.1*  HGB 12.6* 10.0* 11.0*  HCT 39.5 30.9* 34.8*  MCV 94.3 93.9 94.3  PLT 297 218 253      Assessment/Plan: Metastatic pancreatic cancer with metal biliary stent placed last month. LFTs improving. Fever has resolved. Continue IV Abx. I think the biliary stent is patent and would continue medical management. Defer to Dr. Benay Spice whether to proceed with port placement with bacteremia. Will d/w Dr. Watt Climes on patency of biliary stent. Changed to soft diet. Answered wife and son's questions. Will follow.   [name] 08/06/2020, 3:53 PM  Questions please call [phone] Patient ID: Barry Contreras., male   DOB: 05-26-1931, 87 y.o.   MRN: [id number]

## 2020-08-06 NOTE — Care Management (Signed)
Pt is hungry and getting more anxious. He has not been able to get much sleep requested something to help him sleep.

## 2020-08-06 NOTE — Care Management (Signed)
Pt got up to shower fist time I have heard him cough.deep raspy pt states it is productive but unaware of the color or consistency. Advise pt to assess next time and let nursing staff aware.

## 2020-08-06 NOTE — Progress Notes (Signed)
Patient called nurse to the room with complaints of increasingly abdominal discomfort that's causing him to be SOB at rest. Patient encouraged to elevate HOB to help with breathing, patient declined. Patient's O2 on room air, 92%. Breath sounds diminished with expiratory wheezes. Patient placed on 2L for comfort, O2 was 95% percent. Patient requested to be bumped up just a little, patient placed on 3L O2 which resulted in O2 saturation to be 96%. Abdomen distended, bowel sounds are present. Hospitalist messaged via secure chat and made aware of findings. Instructed to continue monitoring and report any changes.

## 2020-08-06 NOTE — Progress Notes (Signed)
PROGRESS NOTE    Barry Contreras.  FTD:[id number] DOB: [birth year] DOA: 08/04/2020 PCP: Lajean Manes, MD   Brief Narrative:  The patient is an 87 year old Caucasian male with a past medical history significant for but not limited to pancreatic cancer with recent systemic chemotherapy, history of obstructive jaundice status post biliary stent placement, proximal atrial fibrillation on anticoagulant with Eliquis, hypertension, prediabetes as well as other comorbidities who presented with a chief complaint of fever, nausea, vomiting.  Patient daughter was at bedside when he came in and he was having chills and rigors.  Daughter just come into town to visit and states that yesterday began to develop fevers or chills.  The patient had 1 episode of nausea vomiting but no diarrhea.  No chest pain.  He has a history of obstructive jaundice and is status post biliary stent placement with [facility] in 07/04/2020.  In the ED is found to be tachycardic and tachypneic with a lactic acid of 3.4 and abnormal LFTs.  He underwent a CT of the abdomen and pelvis which showed pneumobilia with slight gallbladder wall enhancement and wall thickening.  A dedicated right upper quadrant ultrasound showed gallbladder wall thickening with no visible stone.  The ED discussed with Dr. Michail Sermon who recommended the patient to be kept n.p.o. and continue antibiotics.  General surgery was also consulted and recommended the GI evaluation and he started on IV cefepime and Flagyl and this has now been de-escalated to IV ceftriaxone given that further work-up is revealed that he got a Klebsiella bacteremia.  He was placed on a clear liquid diet by GI and is this is now may advance to full liquid diet today.  Assessment & Plan:   Principal Problem:   Sepsis ([place]) Active Problems:   Hypertension   Paroxysmal atrial fibrillation (HCC)   Cancer of head of pancreas (HCC)   Elevated LFTs   Serum total bilirubin elevated    Bacteremia  Sepsis secondary to intra-abdominal infection and Klebsiella bacteremia, present admission -Patient met sepsis criteria as he is febrile, tachycardic and had a leukocytosis and a: Sepsis physiology is now improving -He is status post biliary stent placement on 06/26/2020 for obstructive jaundice and right upper quadrant ultrasound showed pneumobilia -GI consulted and recommending continue IV antibiotics keeping the patient n.p.o. -He started on IV cefepime and Flagyl but this has been de-escalated to IV ceftriaxone given that he is a Klebsiella pneumoniae with Sensitivities Pending  -He received 2 L of IV fluid in the ED and he is currently getting IV fluids at 150 cc/h -Lactic acid level was elevated and trending down at 2.3; on presentation was 5.3 -WBC went from 17.2 is now 14.7 yesterday and today is 14.6 and slightly better -GI recommending discontinuing antibiotics and supportive care -He is now on a clear liquid diet and this has been advanced to a full liquid diet by GI  Abnormal LFTs/elevated LFTs -In the setting of intrahepatic infection from biliary stent -Patient has CT of the abdomen pelvis as well as right upper quadrant ultrasound -AST went from 192 is now trended down to 95 and is now further improved to 49 -ALT went from 220s now trended down to 151 and is now further improved to 111 -Acute hepatitis panel was negative -He also has metastatic liver disease and initially planned to have a liver biopsy on 07/20/2020 but this was aborted suspicious for not well seen on ultrasound  -Has been consulted in the feel that the metal biliary  stent remains patent and they do not think that repeat ERCP and additional stent placement is needed at this time  Hyperbilirubinemia -Has been chronically elevated in the setting of his obstructive jaundice and is now gone from 2.4 -> 2.3 -> 1.6 -Continue monitor and trend and repeat CMP in a.m.  History of pancreatic cancer suspected  mets to liver -Recently underwent chemotherapy last week -Follows with Dr. Benay Spice who will be consulted and added to the care teams -Patient has a Port-A-Cath placement for systemic chemotherapy scheduled for August -Will have Dr. Hillery Aldo Weight in   Metabolic Acidosis -Mild -Patient's CO2 was 20, anion gap is 9, chloride level is 107 -C/w NS at 75 mL/hr -Continue to monitor and trend and repeat CMP in a.m.  Hypokalemia -Patient's K+ was 3.3 -Replete with IV K Phos 20 mmol and po Kcl 40 mEQ  -Continue to Monitor and Replete as Necessary -Repeat CMP in the AM  Hypophosphatemia -Patient's Phos level was 1.6 -Replete with IV K Phos 20 mmol -Continue to Monitor and Replete as Necessary -Repeat CMP in the AM  Proximal Atrial Fibrillation -Currently patient is an A. Fib but is rate controlled -Anticoagulation with apixaban -Continue with diltiazem 120 once p.o. daily -Continue telemetry monitoring -HR still Tachycardic and in A Fib   Normocytic Anemia  -Likely dilutional drop -Patient's hemoglobin/hematocrit on admission we will 12.6/39.5 and trended down to 10.0/30.9 and today improved to 11.0/34.8 -Check anemia panel and showed an iron level of 17, U IBC of 224, TIBC of 241, saturation ratios of 7%, ferritin level 333, folate level 14.1, and vitamin B12 level 1477 -continue monitor for signs or symptoms of bleeding; currently no overt bleeding noted -Repeat CBC in a.m.  DVT prophylaxis: Anticoagulated with apixaban Code Status: FULL CODE Family Communication: Discussed with daughter at bedside  Disposition Plan: Pending further clinical improvement and clearance by the specialist Status is: Inpatient  Remains inpatient appropriate because:IV treatments appropriate due to intensity of illness or inability to take PO and Inpatient level of care appropriate due to severity of illness  Dispo: The patient is from: Home              Anticipated d/c is to:  TBD               Patient currently is not medically stable to d/c.   Difficult to place patient No  Consultants:  Gastroenterology   Procedures: None  Antimicrobials:  Anti-infectives (From admission, onward)    Start     Dose/Rate Route Frequency Ordered Stop   08/05/20 2200  metroNIDAZOLE (FLAGYL) IVPB 500 mg        500 mg 100 mL/hr over 60 Minutes Intravenous Every 12 hours 08/05/20 1347     08/05/20 2000  cefTRIAXone (ROCEPHIN) 2 g in sodium chloride 0.9 % 100 mL IVPB        2 g 200 mL/hr over 30 Minutes Intravenous Every 24 hours 08/05/20 1347     08/05/20 0800  ceFEPIme (MAXIPIME) 2 g in sodium chloride 0.9 % 100 mL IVPB  Status:  Discontinued        2 g 200 mL/hr over 30 Minutes Intravenous Every 12 hours 08/04/20 2218 08/05/20 1347   08/04/20 2200  metroNIDAZOLE (FLAGYL) IVPB 500 mg  Status:  Discontinued        500 mg 100 mL/hr over 60 Minutes Intravenous Every 8 hours 08/04/20 2158 08/05/20 1347   08/04/20 1900  ceFEPIme (MAXIPIME) 2 g in sodium chloride  0.9 % 100 mL IVPB        2 g 200 mL/hr over 30 Minutes Intravenous  Once 08/04/20 1858 08/04/20 2046   08/04/20 1900  metroNIDAZOLE (FLAGYL) IVPB 500 mg        500 mg 100 mL/hr over 60 Minutes Intravenous  Once 08/04/20 1858 08/04/20 2046        Subjective: Seen and examined at bedside and states his abdomen is a little tighter this morning but feels better after the heating pad.  Wanting his diet to be advanced.  No nausea or vomiting.  Feels okay.  No other concerns or complaints at this time.  Objective: Vitals:   08/06/20 1143 08/06/20 1205 08/06/20 1242 08/06/20 1450  BP: (!) 159/110  (!) 152/103 (!) 161/102  Pulse: (!) 43 99 91 94  Resp: (!) '21 20 18   ' Temp:  97.6 F (36.4 C) 97.8 F (36.6 C) 97.7 F (36.5 C)  TempSrc:   Oral Oral  SpO2: 97% 97% 100% 96%  Weight:      Height:        Intake/Output Summary (Last 24 hours) at 08/06/2020 1519 Last data filed at 08/06/2020 6834 Gross per 24 hour  Intake 972.84 ml   Output 2 ml  Net 970.84 ml    Filed Weights   08/04/20 1608  Weight: 73.5 kg   Examination: Physical Exam:  Constitutional: WN/WD overweight elderly Caucasian male currently in no acute distress appears calm but mildly uncomfortable  Eyes: Lids and conjunctivae normal, sclerae anicteric  ENMT: External Ears, Nose appear normal.  He is hard of hearing Neck: Appears normal, supple, no cervical masses, normal ROM, no appreciable thyromegaly; no JVD Respiratory: Diminished to auscultation bilaterally, no wheezing, rales, rhonchi or crackles. Normal respiratory effort and patient is not tachypenic. No accessory muscle use.  Unlabored breathing Cardiovascular: Irregularly irregular and slightly tachycardic, no murmurs / rubs / gallops. S1 and S2 auscultated.  Is 1+ lower extremity Abdomen: Soft, non-tender, distended. Bowel sounds positive.  GU: Deferred. Musculoskeletal: No clubbing / cyanosis of digits/nails. No joint deformity upper and lower extremities.  Skin: No rashes, lesions, ulcers. No induration; Warm and dry.  Neurologic: CN 2-12 grossly intact with no focal deficits. Romberg sign and cerebellar reflexes not assessed.  Psychiatric: Normal judgment and insight. Alert and oriented x 3. Normal mood and appropriate affect.   Data Reviewed: I have personally reviewed following labs and imaging studies  CBC: Recent Labs  Lab 08/04/20 1704 08/05/20 0429 08/06/20 0651  WBC 17.2* 14.7* 14.6*  NEUTROABS 15.0*  --  12.1*  HGB 12.6* 10.0* 11.0*  HCT 39.5 30.9* 34.8*  MCV 94.3 93.9 94.3  PLT 297 218 196    Basic Metabolic Panel: Recent Labs  Lab 08/04/20 1704 08/05/20 0429 08/06/20 0651  NA 136 136 136  K 4.0 3.5 3.3*  CL 100 107 107  CO2 24 21* 20*  GLUCOSE 142* 148* 131*  BUN '15 15 13  ' CREATININE 0.89 0.67 0.71  CALCIUM 9.6 8.0* 8.0*  MG  --   --  1.9  PHOS  --   --  1.6*    GFR: Estimated Creatinine Clearance: 58.3 mL/min (by C-G formula based on SCr of 0.71  mg/dL). Liver Function Tests: Recent Labs  Lab 08/04/20 1704 08/05/20 0429 08/06/20 0651  AST 192* 95* 49*  ALT 220* 151* 111*  ALKPHOS 263* 203* 203*  BILITOT 2.4* 2.3* 1.6*  PROT 7.1 5.8* 6.4*  ALBUMIN 3.7 2.9* 3.0*  Recent Labs  Lab 08/04/20 1704  LIPASE 36    No results for input(s): AMMONIA in the last 168 hours. Coagulation Profile: Recent Labs  Lab 08/04/20 1858  INR 1.4*    Cardiac Enzymes: No results for input(s): CKTOTAL, CKMB, CKMBINDEX, TROPONINI in the last 168 hours. BNP (last 3 results) No results for input(s): PROBNP in the last 8760 hours. HbA1C: No results for input(s): HGBA1C in the last 72 hours. CBG: No results for input(s): GLUCAP in the last 168 hours. Lipid Profile: No results for input(s): CHOL, HDL, LDLCALC, TRIG, CHOLHDL, LDLDIRECT in the last 72 hours. Thyroid Function Tests: No results for input(s): TSH, T4TOTAL, FREET4, T3FREE, THYROIDAB in the last 72 hours. Anemia Panel: Recent Labs    08/06/20 0651  VITAMINB12 1,477*  FOLATE 14.1  FERRITIN 333  TIBC 241*  IRON 17*  RETICCTPCT 1.4   Sepsis Labs: Recent Labs  Lab 08/04/20 1704 08/04/20 1826 08/04/20 2200 08/05/20 0100  LATICACIDVEN 3.4* 2.5* 5.3* 2.3*     Recent Results (from the past 240 hour(s))  Blood Culture (routine x 2)     Status: None (Preliminary result)   Collection Time: 08/04/20  7:18 PM   Specimen: BLOOD  Result Value Ref Range Status   Specimen Description   Final    BLOOD RIGHT ANTECUBITAL Performed at [facility], [place] [address], [place], [place] [address]    Special Requests   Final    BOTTLES DRAWN AEROBIC AND ANAEROBIC Blood Culture adequate volume Performed at [facility] [address] [place], [place] [address]    Culture  Setup Time   Final    GRAM NEGATIVE RODS IN BOTH AEROBIC AND ANAEROBIC BOTTLES CRITICAL VALUE NOTED.  VALUE IS CONSISTENT WITH PREVIOUSLY REPORTED AND CALLED VALUE.     Culture   Final    GRAM NEGATIVE RODS IDENTIFICATION TO FOLLOW Performed at [facility], [place] [address], [place], [place] [address]    Report Status PENDING  Incomplete  Blood Culture (routine x 2)     Status: Abnormal (Preliminary result)   Collection Time: 08/04/20  7:25 PM   Specimen: BLOOD  Result Value Ref Range Status   Specimen Description   Final    BLOOD WRIST LEFT Performed at [facility] [address], [place], [place] [address]    Special Requests   Final    BOTTLES DRAWN AEROBIC AND ANAEROBIC Blood Culture adequate volume Performed at [facility] [address], [place], [place] [address]    Culture  Setup Time   Final    GRAM NEGATIVE RODS IN BOTH AEROBIC AND ANAEROBIC BOTTLES CRITICAL RESULT CALLED TO, READ BACK BY AND VERIFIED WITH: CHRISTINE SHADE PHARMD '@1249'  08/05/20 EB    Culture (A)  Final    KLEBSIELLA PNEUMONIAE SUSCEPTIBILITIES TO FOLLOW Performed at [facility], [place] [address], [place], [place] [address]    Report Status PENDING  Incomplete  Blood Culture ID Panel (Reflexed)     Status: Abnormal   Collection Time: 08/04/20  7:25 PM  Result Value Ref Range Status   Enterococcus faecalis NOT DETECTED NOT DETECTED Final   Enterococcus Faecium NOT DETECTED NOT DETECTED Final   Listeria monocytogenes NOT DETECTED NOT DETECTED Final   Staphylococcus species NOT DETECTED NOT DETECTED Final   Staphylococcus aureus (BCID) NOT DETECTED NOT DETECTED Final   Staphylococcus epidermidis NOT DETECTED NOT DETECTED Final   Staphylococcus lugdunensis NOT DETECTED NOT DETECTED Final  Streptococcus species NOT DETECTED NOT DETECTED Final   Streptococcus agalactiae NOT DETECTED NOT DETECTED Final   Streptococcus pneumoniae NOT DETECTED NOT DETECTED Final   Streptococcus pyogenes NOT DETECTED NOT DETECTED Final   A.calcoaceticus-baumannii NOT DETECTED NOT DETECTED Final   Bacteroides fragilis NOT DETECTED NOT  DETECTED Final   Enterobacterales DETECTED (A) NOT DETECTED Final    Comment: Enterobacterales represent a large order of gram negative bacteria, not a single organism. CRITICAL RESULT CALLED TO, READ BACK BY AND VERIFIED WITH: [place] '@1249'  08/05/20 EB    Enterobacter cloacae complex NOT DETECTED NOT DETECTED Final   Escherichia coli NOT DETECTED NOT DETECTED Final   Klebsiella aerogenes NOT DETECTED NOT DETECTED Final   Klebsiella oxytoca NOT DETECTED NOT DETECTED Final   Klebsiella pneumoniae DETECTED (A) NOT DETECTED Final    Comment: CRITICAL RESULT CALLED TO, READ BACK BY AND VERIFIED WITH: CHRISTINE SHADE PHARMD '@1249'  08/05/20 EB    Proteus species NOT DETECTED NOT DETECTED Final   Salmonella species NOT DETECTED NOT DETECTED Final   Serratia marcescens NOT DETECTED NOT DETECTED Final   Haemophilus influenzae NOT DETECTED NOT DETECTED Final   Neisseria meningitidis NOT DETECTED NOT DETECTED Final   Pseudomonas aeruginosa NOT DETECTED NOT DETECTED Final   Stenotrophomonas maltophilia NOT DETECTED NOT DETECTED Final   Candida albicans NOT DETECTED NOT DETECTED Final   Candida auris NOT DETECTED NOT DETECTED Final   Candida glabrata NOT DETECTED NOT DETECTED Final   Candida krusei NOT DETECTED NOT DETECTED Final   Candida parapsilosis NOT DETECTED NOT DETECTED Final   Candida tropicalis NOT DETECTED NOT DETECTED Final   Cryptococcus neoformans/gattii NOT DETECTED NOT DETECTED Final   CTX-M ESBL NOT DETECTED NOT DETECTED Final   Carbapenem resistance IMP NOT DETECTED NOT DETECTED Final   Carbapenem resistance KPC NOT DETECTED NOT DETECTED Final   Carbapenem resistance NDM NOT DETECTED NOT DETECTED Final   Carbapenem resist OXA 48 LIKE NOT DETECTED NOT DETECTED Final   Carbapenem resistance VIM NOT DETECTED NOT DETECTED Final    Comment: Performed at [facility], [address] [place], [place] [address]  Resp Panel by RT-PCR (Flu A&B, Covid) Nasopharyngeal  Swab     Status: None   Collection Time: 08/04/20  7:45 PM   Specimen: Nasopharyngeal Swab; Nasopharyngeal(NP) swabs in vial transport medium  Result Value Ref Range Status   SARS Coronavirus 2 by RT PCR NEGATIVE NEGATIVE Final    Comment: (NOTE) SARS-CoV-2 target nucleic acids are NOT DETECTED.  The SARS-CoV-2 RNA is generally detectable in upper respiratory specimens during the acute phase of infection. The lowest concentration of SARS-CoV-2 viral copies this assay can detect is 138 copies/mL. A negative result does not preclude SARS-Cov-2 infection and should not be used as the sole basis for treatment or other patient management decisions. A negative result may occur with  improper specimen collection/handling, submission of specimen other than nasopharyngeal swab, presence of viral mutation(s) within the areas targeted by this assay, and inadequate number of viral copies(<138 copies/mL). A negative result must be combined with clinical observations, patient history, and epidemiological information. The expected result is Negative.  Fact Sheet for Patients:  EntrepreneurPulse.com.au  Fact Sheet for Healthcare Providers:  IncredibleEmployment.be  This test is no t yet approved or cleared by the [place] FDA and  has been authorized for detection and/or diagnosis of SARS-CoV-2 by FDA under an Emergency Use Authorization (EUA). This EUA will remain  in effect (meaning this test can be  used) for the duration of the COVID-19 declaration under Section 564(b)(1) of the Act, 21 U.S.C.section 360bbb-3(b)(1), unless the authorization is terminated  or revoked sooner.       Influenza A by PCR NEGATIVE NEGATIVE Final   Influenza B by PCR NEGATIVE NEGATIVE Final    Comment: (NOTE) The Xpert Xpress SARS-CoV-2/FLU/RSV plus assay is intended as an aid in the diagnosis of influenza from Nasopharyngeal swab specimens and should not be used as a sole  basis for treatment. Nasal washings and aspirates are unacceptable for Xpert Xpress SARS-CoV-2/FLU/RSV testing.  Fact Sheet for Patients: EntrepreneurPulse.com.au  Fact Sheet for Healthcare Providers: IncredibleEmployment.be  This test is not yet approved or cleared by the [place] FDA and has been authorized for detection and/or diagnosis of SARS-CoV-2 by FDA under an Emergency Use Authorization (EUA). This EUA will remain in effect (meaning this test can be used) for the duration of the COVID-19 declaration under Section 564(b)(1) of the Act, 21 U.S.C. section 360bbb-3(b)(1), unless the authorization is terminated or revoked.  Performed at [facility], [place] [address] [place], [place] [address]   C Difficile Quick Screen w PCR reflex     Status: None   Collection Time: 08/06/20  1:41 PM   Specimen: STOOL  Result Value Ref Range Status   C Diff antigen NEGATIVE NEGATIVE Final   C Diff toxin NEGATIVE NEGATIVE Final   C Diff interpretation NEGATIVE  Final    Comment: Performed at [facility], [place] [address], [place], [place] [address]      RN Pressure Injury Documentation:     Estimated body mass index is 29.63 kg/m as calculated from the following:   Height as of this encounter: '5\' 2"'  (1.575 m).   Weight as of this encounter: 73.5 kg.  Malnutrition Type:   Malnutrition Characteristics:   Nutrition Interventions:     Radiology Studies: CT ABDOMEN PELVIS W CONTRAST  Result Date: 08/04/2020 CLINICAL DATA:  Provided history of: Cholecystitis (Ped 0-18y) possible biliary stent obstruction Technologist notes state recent diagnosis of pancreatic cancer with biliary stent placed last month. Abdominal pain, nausea, vomiting, and lethargy. EXAM: CT ABDOMEN AND PELVIS WITH CONTRAST TECHNIQUE: Multidetector CT imaging of the abdomen and pelvis was performed using the standard protocol following bolus  administration of intravenous contrast. CONTRAST:  31m OMNIPAQUE IOHEXOL 350 MG/ML SOLN COMPARISON:  Abdominopelvic CT 05/29/2020, MRI 06/17/2020 FINDINGS: Lower chest: Small right pleural effusion. Bibasilar atelectasis. No basilar pulmonary nodule. Small hiatal hernia. Hepatobiliary: New ill-defined hypodensity in the periphery of the right lobe, series 2, image 14. Subtle low-density in the inferior right lobe series 2, image 33, at site of hypoenhancing lesion on MRI. Stable small subcapsular cyst in the right lobe, series 2, image 33. Biliary stent is in place with air and scattered fluid in the stent. No bile duct dilatation surround the stent. Resultant pneumobilia. There is no intrahepatic biliary ductal dilatation. Gallbladder is physiologically distended, there may be minimal gallbladder wall enhancement and wall thickening. No calcified gallstone. Pancreas: Ill-defined low-density in the pancreatic head with margins difficult to delineate, measuring approximately 3.3 x 2.3 cm, previously 3.6 x 2.6 cm. Mild atrophy of the distal pancreas. No ductal dilatation. No peripancreatic fat stranding. Spleen: Punctate granuloma.  Normal in size.  Splenule anteriorly. Adrenals/Urinary Tract: No adrenal nodule. Exophytic 4.7 cm cyst from the anterior right kidney. Smaller cyst in the lower right kidney. No hydronephrosis. No perinephric edema. There is symmetric renal excretion on delayed phase  imaging. Minimally distended urinary bladder. Stomach/Bowel: Small to moderate hiatal hernia. Stomach is decompressed. There is no bowel obstruction or inflammation. Sigmoid diverticulosis without diverticulitis. Additional scattered diverticular seen in the more proximal colon. High-riding cecum in the right mid abdomen. Diminutive appendix tentatively visualized. No appendicitis. Vascular/Lymphatic: Aortic atherosclerosis and tortuosity. No aortic aneurysm. The portal, splenic, superior mesenteric veins are patent. Small  peripancreatic nodes are not enlarged by size criteria. No enlarged lymph nodes in the abdomen or pelvis. Reproductive: Prostate is unremarkable. Other: Ascites. No free air or focal fluid collection. There is a fat containing umbilical hernia. Moderate to large fat containing left inguinal hernia, small fat containing right inguinal hernia. 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 Fusion of L1 through L3 vertebral bodies. Trabeculated lesion within T10 is likely a hemangioma. No acute osseous abnormalities are seen. IMPRESSION: 1. Biliary stent in place with air and scattered fluid in the stent. No biliary dilatation. 2. Known pancreatic head mass with margins difficult to delineate, measuring approximately 3.3 x 2.3 cm, previously 3.6 x 2.6 cm. 3. New liver lesion suspicious for metastasis, there is a subtle hypodensity in the right lobe at site of suspected metastasis on MRI. 4. Slight gallbladder wall enhancement and wall thickening, nonspecific in the setting. 5. Small right pleural effusion. 6. Colonic diverticulosis without diverticulitis. 7. Hiatal hernia. 8. Fat containing umbilical and bilateral inguinal hernias. 9. 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 Aortic Atherosclerosis (ICD10-I70.0). Electronically Signed   By: Keith Rake M.D.   On: 08/04/2020 19:23   DG Chest Portable 1 View  Result Date: 08/04/2020 CLINICAL DATA:  Fatigue EXAM: PORTABLE CHEST 1 VIEW COMPARISON:  CT 07/21/2019 FINDINGS: Linear densities in the lung bases could reflect scarring or atelectasis. Heart is normal size. No effusions or acute bony abnormality. IMPRESSION: Bibasilar atelectasis or scarring Electronically Signed   By: Rolm Baptise M.D.   On: 08/04/2020 16:55   US Abdomen Limited RUQ (LIVER/GB)  Result Date: 08/04/2020 CLINICAL DATA:  Right upper  quadrant pain EXAM: ULTRASOUND ABDOMEN LIMITED RIGHT UPPER QUADRANT COMPARISON:  CT earlier today FINDINGS: Gallbladder: Shadowing noted from the gallbladder fundus, likely due to gas within the gallbladder. Gallbladder wall is thickened at 7 mm. No visible stones. Common bile duct: Diameter: Metallic biliary stent noted. Unable to measure the common bile duct. Liver: Pneumobilia. No focal hepatic abnormality. Portal vein is patent on color Doppler imaging with normal direction of blood flow towards the liver. Other: None. IMPRESSION: Pneumobilia related to biliary stent. Gallbladder wall thickening.  No visible stones. Electronically Signed   By: Rolm Baptise M.D.   On: 08/04/2020 21:18    Scheduled Meds:  apixaban  5 mg Oral BID   diltiazem  120 mg Oral Daily   metoprolol tartrate  50 mg Oral BID   Continuous Infusions:  sodium chloride 75 mL/hr at 08/06/20 0854   cefTRIAXone (ROCEPHIN)  IV Stopped (08/05/20 2042)   metronidazole 500 mg (08/06/20 1029)   potassium PHOSPHATE IVPB (in mmol) 20 mmol (08/06/20 1129)    LOS: 1 day   Kerney Elbe, DO [facility] PAGER is on AMION  If 7PM-7AM, please contact night-coverage [email or web address]

## 2020-08-07 ENCOUNTER — Inpatient Hospital Stay (HOSPITAL_COMMUNITY): Payer: Medicare PPO

## 2020-08-07 ENCOUNTER — Other Ambulatory Visit: Payer: Self-pay

## 2020-08-07 DIAGNOSIS — C250 Malignant neoplasm of head of pancreas: Secondary | ICD-10-CM

## 2020-08-07 DIAGNOSIS — A414 Sepsis due to anaerobes: Secondary | ICD-10-CM

## 2020-08-07 DIAGNOSIS — I4891 Unspecified atrial fibrillation: Secondary | ICD-10-CM

## 2020-08-07 DIAGNOSIS — A419 Sepsis, unspecified organism: Secondary | ICD-10-CM | POA: Diagnosis not present

## 2020-08-07 DIAGNOSIS — R7881 Bacteremia: Secondary | ICD-10-CM | POA: Diagnosis not present

## 2020-08-07 DIAGNOSIS — R7989 Other specified abnormal findings of blood chemistry: Secondary | ICD-10-CM | POA: Diagnosis not present

## 2020-08-07 LAB — COMPREHENSIVE METABOLIC PANEL
ALT: 79 U/L — ABNORMAL HIGH (ref 0–44)
AST: 27 U/L (ref 15–41)
Albumin: 3.1 g/dL — ABNORMAL LOW (ref 3.5–5.0)
Alkaline Phosphatase: 182 U/L — ABNORMAL HIGH (ref 38–126)
Anion gap: 6 (ref 5–15)
BUN: 10 mg/dL (ref 8–23)
CO2: 23 mmol/L (ref 22–32)
Calcium: 8.1 mg/dL — ABNORMAL LOW (ref 8.9–10.3)
Chloride: 107 mmol/L (ref 98–111)
Creatinine, Ser: 0.6 mg/dL — ABNORMAL LOW (ref 0.61–1.24)
GFR, Estimated: >60 mL/min (ref >60)
Glucose, Bld: 123 mg/dL — ABNORMAL HIGH (ref 70–99)
Potassium: 3.4 mmol/L — ABNORMAL LOW (ref 3.5–5.1)
Sodium: 136 mmol/L (ref 135–145)
Total Bilirubin: 1.3 mg/dL — ABNORMAL HIGH (ref 0.3–1.2)
Total Protein: 6.3 g/dL — ABNORMAL LOW (ref 6.5–8.1)

## 2020-08-07 LAB — MAGNESIUM: Magnesium: 2.1 mg/dL (ref 1.7–2.4)

## 2020-08-07 LAB — CBC WITH DIFFERENTIAL/PLATELET
Abs Immature Granulocytes: 0.07 10*3/uL (ref 0.00–0.07)
Basophils Absolute: 0 10*3/uL (ref 0.0–0.1)
Basophils Relative: 0 %
Eosinophils Absolute: 0.1 10*3/uL (ref 0.0–0.5)
Eosinophils Relative: 1 %
HCT: 32.3 % — ABNORMAL LOW (ref 39.0–52.0)
Hemoglobin: 10.5 g/dL — ABNORMAL LOW (ref 13.0–17.0)
Immature Granulocytes: 1 %
Lymphocytes Relative: 13 %
Lymphs Abs: 1.6 10*3/uL (ref 0.7–4.0)
MCH: 30.6 pg (ref 26.0–34.0)
MCHC: 32.5 g/dL (ref 30.0–36.0)
MCV: 94.2 fL (ref 80.0–100.0)
Monocytes Absolute: 1.1 10*3/uL — ABNORMAL HIGH (ref 0.1–1.0)
Monocytes Relative: 9 %
Neutro Abs: 9.5 10*3/uL — ABNORMAL HIGH (ref 1.7–7.7)
Neutrophils Relative %: 76 %
Platelets: 260 10*3/uL (ref 150–400)
RBC: 3.43 MIL/uL — ABNORMAL LOW (ref 4.22–5.81)
RDW: 14.4 % (ref 11.5–15.5)
WBC: 12.4 10*3/uL — ABNORMAL HIGH (ref 4.0–10.5)
nRBC: 0 % (ref 0.0–0.2)

## 2020-08-07 LAB — CULTURE, BLOOD (ROUTINE X 2)
Special Requests: ADEQUATE
Special Requests: ADEQUATE

## 2020-08-07 LAB — MRSA NEXT GEN BY PCR, NASAL: MRSA by PCR Next Gen: NOT DETECTED

## 2020-08-07 LAB — PHOSPHORUS: Phosphorus: 1.3 mg/dL — ABNORMAL LOW (ref 2.5–4.6)

## 2020-08-07 LAB — TROPONIN I (HIGH SENSITIVITY)
Troponin I (High Sensitivity): 12 ng/L (ref <18)
Troponin I (High Sensitivity): 12 ng/L (ref <18)

## 2020-08-07 LAB — GLUCOSE, CAPILLARY: Glucose-Capillary: 150 mg/dL — ABNORMAL HIGH (ref 70–99)

## 2020-08-07 LAB — BRAIN NATRIURETIC PEPTIDE: B Natriuretic Peptide: 371.1 pg/mL — ABNORMAL HIGH (ref 0.0–100.0)

## 2020-08-07 MED ORDER — AMOXICILLIN-POT CLAVULANATE 875-125 MG PO TABS
1.0000 | ORAL_TABLET | Freq: Two times a day (BID) | ORAL | Status: DC
  Administered 2020-08-07 – 2020-08-08 (×2): 1 via ORAL
  Filled 2020-08-07 (×2): qty 1

## 2020-08-07 MED ORDER — SODIUM CHLORIDE 0.9 % IV SOLN: INTRAVENOUS | Status: DC

## 2020-08-07 MED ORDER — CHLORHEXIDINE GLUCONATE CLOTH 2 % EX PADS
6.0000 | MEDICATED_PAD | Freq: Every day | CUTANEOUS | Status: DC
  Administered 2020-08-07 – 2020-08-14 (×6): 6 via TOPICAL

## 2020-08-07 MED ORDER — METOPROLOL TARTRATE 5 MG/5ML IV SOLN
5.0000 mg | INTRAVENOUS | Status: AC
  Administered 2020-08-07: 5 mg via INTRAVENOUS
  Filled 2020-08-07: qty 5

## 2020-08-07 MED ORDER — POTASSIUM PHOSPHATES 15 MMOLE/5ML IV SOLN
30.0000 mmol | Freq: Once | INTRAVENOUS | Status: AC
  Administered 2020-08-07: 30 mmol via INTRAVENOUS
  Filled 2020-08-07: qty 10

## 2020-08-07 MED ORDER — MAGNESIUM SULFATE 4 GM/100ML IV SOLN: 4.0000 g | Freq: Once | INTRAVENOUS | Status: DC

## 2020-08-07 MED ORDER — ORAL CARE MOUTH RINSE
15.0000 mL | Freq: Two times a day (BID) | OROMUCOSAL | Status: DC
  Administered 2020-08-07 – 2020-08-16 (×18): 15 mL via OROMUCOSAL

## 2020-08-07 MED ORDER — HYDRALAZINE HCL 20 MG/ML IJ SOLN
10.0000 mg | Freq: Four times a day (QID) | INTRAMUSCULAR | Status: DC | PRN
  Administered 2020-08-07 – 2020-08-08 (×3): 10 mg via INTRAVENOUS
  Filled 2020-08-07 (×3): qty 1

## 2020-08-07 MED ORDER — MELATONIN 3 MG PO TABS
3.0000 mg | ORAL_TABLET | Freq: Once | ORAL | Status: AC
  Administered 2020-08-11: 3 mg via ORAL
  Filled 2020-08-07 (×2): qty 1

## 2020-08-07 MED ORDER — POTASSIUM CHLORIDE CRYS ER 10 MEQ PO TBCR: 40.0000 meq | EXTENDED_RELEASE_TABLET | Freq: Two times a day (BID) | ORAL | Status: DC

## 2020-08-07 MED ORDER — DILTIAZEM HCL-DEXTROSE 125-5 MG/125ML-% IV SOLN (PREMIX)
2.5000 mg/h | INTRAVENOUS | Status: DC
  Administered 2020-08-07: 5 mg/h via INTRAVENOUS
  Administered 2020-08-08: 15 mg/h via INTRAVENOUS
  Administered 2020-08-09: 10 mg/h via INTRAVENOUS
  Administered 2020-08-10: 5 mg/h via INTRAVENOUS
  Administered 2020-08-11: 7.5 mg/h via INTRAVENOUS
  Administered 2020-08-12 – 2020-08-15 (×4): 5 mg/h via INTRAVENOUS
  Filled 2020-08-07 (×13): qty 125

## 2020-08-07 MED ORDER — FUROSEMIDE 10 MG/ML IJ SOLN
60.0000 mg | Freq: Once | INTRAMUSCULAR | Status: AC
  Administered 2020-08-07: 60 mg via INTRAVENOUS
  Filled 2020-08-07: qty 6

## 2020-08-07 MED ORDER — METOPROLOL TARTRATE 5 MG/5ML IV SOLN
5.0000 mg | Freq: Four times a day (QID) | INTRAVENOUS | Status: DC | PRN
  Administered 2020-08-07 (×2): 5 mg via INTRAVENOUS
  Filled 2020-08-07 (×2): qty 5

## 2020-08-07 MED ORDER — POTASSIUM CHLORIDE CRYS ER 10 MEQ PO TBCR
40.0000 meq | EXTENDED_RELEASE_TABLET | Freq: Once | ORAL | Status: AC
  Administered 2020-08-07: 40 meq via ORAL
  Filled 2020-08-07: qty 4

## 2020-08-07 MED ORDER — FUROSEMIDE 10 MG/ML IJ SOLN
40.0000 mg | Freq: Two times a day (BID) | INTRAMUSCULAR | Status: AC
  Administered 2020-08-08 (×2): 40 mg via INTRAVENOUS
  Filled 2020-08-07 (×2): qty 4

## 2020-08-07 NOTE — Progress Notes (Signed)
    OVERNIGHT PROGRESS REPORT  Notified by RN for rapid response. Patient had rales bilaterally and labs and EKG were completed.   Patient had been ordered lasix at that time and has an initial output of 1700cc as of the time of this note. Afib is known and a HR of 150s is reported by the assigned RN. EKG from the time of rapid response is available Labs yield a BNP of 371.1 others are pending.  Patient is on Cardizem drip (titrated- currently at '5mg'$  hr at this time) with a HR now at 110 bpm +/-. He is able to rest at this time  SBP has come down to 160s SPO2 is 97% on 3L [place] at a RR of 25  Cardiology is consulted for the AM and have replied.    Gershon Cull MSNA MSN [facility] Acute Care Nurse Practitioner [place]

## 2020-08-07 NOTE — Significant Event (Signed)
Rapid Response Event Note   Reason for Call :  Afib/RVR & short of breath, especially with any exertion.   Initial Focused Assessment:  EKG shows same as telemetry with HR 125, Patient is short of breath, lungs clear upper lobes, diminished lower; pain 0 at this moment, but states abdomen does hurt intermittently. Abdomen distended with active bowel sounds. Wife and daughter at bedside. Patient has known history of Afib and recent infection and cancer diagnosis. Metoprolol has not been effective for rate control. Currently, no IV fluids infusing, but has had significant fluids administered this admission.   Interventions:  EKG, CXR, labs ordered and done. CBG normal; Communicating with on call provider via secure chat. Hydralazine given for hypertension. Metoprolol 5 mg IV given just prior to RR call.   Plan of Care:  Move to [place] room 1241 for closer monitoring, start Cardizem drip for HR control, Cardiology consult, and Lasix ordered by provider for pleural effusion seen on CXR. Educated patient, wife, and daughter about plan of care. Allowed wife and daughter to stay only until patient able to be settled. Placed condom catheter on patient to facilitate I/O and limit exertional distress. Report given to [facility], RN at bedside.   Event Summary:   MD Notified: 1951 Call Time: [place] Time: 1945 End Time: 2054  Selinda Michaels, RN

## 2020-08-07 NOTE — Progress Notes (Signed)
During shift report, assessed pt who presents DOE will eating dinner and stated he feels SOB with crackles while on 1 L [place]. Pt AOx4  with HR 140s-160s and hypertensive. Rapid response was called. EKG and CXR obtained as well as metoprolol 5 mg IVP given. Charge RN, rapid response, and AC at bedside. [facility] on call was paged at 59.

## 2020-08-07 NOTE — Progress Notes (Addendum)
PROGRESS NOTE    Barry Contreras.  ALP:[id number] DOB: Jun 18, 1931 DOA: 08/04/2020 PCP: Lajean Manes, MD   Brief Narrative:  The patient is an 87 year old Caucasian male with a past medical history significant for but not limited to pancreatic cancer with recent systemic chemotherapy, history of obstructive jaundice status post biliary stent placement, proximal atrial fibrillation on anticoagulant with Eliquis, hypertension, prediabetes as well as other comorbidities who presented with a chief complaint of fever, nausea, vomiting.  Patient daughter was at bedside when he came in and he was having chills and rigors.  Daughter just come into town to visit and states that yesterday began to develop fevers or chills.  The patient had 1 episode of nausea vomiting but no diarrhea.  No chest pain.  He has a history of obstructive jaundice and is status post biliary stent placement with [facility] in 07/04/2020.  In the ED is found to be tachycardic and tachypneic with a lactic acid of 3.4 and abnormal LFTs.  He underwent a CT of the abdomen and pelvis which showed pneumobilia with slight gallbladder wall enhancement and wall thickening.  A dedicated right upper quadrant ultrasound showed gallbladder wall thickening with no visible stone.  The ED discussed with Dr. Michail Sermon who recommended the patient to be kept n.p.o. and continue antibiotics.  General surgery was also consulted and recommended the GI evaluation and he started on IV cefepime and Flagyl and this has now been de-escalated to IV ceftriaxone given that further work-up is revealed that he got a Klebsiella bacteremia.  He was placed on a clear liquid diet by GI and was advanced to full liquid diet yesterday.  GI recommending continue IV antibiotics for now and deferring to oncology whether port should be placed with his bacteremia and Dr. Michail Sermon is to discuss with Dr. Watt Climes on the patency of the biliary stent.  Diet was further advanced to a soft diet  yesterday.  This morning he was in A. fib with RVR with uncontrolled heart rates he was given a dose of 5 metoprolol which improved his heart rates.  We will change to oral regimen based on the sensitivities of [name].  Oncology recommending delaying Port-A-Cath placement until he has completed at least a week of antibiotics with repeat negative blood cultures.  Klebsiella pneumoniae is pansensitive now and only resistant to ampicillin so will discuss with ID about transitioning to p.o and they are recommending treating with Augmenting 875 mg po BID for total Antibiotic course being a total of 10 days.  Assessment & Plan:   Principal Problem:   Sepsis ([place]) Active Problems:   Hypertension   Paroxysmal atrial fibrillation (HCC)   Cancer of head of pancreas (HCC)   Elevated LFTs   Serum total bilirubin elevated   Bacteremia  Sepsis secondary to intra-abdominal infection and Klebsiella bacteremia, present admission -Patient met sepsis criteria as he is febrile, tachycardic and had a leukocytosis and a: Sepsis physiology is now improving -He is status post biliary stent placement on 06/26/2020 for obstructive jaundice and right upper quadrant ultrasound showed pneumobilia -GI consulted and recommending continue IV antibiotics keeping the patient n.p.o initially but further advance diet now he is on a soft diet -He started on IV cefepime and Flagyl but this has been de-escalated to IV ceftriaxone given that he is a Klebsiella pneumoniae with Sensitivities showing pan sensitivity except to ampicillin; after further discussion with ID Dr. Juleen China will change the patient to Augmentin 875 mg p.o. twice daily  and treat for total of 10 days -He received 2 L of IV fluid in the ED and he is currently getting IV fluids at 150 cc/h -Lactic acid level was elevated and trending down at 2.3; on presentation was 5.3 -WBC went from 17.2 and is now improving slowly and is now 12.4 -GI recommending  discontinuing antibiotics and supportive care -Diet has been advanced to a soft diet by gastroenterology and Dr. Michail Sermon did discuss with Dr. Watt Climes about the patency of his biliary stent  Abnormal LFTs/elevated LFTs, improving  -In the setting of intrahepatic infection from biliary stent -Patient has CT of the abdomen pelvis as well as right upper quadrant ultrasound -AST went from 192 is now trended down to 27 -ALT went from 220 and is now trended down to 79 -Acute hepatitis panel was negative -He also has metastatic liver disease and initially planned to have a liver biopsy on 07/20/2020 but this was aborted suspicious for not well seen on ultrasound  -Has been consulted in the feel that the metal biliary stent remains patent and they do not think that repeat ERCP and additional stent placement is needed at this time  Hyperbilirubinemia -Has been chronically elevated in the setting of his obstructive jaundice and is now gone from 2.4 -> 2.3 -> 1.6 -> 1.3 -Continue monitor and trend and repeat CMP in a.m.  History of pancreatic cancer suspected mets to liver -Recently underwent chemotherapy last week -Follows with Dr. Benay Spice who will be consulted and added to the care teams -Patient has a Port-A-Cath placement for systemic chemotherapy scheduled for August -Will have Dr. Hillery Aldo weighed in and he recommends deferring Port-A-Cath placement for at least a week until after is completed antibiotics with repeat negative blood cultures  Metabolic Acidosis -Mild and now improved -Patient's CO2 is now 23, anion gap is 6, chloride level is 107 -C/w NS at 75 mL/hr and Stop later today given that he is eating -Continue to monitor and trend and repeat CMP in a.m.  Hypokalemia -Patient's K+ was 3.4 -Replete with IV K Phos 30 mmol and po Kcl 40 mEQ x1 -Continue to Monitor and Replete as Necessary -Mag Level was stable at 2.1 -Repeat CMP in the AM  Hypophosphatemia -Patient's Phos level was  1.3 -Replete with IV K Phos 30 mmol -Continue to Monitor and Replete as Necessary -Repeat CMP in the AM  Proximal Atrial Fibrillation with RVR -Currently patient is an A. Fib but HR were elevated today and he was in RVR  -Anticoagulation with apixaban -Continue with diltiazem 120 once p.o. daily -Given IV Metoprolol 5 mg x1 with Improvement in his HR -Continue telemetry monitoring -HR not as Tachycardic and in A Fib but not better controlled   Normocytic Anemia  -Likely dilutional drop -Patient's hemoglobin/hematocrit on admission we will 12.6/39.5 and trended down and is now 10.5/32.3 -Check anemia panel and showed an iron level of 17, U IBC of 224, TIBC of 241, saturation ratios of 7%, ferritin level 333, folate level 14.1, and vitamin B12 level 1477 -continue monitor for signs or symptoms of bleeding; currently no overt bleeding noted -Repeat CBC in a.m.  DVT prophylaxis: Anticoagulated with apixaban Code Status: FULL CODE Family Communication: No family at Bedside  Disposition Plan: Pending further clinical improvement and clearance by the specialist Status is: Inpatient  Remains inpatient appropriate because:IV treatments appropriate due to intensity of illness or inability to take PO and Inpatient level of care appropriate due to severity of illness  Dispo: The  patient is from: Home              Anticipated d/c is to:  TBD              Patient currently is not medically stable to d/c.   Difficult to place patient No  Consultants:  Gastroenterology   Procedures: None  Antimicrobials:  Anti-infectives (From admission, onward)    Start     Dose/Rate Route Frequency Ordered Stop   08/05/20 2200  metroNIDAZOLE (FLAGYL) IVPB 500 mg        500 mg 100 mL/hr over 60 Minutes Intravenous Every 12 hours 08/05/20 1347     08/05/20 2000  cefTRIAXone (ROCEPHIN) 2 g in sodium chloride 0.9 % 100 mL IVPB        2 g 200 mL/hr over 30 Minutes Intravenous Every 24 hours 08/05/20 1347      08/05/20 0800  ceFEPIme (MAXIPIME) 2 g in sodium chloride 0.9 % 100 mL IVPB  Status:  Discontinued        2 g 200 mL/hr over 30 Minutes Intravenous Every 12 hours 08/04/20 2218 08/05/20 1347   08/04/20 2200  metroNIDAZOLE (FLAGYL) IVPB 500 mg  Status:  Discontinued        500 mg 100 mL/hr over 60 Minutes Intravenous Every 8 hours 08/04/20 2158 08/05/20 1347   08/04/20 1900  ceFEPIme (MAXIPIME) 2 g in sodium chloride 0.9 % 100 mL IVPB        2 g 200 mL/hr over 30 Minutes Intravenous  Once 08/04/20 1858 08/04/20 2046   08/04/20 1900  metroNIDAZOLE (FLAGYL) IVPB 500 mg        500 mg 100 mL/hr over 60 Minutes Intravenous  Once 08/04/20 1858 08/04/20 2046        Subjective: Seen and examined at bedside and he was doing okay this morning but then states he has had some abdominal discomfort and had to have 2 bowel movements after the oncologist palpated his abdomen.  Diet was advanced to soft diet yesterday.  Was in A. fib with RVR this morning but not very symptomatic.  Denies any chest pain or shortness of breath.  Feels a little bit better and his lab work is improving.  No other concerns or complaints at this time.  Objective: Vitals:   08/07/20 0953 08/07/20 1000 08/07/20 1152 08/07/20 1400  BP: (!) 173/98 (!) 158/104 (!) 150/91 (!) 175/111  Pulse: (!) 116 (!) 119 90 93  Resp:  (!) 22 18   Temp:  98.1 F (36.7 C) 98.2 F (36.8 C) 97.6 F (36.4 C)  TempSrc:  Oral Oral Oral  SpO2:  95% 95% 92%  Weight:      Height:        Intake/Output Summary (Last 24 hours) at 08/07/2020 1551 Last data filed at 08/07/2020 1503 Gross per 24 hour  Intake 2985.83 ml  Output 800 ml  Net 2185.83 ml    Filed Weights   08/04/20 1608  Weight: 73.5 kg   Examination: Physical Exam:  Constitutional: WN/WD overweight elderly Caucasian male, no acute distress appears slightly uncomfortable Eyes: Lids and conjunctivae normal, sclerae anicteric  ENMT: External Ears, Nose appear normal. Grossly  normal hearing.  Neck: Appears normal, supple, no cervical masses, normal ROM, no appreciable thyromegaly; no JVD Respiratory: Diminished to auscultation bilaterally with coarse breath sounds, no wheezing, rales, rhonchi or crackles. Normal respiratory effort and patient is not tachypenic. No accessory muscle use.  Unlabored breathing Cardiovascular: Irregularly irregular and  tachycardic, no murmurs / rubs / gallops. S1 and S2 auscultated.  Trace extremity edema   Abdomen: Soft, mildly-tender, distended secondary to body habitus. Bowel sounds positive.  GU: Deferred. Musculoskeletal: No clubbing / cyanosis of digits/nails. No joint deformity upper and lower extremities.  Skin: No rashes, lesions, ulcers on limited skin evaluation. No induration; Warm and dry.  Neurologic: CN 2-12 grossly intact with no focal deficits. Romberg sign and cerebellar reflexes not assessed.  Psychiatric: Normal judgment and insight. Alert and oriented x 3. Normal mood and appropriate affect.   Data Reviewed: I have personally reviewed following labs and imaging studies  CBC: Recent Labs  Lab 08/04/20 1704 08/05/20 0429 08/06/20 0651 08/07/20 0458  WBC 17.2* 14.7* 14.6* 12.4*  NEUTROABS 15.0*  --  12.1* 9.5*  HGB 12.6* 10.0* 11.0* 10.5*  HCT 39.5 30.9* 34.8* 32.3*  MCV 94.3 93.9 94.3 94.2  PLT 297 218 253 161    Basic Metabolic Panel: Recent Labs  Lab 08/04/20 1704 08/05/20 0429 08/06/20 0651 08/07/20 0458  NA 136 136 136 136  K 4.0 3.5 3.3* 3.4*  CL 100 107 107 107  CO2 24 21* 20* 23  GLUCOSE 142* 148* 131* 123*  BUN '15 15 13 10  ' CREATININE 0.89 0.67 0.71 0.60*  CALCIUM 9.6 8.0* 8.0* 8.1*  MG  --   --  1.9 2.1  PHOS  --   --  1.6* 1.3*    GFR: Estimated Creatinine Clearance: 58.3 mL/min (A) (by C-G formula based on SCr of 0.6 mg/dL (L)). Liver Function Tests: Recent Labs  Lab 08/04/20 1704 08/05/20 0429 08/06/20 0651 08/07/20 0458  AST 192* 95* 49* 27  ALT 220* 151* 111* 79*   ALKPHOS 263* 203* 203* 182*  BILITOT 2.4* 2.3* 1.6* 1.3*  PROT 7.1 5.8* 6.4* 6.3*  ALBUMIN 3.7 2.9* 3.0* 3.1*    Recent Labs  Lab 08/04/20 1704  LIPASE 36    No results for input(s): AMMONIA in the last 168 hours. Coagulation Profile: Recent Labs  Lab 08/04/20 1858  INR 1.4*    Cardiac Enzymes: No results for input(s): CKTOTAL, CKMB, CKMBINDEX, TROPONINI in the last 168 hours. BNP (last 3 results) No results for input(s): PROBNP in the last 8760 hours. HbA1C: No results for input(s): HGBA1C in the last 72 hours. CBG: No results for input(s): GLUCAP in the last 168 hours. Lipid Profile: No results for input(s): CHOL, HDL, LDLCALC, TRIG, CHOLHDL, LDLDIRECT in the last 72 hours. Thyroid Function Tests: No results for input(s): TSH, T4TOTAL, FREET4, T3FREE, THYROIDAB in the last 72 hours. Anemia Panel: Recent Labs    08/06/20 0651  VITAMINB12 1,477*  FOLATE 14.1  FERRITIN 333  TIBC 241*  IRON 17*  RETICCTPCT 1.4    Sepsis Labs: Recent Labs  Lab 08/04/20 1704 08/04/20 1826 08/04/20 2200 08/05/20 0100  LATICACIDVEN 3.4* 2.5* 5.3* 2.3*     Recent Results (from the past 240 hour(s))  Blood Culture (routine x 2)     Status: Abnormal   Collection Time: 08/04/20  7:18 PM   Specimen: BLOOD  Result Value Ref Range Status   Specimen Description   Final    BLOOD RIGHT ANTECUBITAL Performed at [facility] [address], [place], [place] [address]    Special Requests   Final    BOTTLES DRAWN AEROBIC AND ANAEROBIC Blood Culture adequate volume Performed at [facility] [address] [place], [place] [address]    Culture  Setup Time   Final  GRAM NEGATIVE RODS IN BOTH AEROBIC AND ANAEROBIC BOTTLES CRITICAL VALUE NOTED.  VALUE IS CONSISTENT WITH PREVIOUSLY REPORTED AND CALLED VALUE.    Culture (A)  Final    KLEBSIELLA PNEUMONIAE SUSCEPTIBILITIES PERFORMED ON PREVIOUS CULTURE WITHIN THE LAST 5 DAYS. Performed at  [facility], [place] [address], [place], [place] [address]    Report Status 08/07/2020 FINAL  Final  Blood Culture (routine x 2)     Status: Abnormal   Collection Time: 08/04/20  7:25 PM   Specimen: BLOOD  Result Value Ref Range Status   Specimen Description   Final    BLOOD WRIST LEFT Performed at [facility] [address] [place], [place] [address]    Special Requests   Final    BOTTLES DRAWN AEROBIC AND ANAEROBIC Blood Culture adequate volume Performed at [facility] [address], [place], [place] [address]    Culture  Setup Time   Final    GRAM NEGATIVE RODS IN BOTH AEROBIC AND ANAEROBIC BOTTLES CRITICAL RESULT CALLED TO, READ BACK BY AND VERIFIED WITH: CHRISTINE SHADE PHARMD '@1249'  08/05/20 EB Performed at [facility], [place] [address], [place], [place] [address]    Culture KLEBSIELLA PNEUMONIAE (A)  Final   Report Status 08/07/2020 FINAL  Final   Organism ID, Bacteria KLEBSIELLA PNEUMONIAE  Final      Susceptibility   Klebsiella pneumoniae - MIC*    AMPICILLIN RESISTANT Resistant     CEFAZOLIN <=4 SENSITIVE Sensitive     CEFEPIME <=0.12 SENSITIVE Sensitive     CEFTAZIDIME <=1 SENSITIVE Sensitive     CEFTRIAXONE <=0.25 SENSITIVE Sensitive     CIPROFLOXACIN <=0.25 SENSITIVE Sensitive     GENTAMICIN <=1 SENSITIVE Sensitive     IMIPENEM <=0.25 SENSITIVE Sensitive     TRIMETH/SULFA <=20 SENSITIVE Sensitive     AMPICILLIN/SULBACTAM 4 SENSITIVE Sensitive     PIP/TAZO <=4 SENSITIVE Sensitive     * KLEBSIELLA PNEUMONIAE  Blood Culture ID Panel (Reflexed)     Status: Abnormal   Collection Time: 08/04/20  7:25 PM  Result Value Ref Range Status   Enterococcus faecalis NOT DETECTED NOT DETECTED Final   Enterococcus Faecium NOT DETECTED NOT DETECTED Final   Listeria monocytogenes NOT DETECTED NOT DETECTED Final   Staphylococcus species NOT DETECTED NOT DETECTED Final   Staphylococcus aureus (BCID) NOT DETECTED NOT DETECTED  Final   Staphylococcus epidermidis NOT DETECTED NOT DETECTED Final   Staphylococcus lugdunensis NOT DETECTED NOT DETECTED Final   Streptococcus species NOT DETECTED NOT DETECTED Final   Streptococcus agalactiae NOT DETECTED NOT DETECTED Final   Streptococcus pneumoniae NOT DETECTED NOT DETECTED Final   Streptococcus pyogenes NOT DETECTED NOT DETECTED Final   A.calcoaceticus-baumannii NOT DETECTED NOT DETECTED Final   Bacteroides fragilis NOT DETECTED NOT DETECTED Final   Enterobacterales DETECTED (A) NOT DETECTED Final    Comment: Enterobacterales represent a large order of gram negative bacteria, not a single organism. CRITICAL RESULT CALLED TO, READ BACK BY AND VERIFIED WITH: [place] '@1249'  08/05/20 EB    Enterobacter cloacae complex NOT DETECTED NOT DETECTED Final   Escherichia coli NOT DETECTED NOT DETECTED Final   Klebsiella aerogenes NOT DETECTED NOT DETECTED Final   Klebsiella oxytoca NOT DETECTED NOT DETECTED Final   Klebsiella pneumoniae DETECTED (A) NOT DETECTED Final    Comment: CRITICAL RESULT CALLED TO, READ BACK BY AND VERIFIED WITH: CHRISTINE SHADE PHARMD '@1249'  08/05/20 EB    Proteus species NOT DETECTED NOT DETECTED Final  Salmonella species NOT DETECTED NOT DETECTED Final   Serratia marcescens NOT DETECTED NOT DETECTED Final   Haemophilus influenzae NOT DETECTED NOT DETECTED Final   Neisseria meningitidis NOT DETECTED NOT DETECTED Final   Pseudomonas aeruginosa NOT DETECTED NOT DETECTED Final   Stenotrophomonas maltophilia NOT DETECTED NOT DETECTED Final   Candida albicans NOT DETECTED NOT DETECTED Final   Candida auris NOT DETECTED NOT DETECTED Final   Candida glabrata NOT DETECTED NOT DETECTED Final   Candida krusei NOT DETECTED NOT DETECTED Final   Candida parapsilosis NOT DETECTED NOT DETECTED Final   Candida tropicalis NOT DETECTED NOT DETECTED Final   Cryptococcus neoformans/gattii NOT DETECTED NOT DETECTED Final   CTX-M ESBL NOT DETECTED NOT  DETECTED Final   Carbapenem resistance IMP NOT DETECTED NOT DETECTED Final   Carbapenem resistance KPC NOT DETECTED NOT DETECTED Final   Carbapenem resistance NDM NOT DETECTED NOT DETECTED Final   Carbapenem resist OXA 48 LIKE NOT DETECTED NOT DETECTED Final   Carbapenem resistance VIM NOT DETECTED NOT DETECTED Final    Comment: Performed at [facility], [address], [place], [place] [address]  Resp Panel by RT-PCR (Flu A&B, Covid) Nasopharyngeal Swab     Status: None   Collection Time: 08/04/20  7:45 PM   Specimen: Nasopharyngeal Swab; Nasopharyngeal(NP) swabs in vial transport medium  Result Value Ref Range Status   SARS Coronavirus 2 by RT PCR NEGATIVE NEGATIVE Final    Comment: (NOTE) SARS-CoV-2 target nucleic acids are NOT DETECTED.  The SARS-CoV-2 RNA is generally detectable in upper respiratory specimens during the acute phase of infection. The lowest concentration of SARS-CoV-2 viral copies this assay can detect is 138 copies/mL. A negative result does not preclude SARS-Cov-2 infection and should not be used as the sole basis for treatment or other patient management decisions. A negative result may occur with  improper specimen collection/handling, submission of specimen other than nasopharyngeal swab, presence of viral mutation(s) within the areas targeted by this assay, and inadequate number of viral copies(<138 copies/mL). A negative result must be combined with clinical observations, patient history, and epidemiological information. The expected result is Negative.  Fact Sheet for Patients:  EntrepreneurPulse.com.au  Fact Sheet for Healthcare Providers:  IncredibleEmployment.be  This test is no t yet approved or cleared by the [place] FDA and  has been authorized for detection and/or diagnosis of SARS-CoV-2 by FDA under an Emergency Use Authorization (EUA). This EUA will remain  in effect (meaning this test can be  used) for the duration of the COVID-19 declaration under Section 564(b)(1) of the Act, 21 U.S.C.section 360bbb-3(b)(1), unless the authorization is terminated  or revoked sooner.       Influenza A by PCR NEGATIVE NEGATIVE Final   Influenza B by PCR NEGATIVE NEGATIVE Final    Comment: (NOTE) The Xpert Xpress SARS-CoV-2/FLU/RSV plus assay is intended as an aid in the diagnosis of influenza from Nasopharyngeal swab specimens and should not be used as a sole basis for treatment. Nasal washings and aspirates are unacceptable for Xpert Xpress SARS-CoV-2/FLU/RSV testing.  Fact Sheet for Patients: EntrepreneurPulse.com.au  Fact Sheet for Healthcare Providers: IncredibleEmployment.be  This test is not yet approved or cleared by the [place] FDA and has been authorized for detection and/or diagnosis of SARS-CoV-2 by FDA under an Emergency Use Authorization (EUA). This EUA will remain in effect (meaning this test can be used) for the duration of the COVID-19 declaration under Section 564(b)(1) of the Act, 21 U.S.C. section 360bbb-3(b)(1), unless the  authorization is terminated or revoked.  Performed at [facility], [place] [address] [place], [place] [address]   C Difficile Quick Screen w PCR reflex     Status: None   Collection Time: 08/06/20  1:41 PM   Specimen: STOOL  Result Value Ref Range Status   C Diff antigen NEGATIVE NEGATIVE Final   C Diff toxin NEGATIVE NEGATIVE Final   C Diff interpretation NEGATIVE  Final    Comment: Performed at [facility], [place] [address], [place], [place] [address]      RN Pressure Injury Documentation:     Estimated body mass index is 29.63 kg/m as calculated from the following:   Height as of this encounter: '5\' 2"'  (1.575 m).   Weight as of this encounter: 73.5 kg.  Malnutrition Type:   Malnutrition Characteristics:   Nutrition Interventions:     Radiology  Studies: No results found.  Scheduled Meds:  apixaban  5 mg Oral BID   diltiazem  120 mg Oral Daily   metoprolol tartrate  50 mg Oral BID   Continuous Infusions:  sodium chloride 75 mL/hr at 08/06/20 0854   cefTRIAXone (ROCEPHIN)  IV 2 g (08/06/20 2141)   metronidazole 500 mg (08/07/20 0957)   potassium PHOSPHATE IVPB (in mmol) 30 mmol (08/07/20 1136)    LOS: 2 days   Barry Elbe, DO [facility] PAGER is on AMION  If 7PM-7AM, please contact night-coverage [email or web address]

## 2020-08-07 NOTE — Progress Notes (Signed)
Pt given prn metropolol at 1106am. Vitals every 2hrs and 4hrs. Will continue monitor the pt.

## 2020-08-07 NOTE — Progress Notes (Signed)
   08/07/20 1000  Assess: MEWS Score  Temp 98.1 F (36.7 C)  BP (!) 158/104  Pulse Rate (!) 119  Resp (!) 22  SpO2 95 %  Assess: MEWS Score  MEWS Temp 0  MEWS Systolic 0  MEWS Pulse 2  MEWS RR 1  MEWS LOC 0  MEWS Score 3  MEWS Score Color Yellow  Assess: if the MEWS score is Yellow or Red  Were vital signs taken at a resting state? Yes  Focused Assessment No change from prior assessment  Does the patient meet 2 or more of the SIRS criteria? Yes  Does the patient have a confirmed or suspected source of infection? Yes  Provider and Rapid Response Notified? Yes  MEWS guidelines implemented *See Row Information* Yes  Treat  Pain Scale 0-10  Pain Score 7  Pain Type Chronic pain  Pain Location Abdomen  Pain Orientation Right;Lower;Medial;Upper  Pain Onset Gradual  Patients Stated Pain Goal 2  Pain Intervention(s) Medication (See eMAR)  Interventions Medication (see MAR)  Take Vital Signs  Increase Vital Sign Frequency  Yellow: Q 2hr X 2 then Q 4hr X 2, if remains yellow, continue Q 4hrs  Escalate  MEWS: Escalate Yellow: discuss with charge nurse/RN and consider discussing with provider and RRT  Notify: Charge Nurse/RN  Name of Charge Nurse/RN Notified kisha,RN  Date Charge Nurse/RN Notified 08/07/20  Time Charge Nurse/RN Notified 44  Notify: Provider  Provider Name/Title Omair,DO  Date Provider Notified 08/07/20  Time Provider Notified 1015  Notification Type Page  Notification Reason Critical result  Provider response In department  Date of Provider Response 08/07/20  Notify: Rapid Response  Name of Rapid Response RN Notified Christian  Date Rapid Response Notified 08/07/20  Time Rapid Response Notified 1030  Document  Patient Outcome Stabilized after interventions  Progress note created (see row info) Yes  Assess: SIRS CRITERIA  SIRS Temperature  0  SIRS Pulse 1  SIRS Respirations  1  SIRS WBC 1  SIRS Score Sum  3

## 2020-08-07 NOTE — Progress Notes (Addendum)
Pt blood pressure was 158/138mhg at 10am. Gave his schedule medication the patient HR 119 GONE UP 142 . Initiated the yellow mews. EKG taken. Pt is asymptomatic Notified provider. Notified the charge nurse.

## 2020-08-07 NOTE — Progress Notes (Signed)
[facility] Gastroenterology Progress Note  Barry Contreras. 87 y.o. 27-Jul-1931   Subjective: Feels bloated and reports coughing that he attributes to that. Small stools today. Family in room.  Objective: Vital signs: Vitals:   08/07/20 1152 08/07/20 1400  BP: (!) 150/91 (!) 175/111  Pulse: 90 93  Resp: 18   Temp: 98.2 F (36.8 C) 97.6 F (36.4 C)  SpO2: 95% 92%    Physical Exam: Gen: lethargic, no acute distress  HEENT: anicteric sclera CV: RRR Chest: CTA B Abd: epigastric and RUQ tenderness with guarding, soft, nondistended, +BS Ext: no edema  Lab Results: Recent Labs    08/06/20 0651 08/07/20 0458  NA 136 136  K 3.3* 3.4*  CL 107 107  CO2 20* 23  GLUCOSE 131* 123*  BUN 13 10  CREATININE 0.71 0.60*  CALCIUM 8.0* 8.1*  MG 1.9 2.1  PHOS 1.6* 1.3*   Recent Labs    08/06/20 0651 08/07/20 0458  AST 49* 27  ALT 111* 79*  ALKPHOS 203* 182*  BILITOT 1.6* 1.3*  PROT 6.4* 6.3*  ALBUMIN 3.0* 3.1*   Recent Labs    08/06/20 0651 08/07/20 0458  WBC 14.6* 12.4*  NEUTROABS 12.1* 9.5*  HGB 11.0* 10.5*  HCT 34.8* 32.3*  MCV 94.3 94.2  PLT 253 260      Assessment/Plan: Metastatic pancreatic cancer with cholangitis with biliary stent in place. LFTs normalizing. IV Abx being transitioned to Augmentin to complete a 10 day course. If LFTs rise or fever recurs, then will need a repeat ERCP with potential placement of a 2nd stent. Soft diet. Encouraged to ambulate with assistance. Hopefully home in next 1-2 days. F/U with [facility] prn. Will sign off. Call if questions.   [name] 08/07/2020, 5:28 PM  Questions please call [phone] Patient ID: Barry Salib., male   DOB: 1931-09-11, 87 y.o.   MRN: [id number]

## 2020-08-07 NOTE — Progress Notes (Addendum)
IP PROGRESS NOTE  Subjective:   Barry Contreras is known to me with a recent diagnosis of pancreas cancer.  He is scheduled for Port-A-Cath placement this week to be followed by initiation of gemcitabine/Abraxane chemotherapy.  He developed a fever and malaise on 08/04/2020.  He had nausea and vomiting.  The bilirubin and liver enzymes were elevated.  A CT revealed a biliary stent with no biliary dilatation.  The pancreas head mass was again seen.  A new liver lesion suspicious for metastasis.  He was admitted for further evaluation.  Barry Contreras reports feeling better.  He complains abdominal bloating. Objective: Vital signs in last 24 hours: Blood pressure (!) 175/111, pulse 93, temperature 97.6 F (36.4 C), temperature source Oral, resp. rate 18, height '5\' 2"'$  (1.575 m), weight 162 lb (73.5 kg), SpO2 92 %.  Intake/Output from previous day: 07/31 0701 - 08/01 0700 In: [id number] [P.O.:240; I.V.:900; IV Piggyback:506] Out: 800 [Urine:800]  Physical Exam:  HEENT: No thrush, sclera anicteric Lungs: Mild bilateral end expiratory wheeze, no respiratory distress Cardiac: Irregular Abdomen: Mildly distended, no mass, mild tenderness in the mid upper abdomen, no hepatosplenomegaly Extremities: No leg edema    Lab Results: Recent Labs    08/06/20 0651 08/07/20 0458  WBC 14.6* 12.4*  HGB 11.0* 10.5*  HCT 34.8* 32.3*  PLT 253 260    BMET Recent Labs    08/06/20 0651 08/07/20 0458  NA 136 136  K 3.3* 3.4*  CL 107 107  CO2 20* 23  GLUCOSE 131* 123*  BUN 13 10  CREATININE 0.71 0.60*  CALCIUM 8.0* 8.1*    No results found for: CEA1, CEA, [id number], CA125  Studies/Results: No results found.  Medications: I have reviewed the patient's current medications.  Assessment/Plan: Pancreas cancer, clinical stage IV (cT2,cN1,cM1) CT abdomen/pelvis 05/29/2020 pancreas head mass versus focal pancreatitis MRI abdomen 06/17/2020-hypoenhancing mass in the pancreas head, 3.7 x 3 cm, lesion abuts the  anterior surface of the portal vein, subtle 1 cm hypoenhancing segment 6 lesion suspicious for metastasis, no lymphadenopathy, distended gallbladder containing sludge and probable tiny gallstones EUS 07/04/2020, 10 mm malignant appearing peripancreatic lymph node, 35 x 25 mm pancreas head mass, abutment of the portal vein and superior mesenteric vein, no invasion of the SMA or celiac trunk, upstream pancreatic duct dilation, FNA biopsy of the pancreas head mass-adenocarcinoma Ultrasound liver 07/20/2020-liver lesion could not be seen, biopsy procedure aborted CT 08/04/2020-new peripheral right liver lesion, subtle low density lesion in the inferior right liver at the site of a hypoenhancing lesion on MRI, no biliary duct dilatation, pancreas head mass Obstructive jaundice secondary to #1-ERCP 07/04/2020-low to mid duct stricture, uncovered metal stent placed in the common bile duct Atrial fibrillation-maintained on apixaban anticoagulation Glaucoma Aortic aneurysm Admission 08/04/2020 with a fever and hyperbilirubinemia-blood cultures positive for Klebsiella  Barry Contreras is admitted with gram-negative bacteremia in the setting of pancreas cancer and the recently placed biliary stent.  He appears to have developed cholangitis.  His clinical status and liver enzymes have improved.  He will need to complete a course of antibiotics with repeat negative blood cultures prior to undergoing Port-A-Cath placement.  The admission CT is consistent with development of liver metastases.  The plan is to initiate systemic chemotherapy with gemcitabine and Abraxane once he has recovered from the cholangitis.  Recommendations: Continue antibiotics, changed to an oral regimen once the ID/sensitivity on the Klebsiella is available Delay Port-A-Cath until he has completed a week of antibiotics with repeat negative blood cultures  Outpatient follow-up will be scheduled at the Cancer center to begin gemcitabine/Abraxane, after  the Port-A-Cath is placed  I updated his daughter by telephone.   LOS: 2 days   Betsy Coder, MD   08/07/2020, 2:38 PM

## 2020-08-08 ENCOUNTER — Encounter: Payer: Self-pay | Admitting: Cardiology

## 2020-08-08 ENCOUNTER — Inpatient Hospital Stay (HOSPITAL_COMMUNITY): Payer: Medicare PPO

## 2020-08-08 ENCOUNTER — Encounter (HOSPITAL_COMMUNITY): Payer: Self-pay | Admitting: Internal Medicine

## 2020-08-08 DIAGNOSIS — R0609 Other forms of dyspnea: Secondary | ICD-10-CM

## 2020-08-08 DIAGNOSIS — R7989 Other specified abnormal findings of blood chemistry: Secondary | ICD-10-CM | POA: Diagnosis not present

## 2020-08-08 DIAGNOSIS — I4821 Permanent atrial fibrillation: Secondary | ICD-10-CM | POA: Insufficient documentation

## 2020-08-08 DIAGNOSIS — A419 Sepsis, unspecified organism: Secondary | ICD-10-CM | POA: Diagnosis not present

## 2020-08-08 DIAGNOSIS — C250 Malignant neoplasm of head of pancreas: Secondary | ICD-10-CM | POA: Diagnosis not present

## 2020-08-08 DIAGNOSIS — R7881 Bacteremia: Secondary | ICD-10-CM | POA: Diagnosis not present

## 2020-08-08 LAB — ECHOCARDIOGRAM COMPLETE
AR max vel: 2.18 cm2
AV Area VTI: 1.99 cm2
AV Area mean vel: 2.25 cm2
AV Mean grad: 8.3 mmHg
AV Peak grad: 15.7 mmHg
Ao pk vel: 1.98 m/s
Calc EF: 65.6 %
Height: 62 in
MV M vel: 3.94 m/s
MV Peak grad: 62.1 mmHg
P 1/2 time: 339 msec
Radius: 0.4 cm
S' Lateral: 1.8 cm
Single Plane A2C EF: 65.9 %
Single Plane A4C EF: 66 %
Weight: 2592 oz

## 2020-08-08 LAB — COMPREHENSIVE METABOLIC PANEL
ALT: 117 U/L — ABNORMAL HIGH (ref 0–44)
AST: 134 U/L — ABNORMAL HIGH (ref 15–41)
Albumin: 3.3 g/dL — ABNORMAL LOW (ref 3.5–5.0)
Alkaline Phosphatase: 559 U/L — ABNORMAL HIGH (ref 38–126)
Anion gap: 11 (ref 5–15)
BUN: 9 mg/dL (ref 8–23)
CO2: 22 mmol/L (ref 22–32)
Calcium: 8.5 mg/dL — ABNORMAL LOW (ref 8.9–10.3)
Chloride: 102 mmol/L (ref 98–111)
Creatinine, Ser: 0.57 mg/dL — ABNORMAL LOW (ref 0.61–1.24)
GFR, Estimated: >60 mL/min (ref >60)
Glucose, Bld: 142 mg/dL — ABNORMAL HIGH (ref 70–99)
Potassium: 3 mmol/L — ABNORMAL LOW (ref 3.5–5.1)
Sodium: 135 mmol/L (ref 135–145)
Total Bilirubin: 3.9 mg/dL — ABNORMAL HIGH (ref 0.3–1.2)
Total Protein: 6.6 g/dL (ref 6.5–8.1)

## 2020-08-08 LAB — CBC WITH DIFFERENTIAL/PLATELET
Abs Immature Granulocytes: 0.07 10*3/uL (ref 0.00–0.07)
Basophils Absolute: 0 10*3/uL (ref 0.0–0.1)
Basophils Relative: 0 %
Eosinophils Absolute: 0 10*3/uL (ref 0.0–0.5)
Eosinophils Relative: 0 %
HCT: 35.7 % — ABNORMAL LOW (ref 39.0–52.0)
Hemoglobin: 11.8 g/dL — ABNORMAL LOW (ref 13.0–17.0)
Immature Granulocytes: 1 %
Lymphocytes Relative: 11 %
Lymphs Abs: 1.5 10*3/uL (ref 0.7–4.0)
MCH: 29.9 pg (ref 26.0–34.0)
MCHC: 33.1 g/dL (ref 30.0–36.0)
MCV: 90.6 fL (ref 80.0–100.0)
Monocytes Absolute: 1.2 10*3/uL — ABNORMAL HIGH (ref 0.1–1.0)
Monocytes Relative: 9 %
Neutro Abs: 11 10*3/uL — ABNORMAL HIGH (ref 1.7–7.7)
Neutrophils Relative %: 79 %
Platelets: 332 10*3/uL (ref 150–400)
RBC: 3.94 MIL/uL — ABNORMAL LOW (ref 4.22–5.81)
RDW: 14.5 % (ref 11.5–15.5)
WBC: 13.9 10*3/uL — ABNORMAL HIGH (ref 4.0–10.5)
nRBC: 0 % (ref 0.0–0.2)

## 2020-08-08 LAB — PHOSPHORUS: Phosphorus: 2 mg/dL — ABNORMAL LOW (ref 2.5–4.6)

## 2020-08-08 LAB — MAGNESIUM: Magnesium: 2 mg/dL (ref 1.7–2.4)

## 2020-08-08 MED ORDER — POTASSIUM CHLORIDE CRYS ER 20 MEQ PO TBCR
40.0000 meq | EXTENDED_RELEASE_TABLET | Freq: Once | ORAL | Status: AC
  Administered 2020-08-08: 40 meq via ORAL
  Filled 2020-08-08: qty 2

## 2020-08-08 MED ORDER — K PHOS MONO-SOD PHOS DI & MONO 155-852-130 MG PO TABS
500.0000 mg | ORAL_TABLET | Freq: Two times a day (BID) | ORAL | Status: AC
  Administered 2020-08-08 (×2): 500 mg via ORAL
  Filled 2020-08-08 (×2): qty 2

## 2020-08-08 MED ORDER — SODIUM CHLORIDE 0.9 % IV SOLN
2.0000 g | INTRAVENOUS | Status: DC
  Administered 2020-08-08 – 2020-08-09 (×2): 2 g via INTRAVENOUS
  Filled 2020-08-08 (×2): qty 20

## 2020-08-08 MED ORDER — METRONIDAZOLE 500 MG/100ML IV SOLN
500.0000 mg | Freq: Two times a day (BID) | INTRAVENOUS | Status: DC
  Administered 2020-08-08 – 2020-08-10 (×4): 500 mg via INTRAVENOUS
  Filled 2020-08-08 (×4): qty 100

## 2020-08-08 MED ORDER — POTASSIUM CHLORIDE CRYS ER 20 MEQ PO TBCR
40.0000 meq | EXTENDED_RELEASE_TABLET | Freq: Two times a day (BID) | ORAL | Status: AC
  Administered 2020-08-08 (×2): 40 meq via ORAL
  Filled 2020-08-08 (×2): qty 2

## 2020-08-08 NOTE — Progress Notes (Signed)
[facility] Gastroenterology Progress Note  Barry Contreras. 87 y.o. 02/10/1931   Subjective: Feels ok. Denies abdominal pain. Family in room.  Objective: Vital signs: Vitals:   08/08/20 1200 08/08/20 1300  BP: 114/74 (!) 128/92  Pulse: 83 82  Resp: 19 (!) 28  Temp: 97.9 F (36.6 C)   SpO2: 95% 94%    Physical Exam: Gen: elderly, alert, no acute distress  HEENT: anicteric sclera CV: RRR Chest: CTA B Abd: epigastric tenderness with minimal guarding, soft, nondistended, +BS Ext: no edema  Lab Results: Recent Labs    08/07/20 0458 08/08/20 0257  NA 136 135  K 3.4* 3.0*  CL 107 102  CO2 23 22  GLUCOSE 123* 142*  BUN 10 9  CREATININE 0.60* 0.57*  CALCIUM 8.1* 8.5*  MG 2.1 2.0  PHOS 1.3* 2.0*   Recent Labs    08/07/20 0458 08/08/20 0257  AST 27 134*  ALT 79* 117*  ALKPHOS 182* 559*  BILITOT 1.3* 3.9*  PROT 6.3* 6.6  ALBUMIN 3.1* 3.3*   Recent Labs    08/07/20 0458 08/08/20 0257  WBC 12.4* 13.9*  NEUTROABS 9.5* 11.0*  HGB 10.5* 11.8*  HCT 32.3* 35.7*  MCV 94.2 90.6  PLT 260 332      Assessment/Plan: Metastatic pancreatic cancer with cholangitis s/p biliary stent (June) with AFib and RVR yesterday and rise in LFTs. Changed back to IV Abx. I do not think an ERCP is needed at this time and would continue medical management. D/W Dr. Watt Climes. Supportive care. Will follow.   [name] 08/08/2020, 2:19 PM  Questions please call [phone] Patient ID: Barry Wichern., male   DOB: 1931-11-19, 87 y.o.   MRN: [id number]

## 2020-08-08 NOTE — Progress Notes (Signed)
IP PROGRESS NOTE  Subjective:   Mr. Schryver was transferred to the stepdown unit yesterday with rapid atrial fibrillation.  He is anxious to go home. Objective: Vital signs in last 24 hours: Blood pressure (!) 128/92, pulse 82, temperature 97.9 F (36.6 C), temperature source Oral, resp. rate (!) 28, height '5\' 2"'$  (1.575 m), weight 162 lb (73.5 kg), SpO2 94 %.  Intake/Output from previous day: 08/01 0701 - 08/02 0700 In: 1849.7 [I.V.:1292; IV Piggyback:557.7] Out: 3600 [Urine:3600]  Physical Exam:  HEENT: No thrush, sclera anicteric Lungs: Clear bilaterally Cardiac: Irregular, tachycardia Abdomen: Mildly distended, no mass, no hepatomegaly Extremities: No leg edema    Lab Results: Recent Labs    08/07/20 0458 08/08/20 0257  WBC 12.4* 13.9*  HGB 10.5* 11.8*  HCT 32.3* 35.7*  PLT 260 332    BMET Recent Labs    08/07/20 0458 08/08/20 0257  NA 136 135  K 3.4* 3.0*  CL 107 102  CO2 23 22  GLUCOSE 123* 142*  BUN 10 9  CREATININE 0.60* 0.57*  CALCIUM 8.1* 8.5*    No results found for: CEA1, CEA, [id number], CA125  Studies/Results: DG CHEST PORT 1 VIEW  Result Date: 08/08/2020 CLINICAL DATA:  Recent diagnosis of pancreatic cancer. Fever malaise. Nausea and vomiting. EXAM: PORTABLE CHEST 1 VIEW COMPARISON:  Chest x-ray 08/07/2020. FINDINGS: Cardiomegaly with bilateral interstitial prominence in small bilateral pleural effusions again noted. Findings again consistent with CHF. Pneumonitis cannot be excluded. No pneumothorax. IMPRESSION: Cardiomegaly with bilateral interstitial prominence and small bilateral pleural effusions again noted. Findings again consistent with CHF. Pneumonitis cannot be excluded. Similar findings noted on prior study of 08/07/2020. Electronically Signed   By: Marcello Moores  Register   On: 08/08/2020 08:53   DG Chest Port 1 View  Result Date: 08/07/2020 CLINICAL DATA:  Acute respiratory distress. EXAM: PORTABLE CHEST 1 VIEW COMPARISON:  Radiograph  08/04/2020. Lung bases from abdominal CT 08/04/2020. FINDINGS: Increasing hazy opacity at the lung bases consistent with increasing pleural effusions, right greater than left. There is septal thickening and alveolar opacities consistent with pulmonary edema. Mild cardiomegaly with equivocal increase. No pneumothorax or confluent airspace disease. Bilateral glenohumeral arthropathy degenerative change of the shoulders. IMPRESSION: Findings consistent with CHF, new from prior exam. Electronically Signed   By: Keith Rake M.D.   On: 08/07/2020 20:03    Medications: I have reviewed the patient's current medications.  Assessment/Plan: Pancreas cancer, clinical stage IV (cT2,cN1,cM1) CT abdomen/pelvis 05/29/2020 pancreas head mass versus focal pancreatitis MRI abdomen 06/17/2020-hypoenhancing mass in the pancreas head, 3.7 x 3 cm, lesion abuts the anterior surface of the portal vein, subtle 1 cm hypoenhancing segment 6 lesion suspicious for metastasis, no lymphadenopathy, distended gallbladder containing sludge and probable tiny gallstones EUS 07/04/2020, 10 mm malignant appearing peripancreatic lymph node, 35 x 25 mm pancreas head mass, abutment of the portal vein and superior mesenteric vein, no invasion of the SMA or celiac trunk, upstream pancreatic duct dilation, FNA biopsy of the pancreas head mass-adenocarcinoma Ultrasound liver 07/20/2020-liver lesion could not be seen, biopsy procedure aborted CT 08/04/2020-new peripheral right liver lesion, subtle low density lesion in the inferior right liver at the site of a hypoenhancing lesion on MRI, no biliary duct dilatation, pancreas head mass Obstructive jaundice secondary to #1-ERCP 07/04/2020-low to mid duct stricture, uncovered metal stent placed in the common bile duct Atrial fibrillation-maintained on apixaban anticoagulation Glaucoma Aortic aneurysm Admission 08/04/2020 with a fever and hyperbilirubinemia-blood cultures positive for Klebsiella  Mr.  Contreras was admitted to the  ICU with rapid atrial fibrillation/CHF.  The bilirubin is higher today.  The elevated bilirubin may be related to the CHF or stent occlusion.  He continues IV antibiotics for treatment of Klebsiella bacteremia.  We discussed his expected prognosis without treatment of the pancreas cancer and with a trial of systemic therapy.  He would like to get out of the hospital as soon as possible to work on end-of-life planning.  He does wish to proceed with Port-A-Cath placement and chemotherapy when he has recovered from the bacteremia.  Recommendations: Continue antibiotics Delay Port-A-Cath until he has completed antibiotics with repeat negative cultures Follow bilirubin, consider repeat ERCP for persistent elevation Management of A. fib/CHF per cardiology Please call oncology as needed, outpatient follow-up will be scheduled at the Cancer center.  I will check on him within the next few days    LOS: 3 days   Betsy Coder, MD   08/08/2020, 3:01 PM

## 2020-08-08 NOTE — TOC Initial Note (Signed)
Transition of Care (TOC) - Initial/Assessment Note    Patient Details  Name: Barry Contreras. MRN: [id number] Date of Birth: [birth year]  Transition of Care [facility]) CM/SW Contact:    Leeroy Cha, RN Phone Number: 08/08/2020, 8:45 AM  Clinical Narrative:                 The patient is an 87 year old Caucasian male with a past medical history significant for but not limited to pancreatic cancer with recent systemic chemotherapy, history of obstructive jaundice status post biliary stent placement, proximal atrial fibrillation on anticoagulant with Eliquis, hypertension, prediabetes as well as other comorbidities who presented with a chief complaint of fever, nausea, vomiting.  Patient daughter was at bedside when he came in and he was having chills and rigors.  Daughter just come into town to visit and states that yesterday began to develop fevers or chills.  The patient had 1 episode of nausea vomiting but no diarrhea.  No chest pain.  He has a history of obstructive jaundice and is status post biliary stent placement with [facility] in 07/04/2020.  In the ED is found to be tachycardic and tachypneic with a lactic acid of 3.4 and abnormal LFTs.  He underwent a CT of the abdomen and pelvis which showed pneumobilia with slight gallbladder wall enhancement and wall thickening.  A dedicated right upper quadrant ultrasound showed gallbladder wall thickening with no visible stone.  The ED discussed with Dr. Michail Sermon who recommended the patient to be kept n.p.o. and continue antibiotics.  General surgery was also consulted and recommended the GI evaluation and he started on IV cefepime and Flagyl and this has now been de-escalated to IV ceftriaxone given that further work-up is revealed that he got a Klebsiella bacteremia.   He was placed on a clear liquid diet by GI and was advanced to full liquid diet yesterday.  GI recommending continue IV antibiotics for now and deferring to oncology whether port should be  placed with his bacteremia and Dr. Michail Sermon is to discuss with Dr. Watt Climes on the patency of the biliary stent.  Diet was further advanced to a soft diet yesterday.  This morning he was in A. fib with RVR with uncontrolled heart rates he was given a dose of 5 metoprolol which improved his heart rates.  We will change to oral regimen based on the sensitivities of [name].  Oncology recommending delaying Port-A-Cath placement until he has completed at least a week of antibiotics with repeat negative blood cultures.  Klebsiella pneumoniae is pansensitive now and only resistant to ampicillin so will discuss with ID about transitioning to p.o and they are recommending treating with Augmenting 875 mg po BID for total Antibiotic course being a total of 10 days. TOC PLAN OF CARE  following for progression and toc needs. Expected Discharge Plan: Home/Self Care Barriers to Discharge: Continued Medical Work up   Patient Goals and CMS Choice Patient states their goals for this hospitalization and ongoing recovery are:: to go back home and be well      Expected Discharge Plan and Services Expected Discharge Plan: Home/Self Care   Discharge Planning Services: CM Consult   Living arrangements for the past 2 months: Single Family Home                                      Prior Living Arrangements/Services Living arrangements for the  past 2 months: Single Family Home Lives with:: Spouse Patient language and need for interpreter reviewed:: Yes Do you feel safe going back to the place where you live?: Yes            Criminal Activity/Legal Involvement Pertinent to Current Situation/Hospitalization: No - Comment as needed  Activities of Daily Living Home Assistive Devices/Equipment: None ADL Screening (condition at time of admission) Patient's cognitive ability adequate to safely complete daily activities?: Yes Is the patient deaf or have difficulty hearing?: Yes Does the patient have  difficulty seeing, even when wearing glasses/contacts?: Yes Does the patient have difficulty concentrating, remembering, or making decisions?: No Patient able to express need for assistance with ADLs?: Yes Does the patient have difficulty dressing or bathing?: No Independently performs ADLs?: Yes (appropriate for developmental age) Does the patient have difficulty walking or climbing stairs?: No Weakness of Legs: None Weakness of Arms/Hands: None  Permission Sought/Granted                  Emotional Assessment Appearance:: Appears stated age Attitude/Demeanor/Rapport: Engaged Affect (typically observed): Calm Orientation: : Oriented to Place, Oriented to Self, Oriented to  Time, Oriented to Situation Alcohol / Substance Use: Not Applicable Psych Involvement: No (comment)  Admission diagnosis:  Bacteremia [R78.81] RUQ abdominal pain [R10.11] Pneumobilia [K83.8] Sepsis ([place]) [A41.9] Sepsis, due to unspecified organism, unspecified whether acute organ dysfunction present [facility]) [A41.9] Patient Active Problem List   Diagnosis Date Noted   Bacteremia 08/05/2020   Sepsis ([place]) 08/04/2020   Elevated LFTs 08/04/2020   Serum total bilirubin elevated 08/04/2020   Family history of breast cancer 07/27/2020   Family history of colon cancer 07/27/2020   Goals of care, counseling/discussion 07/24/2020   Cancer of head of pancreas ([place]) 07/13/2020   Hardening of the aorta (main artery of the heart) ([place]) 07/05/2020   Hypercalcemia 07/05/2020   Hypertension 07/05/2020   Macular degeneration 07/05/2020   Mitral regurgitation 07/05/2020   Paroxysmal atrial fibrillation ([place]) 07/05/2020   Prediabetes 07/05/2020   Sleep apnea 07/05/2020   Thoracic aortic aneurysm without rupture ([place]) 07/05/2020   Thrombophilia ([place]) 07/05/2020   Primary open angle glaucoma of right eye 10/01/2012   Nonexudative age-related macular degeneration 03/08/2011   PCP:  Lajean Manes, MD Pharmacy:    [id number] [place] [place] [address]Phone: [phone]: [phone]    Social Determinants of Health (SDOH) Interventions    Readmission Risk Interventions No flowsheet data found.

## 2020-08-08 NOTE — Consult Note (Addendum)
Cardiology Consultation:   Patient ID: Barry Contreras. MRN: [id number]; DOB: 1931-08-25  Admit date: 08/04/2020 Date of Consult: 08/08/2020  PCP:  Lajean Manes, MD   [facility] Providers Cardiologist:  Elouise Munroe, MD      NEW   Patient Profile:   Barry Contreras. is a 87 y.o. male with a hx of pancreatic cancer, hx of obstructive jaundice s/p biliary stent, PAF on Eliquis, HTN, admitted with sepsis 08/04/20 secondary to intra=abd. infection who is being seen 08/08/2020 for the evaluation of atrial fib with RVR at the request of Dr. [name].  History of Present Illness:   Barry Contreras with above hx with including aortic aneurysm. He was seen by Dr. Benay Spice for his pancreatic cancer and plans for chemo. He has been followed by Dr. [name] with TCTS for  mild-moderate ascending thoracic fusiform aneurysm 4.4 cm. was stable in 2019 with plans for repeat CT in 2021.  CT of chest in 07/2019 with 4.2 CM ascending thoracic aortic aneurysm. No dissection.  Also noted coronary artery calcifications.  In Dec 2018 was diagnosed with a fib and echo with normal LV function no aortic valve dysfunction.   Last echo 2018 with normal EF and valves mild AS and AR, mild Barry.  Pt presented to ER 08/04/20 with fever, N/V.  DX with sepsis with intra-abd infection.  IV abx started and blood cultures.  IV fluids given.  On admit in a fib he had missed his home meds of metoprolol and dilt. He was given one dose of IV lopressor and HR was 170.  Blood cultures with Klebsiella bacteremia.  During evening of 08/07/20 pt developed acute SIB and HR to 120s and placed on dilt drip lasix ordered, pl effusions on CXR.    HR has been in the 120s to 90s since admit  BP has been elevated as well  Na 135, k+ 3.0, BUN 9, Cr 0.57 Mg+2.0 AST 134, ALK Phos 559 ALT 117  BNP 371  Hs troponin 12 and 12  Hgb 11.8, WBC 13.9 plts 332 EKG:  The EKG was personally reviewed and demonstrates:  08/04/20 with a fib with RVR at 171,  ST depression laterally may be due to rate.  EKG at 740 Pm with a fib rate of 125 similar ST depression, no EKGs to compare prior to 08/04/20.  Telemetry:  Telemetry was personally reviewed and demonstrates:  atrial fib.  PCXR with CHF new. + pl effusions Rt>Lt  BP 181/92 P 127 R 22 to 32 afebrile.  No hx of chest pain or DOE.  He has been in atrial fib for years.  Rate is now controlled on IV dilt at 15 and BP with improved control.   Past Medical History:  Diagnosis Date   Cancer [facility])    Diverticulitis    Family history of breast cancer 07/27/2020   Family history of colon cancer 07/27/2020   Hyperglycemia    Hypertension    Inguinal hernia    Macular degeneration    Mitral regurgitation    Umbilical hernia     Past Surgical History:  Procedure Laterality Date   BILIARY STENT PLACEMENT N/A 07/04/2020   Procedure: BILIARY STENT PLACEMENT;  Surgeon: Clarene Essex, MD;  Location: [facility] ENDOSCOPY;  Service: Endoscopy;  Laterality: N/A;   CATARACT EXTRACTION     ERCP N/A 07/04/2020   Procedure: ENDOSCOPIC RETROGRADE CHOLANGIOPANCREATOGRAPHY (ERCP);  Surgeon: Clarene Essex, MD;  Location: [facility];  Service: Endoscopy;  Laterality:  N/A;   EUS N/A 07/04/2020   Procedure: UPPER ENDOSCOPIC ULTRASOUND (EUS) RADIAL;  Surgeon: Clarene Essex, MD;  Location: [facility]OSCOPY;  Service: Endoscopy;  Laterality: N/A;   FINE NEEDLE ASPIRATION  07/04/2020   Procedure: FINE NEEDLE ASPIRATION (FNA) LINEAR;  Surgeon: Clarene Essex, MD;  Location: [facility] ENDOSCOPY;  Service: Endoscopy;;   IRRIGATION AND DEBRIDEMENT SEBACEOUS CYST     SPHINCTEROTOMY  07/04/2020   Procedure: Joan Mayans;  Surgeon: Clarene Essex, MD;  Location: [facility] ENDOSCOPY;  Service: Endoscopy;;     Home Medications:  Prior to Admission medications   Medication Sig Start Date End Date Taking? Authorizing Provider  acetaminophen (TYLENOL) 650 MG CR tablet Take 1,300 mg by mouth every 8 (eight) hours as needed for pain.   Yes [provider]   apixaban (ELIQUIS) 5 MG TABS tablet Take 5 mg by mouth 2 (two) times daily.   Yes [provider]  Apoaequorin (PREVAGEN PO) Take 1 tablet by mouth daily.   Yes [provider]  azelastine (ASTELIN) 0.1 % nasal spray Place 1 spray into both nostrils daily as needed for rhinitis (To keep ears open).   Yes [provider]  benazepril-hydrochlorthiazide (LOTENSIN HCT) 20-12.5 MG per tablet Take 1 tablet by mouth daily.     Yes [provider]  CARDIZEM LA 120 MG 24 hr tablet Take 120 mg by mouth daily.  06/17/19  Yes [provider]  dorzolamide-timolol (COSOPT) 22.3-6.8 MG/ML ophthalmic solution Place 2 drops into both eyes 2 (two) times daily.   Yes [provider]  ketotifen (ZADITOR) 0.025 % ophthalmic solution Place 1 drop into both eyes daily as needed (Clear and dry eyes).   Yes [provider]  Lysine 1000 MG TABS Take 1,000 mg by mouth daily.   Yes [provider]  metoprolol (LOPRESSOR) 50 MG tablet Take 50 mg by mouth 2 (two) times daily.     Yes [provider]  Multiple Vitamin (MULTIVITAMIN) tablet Take 1 tablet by mouth daily.     Yes [provider]  Multiple Vitamins-Minerals (VISION-VITE PRESERVE PO) Take 1 tablet by mouth 2 (two) times daily.   Yes [provider]  Saw Palmetto 450 MG CAPS Take 450 mg by mouth 2 (two) times daily.   Yes [provider]  traMADol (ULTRAM) 50 MG tablet Take 0.5-1 tablets (25-50 mg total) by mouth every 6 (six) hours as needed. Patient taking differently: Take 50 mg by mouth every 6 (six) hours as needed for severe pain. 07/24/20  Yes [name], MD  lidocaine-prilocaine (EMLA) cream Apply 1 application topically as directed. Apply 1/2 tablespoon to port 2 hours prior to stick and cover with plastic wrap to numb site 07/26/20   [name], MD  ondansetron (ZOFRAN) 8 MG tablet Take 1 tablet (8 mg total) by mouth every 8 (eight) hours as  needed for nausea or vomiting. 07/26/20   [name], MD  prochlorperazine (COMPAZINE) 10 MG tablet Take 1 tablet (10 mg total) by mouth every 6 (six) hours as needed for nausea. 07/26/20   [name], MD    Inpatient Medications: Scheduled Meds:  amoxicillin-clavulanate  1 tablet Oral Q12H   apixaban  5 mg Oral BID   Chlorhexidine Gluconate Cloth  6 each Topical Daily   diltiazem  120 mg Oral Daily   furosemide  40 mg Intravenous BID   mouth rinse  15 mL Mouth Rinse BID   melatonin  3 mg Oral Once  metoprolol tartrate  50 mg Oral BID   phosphorus  500 mg Oral BID   potassium chloride  40 mEq Oral BID   Continuous Infusions:  diltiazem (CARDIZEM) infusion 15 mg/hr (08/08/20 1000)   PRN Meds: acetaminophen, hydrALAZINE, metoprolol tartrate, ondansetron (ZOFRAN) IV, traMADol, traZODone  Allergies:   No Known Allergies  Social History:   Social History   Socioeconomic History   Marital status: Married    Spouse name: Not on file   Number of children: Not on file   Years of education: Not on file   Highest education level: Not on file  Occupational History   Not on file  Tobacco Use   Smoking status: Former    Years: 10.00    Types: Cigarettes    Quit date: 01/08/1968    Years since quitting: 52.6   Smokeless tobacco: Never  Vaping Use   Vaping Use: Never used  Substance and Sexual Activity   Alcohol use: Yes    Comment: rarely   Drug use: No   Sexual activity: Not on file  Other Topics Concern   Not on file  Social History Narrative   Not on file   Social Determinants of Health   Financial Resource Strain: Not on file  Food Insecurity: Not on file  Transportation Needs: Not on file  Physical Activity: Not on file  Stress: Not on file  Social Connections: Not on file  Intimate Partner Violence: Not on file    Family History:    Family History  Problem Relation Age of Onset   Hypertension Mother    Stroke Mother    Heart attack Mother     Hypertension Father    Alzheimer's disease Father    Pneumonia Father    Stroke Sister    Alzheimer's disease Sister    Congenital heart disease Brother    Breast cancer Paternal Aunt        dx after 17   Lymphoma Paternal Grandfather    Breast cancer Niece        dx 53s   Colon cancer Nephew        d. 69s     ROS:  Please see the history of present illness.  General:no colds or fevers, no weight changes Skin:no rashes or ulcers HEENT:no blurred vision, no congestion CV:see HPI PUL:see HPI GI:no diarrhea constipation or melena, no indigestion GU:no hematuria, no dysuria MS:no joint pain, no claudication Neuro:no syncope, no lightheadedness Endo:no diabetes, no thyroid disease  All other ROS reviewed and negative.     Physical Exam/Data:   Vitals:   08/08/20 0630 08/08/20 0700 08/08/20 0800 08/08/20 0900  BP: (!) 182/100 (!) 171/104 (!) 181/92 (!) 144/95  Pulse: (!) 120 (!) 113 (!) 121 (!) 107  Resp: (!) 25 (!) 22 (!) 22 (!) 21  Temp:   98.2 F (36.8 C)   TempSrc:   Oral   SpO2: 92% 94% 93% 91%  Weight:      Height:        Intake/Output Summary (Last 24 hours) at 08/08/2020 1130 Last data filed at 08/08/2020 1000 Gross per 24 hour  Intake 1906.9 ml  Output 5050 ml  Net -3143.1 ml   Last 3 Weights 08/04/2020 07/24/2020 07/13/2020  Weight (lbs) 162 lb 162 lb 3.2 oz 161 lb 6.4 oz  Weight (kg) 73.483 kg 73.573 kg 73.211 kg     Body mass index is 29.63 kg/m.  General:  Well nourished, well developed, in no acute  distress HEENT: normal Lymph: no adenopathy Neck: no JVD Endocrine:  No thryomegaly Vascular: No carotid bruits; pedal pulses 2+ bilaterally   Cardiac:  irreg irreg; no murmur gallup rub or click Lungs:  clear to diminished to auscultation bilaterally, no wheezing, rhonchi or rales  Abd: soft, +tender, no hepatomegaly  Ext: no edema Musculoskeletal:  No deformities, BUE and BLE strength normal and equal Skin: warm and dry  Neuro:  alert and t, no focal  abnormalities noted Psych:  Normal affect    Relevant CV Studies: Echo 12/12/2016  Study Conclusions   - Left ventricle: The cavity size was normal. Systolic function was    normal. The estimated ejection fraction was in the range of 60%    to 65%. Wall motion was normal; there were no regional wall    motion abnormalities. The study is not technically sufficient to    allow evaluation of LV diastolic function.  - Aortic valve: There was very mild stenosis. There was mild    regurgitation.  - Aorta: Aoritc root not well visualized.  - Mitral valve: There was mild regurgitation.  - Left atrium: The atrium was mildly dilated.  - Atrial septum: No defect or patent foramen ovale was identified.  - Pulmonary arteries: PA peak pressure: 39 mm Hg (S).   -------------------------------------------------------------------  Study data:  Comparison was made to the study of 10/03/2015.  Study  status:  Routine.  Procedure:  The patient reported no pain pre or  post test. Transthoracic echocardiography. Image quality was  adequate.          Transthoracic echocardiography.  M-mode,  complete 2D, spectral Doppler, and color Doppler.  Birthdate:  Patient birthdate: 1931-09-19.  Age:  Patient is 87 yr old.  Sex:  Gender: male.    BMI: 28.3 kg/m^2.  Patient status:  Outpatient.  Study date:  Study date: 12/12/2016. Study time: 02:28 PM.  Location:  [facility]   -------------------------------------------------------------------   -------------------------------------------------------------------  Left ventricle:  The cavity size was normal. Systolic function was  normal. The estimated ejection fraction was in the range of 60% to  65%. Wall motion was normal; there were no regional wall motion  abnormalities. The study is not technically sufficient to allow  evaluation of LV diastolic function.   -------------------------------------------------------------------  Aortic valve:    Trileaflet; moderately thickened, moderately  calcified leaflets. Mobility was not restricted.  Doppler:   There  was very mild stenosis.   There was mild regurgitation.   -------------------------------------------------------------------  Aorta:  Aoritc root not well visualized. Aortic root: The aortic  root was normal in size.   -------------------------------------------------------------------  Mitral valve:   Mildly thickened leaflets . Mobility was not  restricted.  Doppler:  Transvalvular velocity was within the normal  range. There was no evidence for stenosis. There was mild  regurgitation.    Valve area by pressure half-time: 3.28 cm^2.  Indexed valve area by pressure half-time: 1.76 cm^2/m^2.    Peak  gradient (D): 5 mm Hg.   -------------------------------------------------------------------  Left atrium:  The atrium was mildly dilated.   -------------------------------------------------------------------  Atrial septum:  No defect or patent foramen ovale was identified.     -------------------------------------------------------------------  Right ventricle:  The cavity size was normal. Wall thickness was  normal. Systolic function was normal.   -------------------------------------------------------------------  Pulmonic valve:    Doppler:  Transvalvular velocity was within the  normal range. There was no evidence for stenosis. There was mild  regurgitation.   -------------------------------------------------------------------  Tricuspid valve:   Structurally normal valve.    Doppler:  Transvalvular velocity was within the normal range. There was mild  regurgitation.   -------------------------------------------------------------------  Pulmonary artery:   The main pulmonary artery was normal-sized.  Systolic pressure was within the normal range.   -------------------------------------------------------------------  Right atrium:  The atrium was normal in size.   Laboratory Data:  High Sensitivity Troponin:   Recent Labs  Lab 08/07/20 2045 08/07/20 2214  TROPONINIHS 12 12     Chemistry Recent Labs  Lab 08/06/20 0651 08/07/20 0458 08/08/20 0257  NA 136 136 135  K 3.3* 3.4* 3.0*  CL 107 107 102  CO2 20* 23 22  GLUCOSE 131* 123* 142*  BUN _0 CREATININE 0.71 0.60* 0.57*  CALCIUM 8.0* 8.1* 8.5*  GFRNONAA >60 >60 >60  ANIONGAP _1 Recent Labs  Lab 08/06/20 0651 08/07/20 0458 08/08/20 0257  PROT 6.4* 6.3* 6.6  ALBUMIN 3.0* 3.1* 3.3*  AST 49* 27 134*  ALT 111* 79* 117*  ALKPHOS 203* 182* 559*  BILITOT 1.6* 1.3* 3.9*   Hematology Recent Labs  Lab 08/06/20 0651 08/07/20 0458 08/08/20 0257  WBC 14.6* 12.4* 13.9*  RBC 3.69*  3.66* 3.43* 3.94*  HGB 11.0* 10.5* 11.8*  HCT 34.8* 32.3* 35.7*  MCV 94.3 94.2 90.6  MCH 29.8 30.6 29.9  MCHC 31.6 32.5 33.1  RDW 14.4 14.4 14.5  PLT 253 260 332   BNP Recent Labs  Lab 08/07/20 2045  BNP 371.1*    DDimer No results for input(s): DDIMER in the last 168 hours.   Radiology/Studies:  CT ABDOMEN PELVIS W CONTRAST  Result Date: 08/04/2020 CLINICAL DATA:  Provided history of: Cholecystitis (Ped 0-18y) possible biliary stent obstruction Technologist notes state recent diagnosis of pancreatic cancer with biliary stent placed last month. Abdominal pain, nausea, vomiting, and lethargy. EXAM: CT ABDOMEN AND PELVIS WITH CONTRAST TECHNIQUE: Multidetector CT imaging of the abdomen and pelvis was performed using the standard protocol following bolus administration of intravenous contrast. CONTRAST:  46m OMNIPAQUE IOHEXOL 350 MG/ML SOLN COMPARISON:  Abdominopelvic CT 05/29/2020, MRI 06/17/2020 FINDINGS: Lower chest: Small right pleural effusion. Bibasilar atelectasis. No basilar pulmonary nodule. Small hiatal hernia. Hepatobiliary: New ill-defined hypodensity in the periphery of the right lobe, series 2, image 14. Subtle low-density in the inferior right lobe series 2, image 33, at  site of hypoenhancing lesion on MRI. Stable small subcapsular cyst in the right lobe, series 2, image 33. Biliary stent is in place with air and scattered fluid in the stent. No bile duct dilatation surround the stent. Resultant pneumobilia. There is no intrahepatic biliary ductal dilatation. Gallbladder is physiologically distended, there may be minimal gallbladder wall enhancement and wall thickening. No calcified gallstone. Pancreas: Ill-defined low-density in the pancreatic head with margins difficult to delineate, measuring approximately 3.3 x 2.3 cm, previously 3.6 x 2.6 cm. Mild atrophy of the distal pancreas. No ductal dilatation. No peripancreatic fat stranding. Spleen: Punctate granuloma.  Normal in size.  Splenule anteriorly. Adrenals/Urinary Tract: No adrenal nodule. Exophytic 4.7 cm cyst from the anterior right kidney. Smaller cyst in the lower right kidney. No hydronephrosis. No perinephric edema. There is symmetric renal excretion on delayed phase imaging. Minimally distended urinary bladder. Stomach/Bowel: Small to moderate hiatal hernia. Stomach is decompressed. There is no bowel obstruction or inflammation. Sigmoid diverticulosis without diverticulitis. Additional scattered diverticular seen in the more proximal colon. High-riding cecum in the right mid abdomen. Diminutive appendix tentatively visualized. No appendicitis. Vascular/Lymphatic:  Aortic atherosclerosis and tortuosity. No aortic aneurysm. The portal, splenic, superior mesenteric veins are patent. Small peripancreatic nodes are not enlarged by size criteria. No enlarged lymph nodes in the abdomen or pelvis. Reproductive: Prostate is unremarkable. Other: Ascites. No free air or focal fluid collection. There is a fat containing umbilical hernia. Moderate to large fat containing left inguinal hernia, small fat containing right inguinal hernia. 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 Fusion of L1 through L3 vertebral bodies. Trabeculated lesion within T10 is likely a hemangioma. No acute osseous abnormalities are seen. IMPRESSION: 1. Biliary stent in place with air and scattered fluid in the stent. No biliary dilatation. 2. Known pancreatic head mass with margins difficult to delineate, measuring approximately 3.3 x 2.3 cm, previously 3.6 x 2.6 cm. 3. New liver lesion suspicious for metastasis, there is a subtle hypodensity in the right lobe at site of suspected metastasis on MRI. 4. Slight gallbladder wall enhancement and wall thickening, nonspecific in the setting. 5. Small right pleural effusion. 6. Colonic diverticulosis without diverticulitis. 7. Hiatal hernia. 8. Fat containing umbilical and bilateral inguinal hernias. 9. 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 Aortic Atherosclerosis (ICD10-I70.0). Electronically Signed   By: Keith Rake M.D.   On: 08/04/2020 19:23   DG CHEST PORT 1 VIEW  Result Date: 08/08/2020 CLINICAL DATA:  Recent diagnosis of pancreatic cancer. Fever malaise. Nausea and vomiting. EXAM: PORTABLE CHEST 1 VIEW COMPARISON:  Chest x-ray 08/07/2020. FINDINGS: Cardiomegaly with bilateral interstitial prominence in small bilateral pleural effusions again noted. Findings again consistent with CHF. Pneumonitis cannot be excluded. No pneumothorax. IMPRESSION: Cardiomegaly with bilateral interstitial prominence and small bilateral pleural effusions again noted. Findings again consistent with CHF. Pneumonitis cannot be excluded. Similar findings noted on prior study of 08/07/2020. Electronically Signed   By: Marcello Moores  Register   On: 08/08/2020 08:53   DG Chest Port 1 View  Result Date: 08/07/2020 CLINICAL DATA:  Acute respiratory distress. EXAM: PORTABLE CHEST 1 VIEW COMPARISON:  Radiograph 08/04/2020. Lung bases from abdominal CT 08/04/2020. FINDINGS: Increasing hazy  opacity at the lung bases consistent with increasing pleural effusions, right greater than left. There is septal thickening and alveolar opacities consistent with pulmonary edema. Mild cardiomegaly with equivocal increase. No pneumothorax or confluent airspace disease. Bilateral glenohumeral arthropathy degenerative change of the shoulders. IMPRESSION: Findings consistent with CHF, new from prior exam. Electronically Signed   By: Keith Rake M.D.   On: 08/07/2020 20:03   DG Chest Portable 1 View  Result Date: 08/04/2020 CLINICAL DATA:  Fatigue EXAM: PORTABLE CHEST 1 VIEW COMPARISON:  CT 07/21/2019 FINDINGS: Linear densities in the lung bases could reflect scarring or atelectasis. Heart is normal size. No effusions or acute bony abnormality. IMPRESSION: Bibasilar atelectasis or scarring Electronically Signed   By: Rolm Baptise M.D.   On: 08/04/2020 16:55   US Abdomen Limited RUQ (LIVER/GB)  Result Date: 08/04/2020 CLINICAL DATA:  Right upper quadrant pain EXAM: ULTRASOUND ABDOMEN LIMITED RIGHT UPPER QUADRANT COMPARISON:  CT earlier today FINDINGS: Gallbladder: Shadowing noted from the gallbladder fundus, likely due to gas within the gallbladder. Gallbladder wall is thickened at 7 mm. No visible stones. Common bile duct: Diameter: Metallic biliary stent noted. Unable to measure the common bile duct. Liver: Pneumobilia. No focal hepatic abnormality. Portal vein is patent on color Doppler imaging with normal direction of blood flow towards the liver. Other: None. IMPRESSION: Pneumobilia related to  biliary stent. Gallbladder wall thickening.  No visible stones. Electronically Signed   By: Rolm Baptise M.D.   On: 08/04/2020 21:18     Assessment and Plan:   Acute CHF most likely diastolic due to volume overload, no correct I&O until last evening. IV fluids of 150/hr with his acute sepsis. No weights.  Would increase po dilt if echo with stable EF, otherwise increase BB  Atrial fib permanent and with  sepsis has been elevated, pt is on eliquis 5 mg BID, dilt po 120 and IV last pm along with metoprolol 50 BID (these are his home meds as well) Sepsis per IM on ABX with klebsiella bacteremia.  Hx of biliary stent placement 06/2020  Pancreatic cancer holding off on portocath due to bacteremia chem in future Neg troponin on labs, coronary calcification on CTA of chest not unexpected in 87 year old.  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 The patient's score is based upon: CHF History: No HTN History: Yes Diabetes History: No Stroke History: Yes Vascular Disease History: Yes Age Score: 2 Gender Score: 0   Attending amendment: Gooding likely 4, discussed with NP, no definite history of stroke.     For questions or updates, please contact [place] Please consult [email or web address] for contact info under    Signed, Cecilie Kicks, NP  08/08/2020 11:30 AM  Patient seen and examined with Cecilie Kicks NP.  Agree as above, with the following exceptions and changes as noted below. Barry Contreras is an 87 yo male on whom we are consulted for management of his known permanent atrial fibrillation. Overnight NP sent a non urgent consult request for today, documentation suggests cardiology consulted overnight however that was not the case and this was discussed personally with NP. At the time of my interview, Barry Contreras is feeling well and has excellent rate control on IV diltiazem. We reviewed echo results showing preserved, if not hyperdynamic LVEF and no hemodynamically significant valve disease. He does have mild Barry and mild-mod AI. He has chronically been on Eliquis and primary service requests guidance on holding eliquis for procedures. Gen: NAD, CV: iRRR,  no murmurs, Lungs: clear, Abd: soft, Extrem: Warm, well perfused, no edema, Neuro/Psych: alert and oriented x 3, normal mood and affect. All available labs, radiology testing, previous records reviewed. Continue IV diltiazem for today, will consider transitioning to oral in next 1-2 days pending decisions about procedures. Eliquis should be continued without interruption given no immediate plans for ERCP. If ERCP planned, would bridge with IV heparin given permanent AF and CHADS2VASC score of 4 with no history of bleeding.   Cardiology will follow.  Elouise Munroe, MD

## 2020-08-08 NOTE — Progress Notes (Signed)
PROGRESS NOTE    Barry Contreras.  KGY:[id number] DOB: 1931/06/14 DOA: 08/04/2020 PCP: Lajean Manes, MD   Brief Narrative:  The patient is an 87 year old Caucasian male with a past medical history significant for but not limited to pancreatic cancer with recent systemic chemotherapy, history of obstructive jaundice status post biliary stent placement, proximal atrial fibrillation on anticoagulant with Eliquis, hypertension, prediabetes as well as other comorbidities who presented with a chief complaint of fever, nausea, vomiting.  Patient daughter was at bedside when he came in and he was having chills and rigors.  Daughter just come into town to visit and states that yesterday began to develop fevers or chills.  The patient had 1 episode of nausea vomiting but no diarrhea.  No chest pain.  He has a history of obstructive jaundice and is status post biliary stent placement with [facility] in 07/04/2020.  In the ED is found to be tachycardic and tachypneic with a lactic acid of 3.4 and abnormal LFTs.  He underwent a CT of the abdomen and pelvis which showed pneumobilia with slight gallbladder wall enhancement and wall thickening.  A dedicated right upper quadrant ultrasound showed gallbladder wall thickening with no visible stone.  The ED discussed with Dr. Michail Sermon who recommended the patient to be kept n.p.o. and continue antibiotics.  General surgery was also consulted and recommended the GI evaluation and he started on IV cefepime and Flagyl and this has now been de-escalated to IV ceftriaxone given that further work-up is revealed that he got a Klebsiella bacteremia.  He was placed on a clear liquid diet by GI and was advanced to full liquid diet yesterday.  GI recommending continue IV antibiotics for now and deferring to oncology whether port should be placed with his bacteremia and Dr. Michail Sermon is to discuss with Dr. Watt Climes on the patency of the biliary stent.  Diet was further advanced to a soft diet  yesterday.  This morning he was in A. fib with RVR with uncontrolled heart rates he was given a dose of 5 metoprolol which improved his heart rates.  We will change to oral regimen based on the sensitivities of [name].  Oncology recommending delaying Port-A-Cath placement until he has completed at least a week of antibiotics with repeat negative blood cultures.  Klebsiella pneumoniae is pansensitive now and only resistant to ampicillin so will discuss with ID about transitioning to p.o and they are recommending treating with Augmenting 875 mg po BID for total Antibiotic course being a total of 10 days.  Patient decompensated overnight and went into A. fib with RVR had flash pulmonary edema.  Heart rates were in the 170s to 160s and he was given metoprolol with minimal improvement so he transferred to the stepdown unit and placed on a Cardizem drip.  Because of his shortness of breath a stat x-ray was done and showed consistency with CHF so he was stopped with his fluid hydration and given IV Lasix.  Cardiology was consulted overnight.  GI recommending resuming broad-spectrum antibiotics will use change back to IV ceftriaxone and Flagyl.  Echocardiogram is done and showed an EF of 70 to 75% but left ventricular diastolic parameters could not be assessed given his A. fib.  Assessment & Plan:   Principal Problem:   Sepsis ([facility]) Active Problems:   Hypertension   Paroxysmal atrial fibrillation (HCC)   Cancer of head of pancreas (HCC)   Elevated LFTs   Serum total bilirubin elevated   Bacteremia  Sepsis  secondary to intra-abdominal infection and Klebsiella bacteremia, present admission -Patient met sepsis criteria as he is febrile, tachycardic and had a leukocytosis and a: Sepsis physiology is now improving -He is status post biliary stent placement on 06/26/2020 for obstructive jaundice and right upper quadrant ultrasound showed pneumobilia -GI consulted and recommending continue IV antibiotics  keeping the patient n.p.o initially but further advance diet now he is on a soft diet -He started on IV cefepime and Flagyl but this has been de-escalated to IV ceftriaxone given that he is a Klebsiella pneumoniae with Sensitivities showing pan sensitivity except to ampicillin; after further discussion with ID Dr. Juleen China will change the patient to Augmentin 875 mg p.o. twice daily and treat for total of 10 days however now changing back to IV ceftriaxone and IV Flagyl at the recommendations of GI -He received 2 L of IV fluid in the ED and received maintenance IV fluid hydration however became volume overloaded so has not been stopped -Lactic acid level was elevated and trending down at 2.3; on presentation was 5.3 -WBC went from 17.2 and is now improving slowly and is now 12.4 -GI recommending discontinuing antibiotics and supportive care -Diet has been advanced to a soft diet by gastroenterology and Dr. Michail Sermon did discuss with Dr. Watt Climes about the patency of his biliary stent; GI does not feel that the patient needs an ERCP at this time and recommends continuing medical management and supportive care  Abnormal LFTs/elevated LFTs, improving  -In the setting of intrahepatic infection from biliary stent -Patient has CT of the abdomen pelvis as well as right upper quadrant ultrasound -AST went from 192 is now trended down to 27 however worsened to 134 -ALT went from 220 and is now trended down to 79 however now worsened to 117 -Acute hepatitis panel was negative -He also has metastatic liver disease and initially planned to have a liver biopsy on 07/20/2020 but this was aborted suspicious for not well seen on ultrasound  -Has been consulted in the feel that the metal biliary stent remains patent and they do not think that repeat ERCP and additional stent placement is needed at this time  Acute CHF [place] Diastolic and volume overload in the setting of volume resuscitation -Worsened in the setting of  A. fib with RVR and he was found to have rales bilaterally Patient became dyspneic overnight and chest x-ray done showed consistent with CHF X-upon further review patient was +5.5 L so IV fluids were stopped and he was started on diuresis -Strict I's and O's and daily weights; patient has diuresed well and now is +1.054 L since admission and repeat weight is not done -BNP was 371.1 -Cardiology was consulted for further evaluation recommendations and he has been placed on a Cardizem drip for his A. fib with RVR -Placed on supplemental oxygen given his respiratory rate increased as well as dyspnea -Continue metoprolol 50 mg p.o. twice daily -Echocardiogram obtained and showed an EF of 70 to 75% however the left ventricular diastolic function could not be evaluated given his A. Fib -Given IV Lasix 60 mg once yesterday and started on 40 twice daily today and appreciate cardiology further evaluation recommendations  Acute respiratory failure in the setting of above -SpO2: 91 % O2 Flow Rate (L/min): 2 L/min -DeSimone nontoxic via nasal cannula wean O2 as tolerated-continuous pulse oximetry maintain O2 saturation greater 90% -Continue to monitor respiratory status carefully and repeat chest x-ray in a.m.  Hyperbilirubinemia, worsened -Has been chronically elevated in the setting  of his obstructive jaundice and is now gone from 2.4 -> 2.3 -> 1.6 -> 1.3 and T bili is now worsened to 3.9 -Continue monitor and trend and repeat CMP in a.m. -GI following and they do not feel the need for a ERCP now at this point  History of pancreatic cancer suspected mets to liver -Recently underwent chemotherapy last week -Follows with Dr. Benay Spice who will be consulted and added to the care teams -Patient has a Port-A-Cath placement for systemic chemotherapy scheduled for August -Will have Dr. Hillery Aldo weighed in and he recommends deferring Port-A-Cath placement for at least a week until after is completed  antibiotics with repeat negative blood cultures  Metabolic Acidosis -Mild and now improved -Patient's CO2 is now 22, anion gap is 11, chloride level is 102 -IV fluids now stopped -Continue to monitor and trend and repeat CMP in a.m.  Hypokalemia -Patient's K+ was 3.0 -Replete with potassium chloride 40 mg twice daily x2 doses as well as p.o. K-Phos Neutral 500 mg twice daily x2 dose -Continue to Monitor and Replete as Necessary -Mag Level was stable at 2.0 -Repeat CMP in the AM  Hypophosphatemia -Patient's Phos level was 1.3 and improved to 2.0 today -Replete with p.o. K-Phos neutral 500 mg twice daily x2 dose -Continue to Monitor and Replete as Necessary -Repeat CMP in the AM  Proximal Atrial Fibrillation with RVR -Currently patient is an A. Fib but HR were elevated today and he was in RVR  -Anticoagulation with apixaban -Continue with diltiazem 120 once p.o. daily; because of his uncontrolled heart rates he is started on a Cardizem drip now and remains on it -Given IV Metoprolol 5 mg x1 with Improvement in his HR yesterday but then he worsened overnight and alert in no acute respiratory distress and failure -Continue telemetry monitoring -HR not as Tachycardic and in A Fib but not better controlled   Normocytic Anemia  -Likely dilutional drop -Patient's hemoglobin/hematocrit on admission we will 12.6/39.5 and trended down and is now 10.5/32.3 yesterday and today is 11.8/35.7 -Check anemia panel and showed an iron level of 17, U IBC of 224, TIBC of 241, saturation ratios of 7%, ferritin level 333, folate level 14.1, and vitamin B12 level 1477 -continue monitor for signs or symptoms of bleeding; currently no overt bleeding noted -Repeat CBC in a.m.  DVT prophylaxis: Anticoagulated with apixaban Code Status: FULL CODE Family Communication: No family at Bedside  Disposition Plan: Pending further clinical improvement and clearance by the specialist Status is: Inpatient  Remains  inpatient appropriate because:IV treatments appropriate due to intensity of illness or inability to take PO and Inpatient level of care appropriate due to severity of illness  Dispo: The patient is from: Home              Anticipated d/c is to:  TBD              Patient currently is not medically stable to d/c.   Difficult to place patient No  Consultants:  Gastroenterology  Cardiology Medical oncology  Procedures:  ECHOCARDIOGRAM IMPRESSIONS     1. Left ventricular ejection fraction, by estimation, is 70 to 75%. The  left ventricle has hyperdynamic function. The left ventricle has no  regional wall motion abnormalities. Left ventricular diastolic function  could not be evaluated.   2. Right ventricular systolic function is normal. The right ventricular  size is normal. There is mildly elevated pulmonary artery systolic  pressure.   3. Left atrial size was mildly  dilated.   4. The mitral valve is normal in structure. Mild mitral valve  regurgitation. No evidence of mitral stenosis.   5. The aortic valve is tricuspid. There is mild calcification of the  aortic valve. There is mild thickening of the aortic valve. Aortic valve  regurgitation is mild to moderate. Mild to moderate aortic valve  sclerosis/calcification is present, without  any evidence of aortic stenosis.   6. There is mild dilatation of the ascending aorta, measuring 39 mm.   7. The inferior vena cava is normal in size with greater than 50%  respiratory variability, suggesting right atrial pressure of 3 mmHg.   Comparison(s): Prior images reviewed side by side. Degenerative aortic  valve changes and the degree of aortic insufficiency is slightly worse.  The ascending aorta is not seen as clearly on the current study and was  previously measured as larger.   FINDINGS   Left Ventricle: Left ventricular ejection fraction, by estimation, is 70  to 75%. The left ventricle has hyperdynamic function. The left ventricle   has no regional wall motion abnormalities. The left ventricular internal  cavity size was normal in size.  There is no left ventricular hypertrophy. Left ventricular diastolic  function could not be evaluated due to atrial fibrillation. Left  ventricular diastolic function could not be evaluated.   Right Ventricle: The right ventricular size is normal. No increase in  right ventricular wall thickness. Right ventricular systolic function is  normal. There is mildly elevated pulmonary artery systolic pressure. The  tricuspid regurgitant velocity is 3.03   m/s, and with an assumed right atrial pressure of 3 mmHg, the estimated  right ventricular systolic pressure is 29.1 mmHg.   Left Atrium: Left atrial size was mildly dilated.   Right Atrium: Right atrial size was normal in size.   Pericardium: Trivial pericardial effusion is present.   Mitral Valve: The mitral valve is normal in structure. Mild mitral valve  regurgitation. No evidence of mitral valve stenosis.   Tricuspid Valve: The tricuspid valve is normal in structure. Tricuspid  valve regurgitation is mild . No evidence of tricuspid stenosis.   Aortic Valve: The aortic valve is tricuspid. There is mild calcification  of the aortic valve. There is mild thickening of the aortic valve. Aortic  valve regurgitation is mild to moderate. Aortic regurgitation PHT measures  339 msec. Mild to moderate  aortic valve sclerosis/calcification is present, without any evidence of  aortic stenosis. Aortic valve mean gradient measures 8.2 mmHg. Aortic  valve peak gradient measures 15.7 mmHg. Aortic valve area, by VTI measures  1.99 cm.   Pulmonic Valve: The pulmonic valve was normal in structure. Pulmonic valve  regurgitation is trivial. No evidence of pulmonic stenosis.   Aorta: The aortic root is normal in size and structure. There is mild  dilatation of the ascending aorta, measuring 39 mm.   Venous: The inferior vena cava is normal in  size with greater than 50%  respiratory variability, suggesting right atrial pressure of 3 mmHg.   IAS/Shunts: No atrial level shunt detected by color flow Doppler.      LEFT VENTRICLE  PLAX 2D  LVIDd:         3.60 cm  LVIDs:         1.80 cm  LV PW:         1.40 cm  LV IVS:        1.10 cm  LVOT diam:     2.30 cm  LV SV:  64  LV SV Index:   37  LVOT Area:     4.15 cm     LV Volumes (MOD)  LV vol d, MOD A2C: 38.7 ml  LV vol d, MOD A4C: 42.3 ml  LV vol s, MOD A2C: 13.2 ml  LV vol s, MOD A4C: 14.4 ml  LV SV MOD A2C:     25.5 ml  LV SV MOD A4C:     42.3 ml  LV SV MOD BP:      26.8 ml   RIGHT VENTRICLE  RV Basal diam:  4.30 cm  RV Mid diam:    2.40 cm  RV S prime:     10.60 cm/s  TAPSE (M-mode): 1.1 cm   LEFT ATRIUM             Index       RIGHT ATRIUM           Index  LA Vol (A2C):   37.3 ml 21.34 ml/m RA Area:     17.70 cm  LA Vol (A4C):   62.3 ml 35.64 ml/m RA Volume:   45.70 ml  26.15 ml/m  LA Biplane Vol: 51.2 ml 29.29 ml/m   AORTIC VALVE                    PULMONIC VALVE  AV Area (Vmax):    2.18 cm     PV Vmax:       0.86 m/s  AV Area (Vmean):   2.25 cm     PV Vmean:      57.300 cm/s  AV Area (VTI):     1.99 cm     PV VTI:        0.155 m  AV Vmax:           198.25 cm/s  PV Peak grad:  3.0 mmHg  AV Vmean:          127.375 cm/s PV Mean grad:  2.0 mmHg  AV VTI:            0.324 m  AV Peak Grad:      15.7 mmHg  AV Mean Grad:      8.2 mmHg  LVOT Vmax:         104.00 cm/s  LVOT Vmean:        69.000 cm/s  LVOT VTI:          0.155 m  LVOT/AV VTI ratio: 0.48  AI PHT:            339 msec     AORTA  Ao Root diam: 3.30 cm  Ao Asc diam:  3.87 cm   MR Peak grad:    62.1 mmHg   TRICUSPID VALVE  MR Vmax:         394.00 cm/s TR Peak grad:   36.7 mmHg  MR PISA:         1.01 cm    TR Vmax:        303.00 cm/s  MR PISA Eff ROA: 6 mm  MR PISA Radius:  0.40 cm     SHUNTS                               Systemic VTI:  0.16 m  Systemic  Diam: 2.30 cm   Antimicrobials:  Anti-infectives (From admission, onward)    Start     Dose/Rate Route Frequency Ordered Stop   08/08/20 1400  cefTRIAXone (ROCEPHIN) 2 g in sodium chloride 0.9 % 100 mL IVPB        2 g 200 mL/hr over 30 Minutes Intravenous Every 24 hours 08/08/20 1305     08/08/20 1400  metroNIDAZOLE (FLAGYL) IVPB 500 mg        500 mg 100 mL/hr over 60 Minutes Intravenous Every 12 hours 08/08/20 1305     08/07/20 2200  amoxicillin-clavulanate (AUGMENTIN) 875-125 MG per tablet 1 tablet  Status:  Discontinued        1 tablet Oral Every 12 hours 08/07/20 1630 08/08/20 1304   08/05/20 2200  metroNIDAZOLE (FLAGYL) IVPB 500 mg  Status:  Discontinued        500 mg 100 mL/hr over 60 Minutes Intravenous Every 12 hours 08/05/20 1347 08/07/20 1630   08/05/20 2000  cefTRIAXone (ROCEPHIN) 2 g in sodium chloride 0.9 % 100 mL IVPB  Status:  Discontinued        2 g 200 mL/hr over 30 Minutes Intravenous Every 24 hours 08/05/20 1347 08/07/20 1630   08/05/20 0800  ceFEPIme (MAXIPIME) 2 g in sodium chloride 0.9 % 100 mL IVPB  Status:  Discontinued        2 g 200 mL/hr over 30 Minutes Intravenous Every 12 hours 08/04/20 2218 08/05/20 1347   08/04/20 2200  metroNIDAZOLE (FLAGYL) IVPB 500 mg  Status:  Discontinued        500 mg 100 mL/hr over 60 Minutes Intravenous Every 8 hours 08/04/20 2158 08/05/20 1347   08/04/20 1900  ceFEPIme (MAXIPIME) 2 g in sodium chloride 0.9 % 100 mL IVPB        2 g 200 mL/hr over 30 Minutes Intravenous  Once 08/04/20 1858 08/04/20 2046   08/04/20 1900  metroNIDAZOLE (FLAGYL) IVPB 500 mg        500 mg 100 mL/hr over 60 Minutes Intravenous  Once 08/04/20 1858 08/04/20 2046        Subjective: Seen and examined at bedside and he was a little short of breath this morning and still in A. fib with RVR with heart rate in 120s.  He is wanting to go home.  No nausea or vomiting.  Felt a little bit better than last night.  No other concerns or complaints at this time  but still feels fatigued and dyspneic.   Objective: Vitals:   08/08/20 1300 08/08/20 1500 08/08/20 1600 08/08/20 1700  BP: (!) 128/92 (!) 142/95 (!) 145/88 139/86  Pulse: 82 91 99 91  Resp: (!) 28 (!) 24 (!) 24 19  Temp:    97.9 F (36.6 C)  TempSrc:    Oral  SpO2: 94% 92% 95% 91%  Weight:      Height:        Intake/Output Summary (Last 24 hours) at 08/08/2020 1757 Last data filed at 08/08/2020 1700 Gross per 24 hour  Intake 386.71 ml  Output 5300 ml  Net -4913.29 ml    Filed Weights   08/04/20 1608  Weight: 73.5 kg   Examination: Physical Exam:  Constitutional: WN/WD overweight Caucasian male currently in NAD and appears calm but slightly uncomfortable Eyes: Lids and conjunctivae normal, sclerae anicteric  ENMT: External Ears, Nose appear normal. Grossly normal hearing.  Neck: Appears normal, supple, no cervical masses, normal ROM, no appreciable thyromegaly Respiratory: Diminished to  auscultation bilaterally with coarse breath sounds and some crackles but no appreciable wheezing, rales, rhonchi. Normal respiratory effort and patient is not tachypenic. No accessory muscle use.  Wearing supplemental oxygen via nasal cannula Cardiovascular: Irregularly irregular and tachycardic, no murmurs / rubs / gallops. S1 and S2 auscultated.  Has 1+ lower extremity edema. Abdomen: Soft, mildly tender to palpate.  Distended secondary body habitus. Bowel sounds positive.  GU: Deferred. Musculoskeletal: No clubbing / cyanosis of digits/nails. No joint deformity upper and lower extremities.   Skin: No rashes, lesions, ulcers on limited skin evaluation. No induration; Warm and dry.  Neurologic: CN 2-12 grossly intact with no focal deficits. Romberg sign cerebellar reflexes not assessed.  Psychiatric: Normal judgment and insight. Alert and oriented x 3. Normal mood and appropriate affect.   Data Reviewed: I have personally reviewed following labs and imaging studies  CBC: Recent Labs  Lab  08/04/20 1704 08/05/20 0429 08/06/20 0651 08/07/20 0458 08/08/20 0257  WBC 17.2* 14.7* 14.6* 12.4* 13.9*  NEUTROABS 15.0*  --  12.1* 9.5* 11.0*  HGB 12.6* 10.0* 11.0* 10.5* 11.8*  HCT 39.5 30.9* 34.8* 32.3* 35.7*  MCV 94.3 93.9 94.3 94.2 90.6  PLT 297 218 253 260 160    Basic Metabolic Panel: Recent Labs  Lab 08/04/20 1704 08/05/20 0429 08/06/20 0651 08/07/20 0458 08/08/20 0257  NA 136 136 136 136 135  K 4.0 3.5 3.3* 3.4* 3.0*  CL 100 107 107 107 102  CO2 24 21* 20* 23 22  GLUCOSE 142* 148* 131* 123* 142*  BUN _0 CREATININE 0.89 0.67 0.71 0.60* 0.57*  CALCIUM 9.6 8.0* 8.0* 8.1* 8.5*  MG  --   --  1.9 2.1 2.0  PHOS  --   --  1.6* 1.3* 2.0*    GFR: Estimated Creatinine Clearance: 58.3 mL/min (A) (by C-G formula based on SCr of 0.57 mg/dL (L)). Liver Function Tests: Recent Labs  Lab 08/04/20 1704 08/05/20 0429 08/06/20 0651 08/07/20 0458 08/08/20 0257  AST 192* 95* 49* 27 134*  ALT 220* 151* 111* 79* 117*  ALKPHOS 263* 203* 203* 182* 559*  BILITOT 2.4* 2.3* 1.6* 1.3* 3.9*  PROT 7.1 5.8* 6.4* 6.3* 6.6  ALBUMIN 3.7 2.9* 3.0* 3.1* 3.3*    Recent Labs  Lab 08/04/20 1704  LIPASE 36    No results for input(s): AMMONIA in the last 168 hours. Coagulation Profile: Recent Labs  Lab 08/04/20 1858  INR 1.4*    Cardiac Enzymes: No results for input(s): CKTOTAL, CKMB, CKMBINDEX, TROPONINI in the last 168 hours. BNP (last 3 results) No results for input(s): PROBNP in the last 8760 hours. HbA1C: No results for input(s): HGBA1C in the last 72 hours. CBG: Recent Labs  Lab 08/07/20 1951  GLUCAP 150*   Lipid Profile: No results for input(s): CHOL, HDL, LDLCALC, TRIG, CHOLHDL, LDLDIRECT in the last 72 hours. Thyroid Function Tests: No results for input(s): TSH, T4TOTAL, FREET4, T3FREE, THYROIDAB in the last 72 hours. Anemia Panel: Recent Labs    08/06/20 0651  VITAMINB12 1,477*  FOLATE 14.1  FERRITIN 333  TIBC 241*  IRON 17*  RETICCTPCT  1.4    Sepsis Labs: Recent Labs  Lab 08/04/20 1704 08/04/20 1826 08/04/20 2200 08/05/20 0100  LATICACIDVEN 3.4* 2.5* 5.3* 2.3*     Recent Results (from the past 240 hour(s))  Blood Culture (routine x 2)     Status: Abnormal   Collection Time: 08/04/20  7:18 PM   Specimen: BLOOD  Result Value Ref  Range Status   Specimen Description   Final    BLOOD RIGHT ANTECUBITAL Performed at [facility] [address] [place], [place] [address]    Special Requests   Final    BOTTLES DRAWN AEROBIC AND ANAEROBIC Blood Culture adequate volume Performed at [facility] [address] [place], [place] [address]    Culture  Setup Time   Final    GRAM NEGATIVE RODS IN BOTH AEROBIC AND ANAEROBIC BOTTLES CRITICAL VALUE NOTED.  VALUE IS CONSISTENT WITH PREVIOUSLY REPORTED AND CALLED VALUE.    Culture (A)  Final    KLEBSIELLA PNEUMONIAE SUSCEPTIBILITIES PERFORMED ON PREVIOUS CULTURE WITHIN THE LAST 5 DAYS. Performed at [facility], [place] [address] [place], [place] [address]    Report Status 08/07/2020 FINAL  Final  Blood Culture (routine x 2)     Status: Abnormal   Collection Time: 08/04/20  7:25 PM   Specimen: BLOOD  Result Value Ref Range Status   Specimen Description   Final    BLOOD WRIST LEFT Performed at [facility] [address] [place], [place] [address]    Special Requests   Final    BOTTLES DRAWN AEROBIC AND ANAEROBIC Blood Culture adequate volume Performed at [facility] [address], [place], [place] [address]    Culture  Setup Time   Final    GRAM NEGATIVE RODS IN BOTH AEROBIC AND ANAEROBIC BOTTLES CRITICAL RESULT CALLED TO, READ BACK BY AND VERIFIED WITH: CHRISTINE SHADE PHARMD _0  08/05/20 EB Performed at [facility], [place] [address], [place], [place] [address]    Culture KLEBSIELLA PNEUMONIAE (A)  Final   Report Status 08/07/2020 FINAL  Final   Organism ID, Bacteria  KLEBSIELLA PNEUMONIAE  Final      Susceptibility   Klebsiella pneumoniae - MIC*    AMPICILLIN RESISTANT Resistant     CEFAZOLIN <=4 SENSITIVE Sensitive     CEFEPIME <=0.12 SENSITIVE Sensitive     CEFTAZIDIME <=1 SENSITIVE Sensitive     CEFTRIAXONE <=0.25 SENSITIVE Sensitive     CIPROFLOXACIN <=0.25 SENSITIVE Sensitive     GENTAMICIN <=1 SENSITIVE Sensitive     IMIPENEM <=0.25 SENSITIVE Sensitive     TRIMETH/SULFA <=20 SENSITIVE Sensitive     AMPICILLIN/SULBACTAM 4 SENSITIVE Sensitive     PIP/TAZO <=4 SENSITIVE Sensitive     * KLEBSIELLA PNEUMONIAE  Blood Culture ID Panel (Reflexed)     Status: Abnormal   Collection Time: 08/04/20  7:25 PM  Result Value Ref Range Status   Enterococcus faecalis NOT DETECTED NOT DETECTED Final   Enterococcus Faecium NOT DETECTED NOT DETECTED Final   Listeria monocytogenes NOT DETECTED NOT DETECTED Final   Staphylococcus species NOT DETECTED NOT DETECTED Final   Staphylococcus aureus (BCID) NOT DETECTED NOT DETECTED Final   Staphylococcus epidermidis NOT DETECTED NOT DETECTED Final   Staphylococcus lugdunensis NOT DETECTED NOT DETECTED Final   Streptococcus species NOT DETECTED NOT DETECTED Final   Streptococcus agalactiae NOT DETECTED NOT DETECTED Final   Streptococcus pneumoniae NOT DETECTED NOT DETECTED Final   Streptococcus pyogenes NOT DETECTED NOT DETECTED Final   A.calcoaceticus-baumannii NOT DETECTED NOT DETECTED Final   Bacteroides fragilis NOT DETECTED NOT DETECTED Final   Enterobacterales DETECTED (A) NOT DETECTED Final    Comment: Enterobacterales represent a large order of gram negative bacteria, not a single organism. CRITICAL RESULT CALLED TO, READ BACK BY AND VERIFIED WITH: CHRISTINE SHADE PHARMD _1  08/05/20 EB    Enterobacter  cloacae complex NOT DETECTED NOT DETECTED Final   Escherichia coli NOT DETECTED NOT DETECTED Final   Klebsiella aerogenes NOT DETECTED NOT DETECTED Final   Klebsiella oxytoca NOT DETECTED NOT DETECTED Final    Klebsiella pneumoniae DETECTED (A) NOT DETECTED Final    Comment: CRITICAL RESULT CALLED TO, READ BACK BY AND VERIFIED WITH: CHRISTINE SHADE PHARMD _0  08/05/20 EB    Proteus species NOT DETECTED NOT DETECTED Final   Salmonella species NOT DETECTED NOT DETECTED Final   Serratia marcescens NOT DETECTED NOT DETECTED Final   Haemophilus influenzae NOT DETECTED NOT DETECTED Final   Neisseria meningitidis NOT DETECTED NOT DETECTED Final   Pseudomonas aeruginosa NOT DETECTED NOT DETECTED Final   Stenotrophomonas maltophilia NOT DETECTED NOT DETECTED Final   Candida albicans NOT DETECTED NOT DETECTED Final   Candida auris NOT DETECTED NOT DETECTED Final   Candida glabrata NOT DETECTED NOT DETECTED Final   Candida krusei NOT DETECTED NOT DETECTED Final   Candida parapsilosis NOT DETECTED NOT DETECTED Final   Candida tropicalis NOT DETECTED NOT DETECTED Final   Cryptococcus neoformans/gattii NOT DETECTED NOT DETECTED Final   CTX-M ESBL NOT DETECTED NOT DETECTED Final   Carbapenem resistance IMP NOT DETECTED NOT DETECTED Final   Carbapenem resistance KPC NOT DETECTED NOT DETECTED Final   Carbapenem resistance NDM NOT DETECTED NOT DETECTED Final   Carbapenem resist OXA 48 LIKE NOT DETECTED NOT DETECTED Final   Carbapenem resistance VIM NOT DETECTED NOT DETECTED Final    Comment: Performed at [facility], [address], [place], [place] [address]  Resp Panel by RT-PCR (Flu A&B, Covid) Nasopharyngeal Swab     Status: None   Collection Time: 08/04/20  7:45 PM   Specimen: Nasopharyngeal Swab; Nasopharyngeal(NP) swabs in vial transport medium  Result Value Ref Range Status   SARS Coronavirus 2 by RT PCR NEGATIVE NEGATIVE Final    Comment: (NOTE) SARS-CoV-2 target nucleic acids are NOT DETECTED.  The SARS-CoV-2 RNA is generally detectable in upper respiratory specimens during the acute phase of infection. The lowest concentration of SARS-CoV-2 viral copies this assay can detect is 138  copies/mL. A negative result does not preclude SARS-Cov-2 infection and should not be used as the sole basis for treatment or other patient management decisions. A negative result may occur with  improper specimen collection/handling, submission of specimen other than nasopharyngeal swab, presence of viral mutation(s) within the areas targeted by this assay, and inadequate number of viral copies(<138 copies/mL). A negative result must be combined with clinical observations, patient history, and epidemiological information. The expected result is Negative.  Fact Sheet for Patients:  EntrepreneurPulse.com.au  Fact Sheet for Healthcare Providers:  IncredibleEmployment.be  This test is no t yet approved or cleared by the [place] FDA and  has been authorized for detection and/or diagnosis of SARS-CoV-2 by FDA under an Emergency Use Authorization (EUA). This EUA will remain  in effect (meaning this test can be used) for the duration of the COVID-19 declaration under Section 564(b)(1) of the Act, 21 U.S.C.section 360bbb-3(b)(1), unless the authorization is terminated  or revoked sooner.       Influenza A by PCR NEGATIVE NEGATIVE Final   Influenza B by PCR NEGATIVE NEGATIVE Final    Comment: (NOTE) The Xpert Xpress SARS-CoV-2/FLU/RSV plus assay is intended as an aid in the diagnosis of influenza from Nasopharyngeal swab specimens and should not be used as a sole basis for treatment. Nasal washings and aspirates are unacceptable for Xpert Xpress SARS-CoV-2/FLU/RSV testing.  Fact Sheet for Patients: EntrepreneurPulse.com.au  Fact Sheet for Healthcare Providers: IncredibleEmployment.be  This test is not yet approved or cleared by the [place] FDA and has been authorized for detection and/or diagnosis of SARS-CoV-2 by FDA under an Emergency Use Authorization (EUA). This EUA will remain in effect (meaning  this test can be used) for the duration of the COVID-19 declaration under Section 564(b)(1) of the Act, 21 U.S.C. section 360bbb-3(b)(1), unless the authorization is terminated or revoked.  Performed at [facility], [place] [address] [place], [place] [address]   C Difficile Quick Screen w PCR reflex     Status: None   Collection Time: 08/06/20  1:41 PM   Specimen: STOOL  Result Value Ref Range Status   C Diff antigen NEGATIVE NEGATIVE Final   C Diff toxin NEGATIVE NEGATIVE Final   C Diff interpretation NEGATIVE  Final    Comment: Performed at [facility], [place] [address] [place], [place] [address]  MRSA Next Gen by PCR, Nasal     Status: None   Collection Time: 08/07/20  9:00 PM   Specimen: Nasal Mucosa; Nasal Swab  Result Value Ref Range Status   MRSA by PCR Next Gen NOT DETECTED NOT DETECTED Final    Comment: (NOTE) The GeneXpert MRSA Assay (FDA approved for NASAL specimens only), is one component of a comprehensive MRSA colonization surveillance program. It is not intended to diagnose MRSA infection nor to guide or monitor treatment for MRSA infections. Test performance is not FDA approved in patients less than 31 years old. Performed at [facility], [place] [address] [place], [place] [address]       RN Pressure Injury Documentation:     Estimated body mass index is 29.63 kg/m as calculated from the following:   Height as of this encounter: 5' 2" (1.575 m).   Weight as of this encounter: 73.5 kg.  Malnutrition Type:   Malnutrition Characteristics:   Nutrition Interventions:     Radiology Studies: DG CHEST PORT 1 VIEW  Result Date: 08/08/2020 CLINICAL DATA:  Recent diagnosis of pancreatic cancer. Fever malaise. Nausea and vomiting. EXAM: PORTABLE CHEST 1 VIEW COMPARISON:  Chest x-ray 08/07/2020. FINDINGS: Cardiomegaly with bilateral interstitial prominence in small bilateral pleural effusions again noted.  Findings again consistent with CHF. Pneumonitis cannot be excluded. No pneumothorax. IMPRESSION: Cardiomegaly with bilateral interstitial prominence and small bilateral pleural effusions again noted. Findings again consistent with CHF. Pneumonitis cannot be excluded. Similar findings noted on prior study of 08/07/2020. Electronically Signed   By: Marcello Moores  Register   On: 08/08/2020 08:53   DG Chest Port 1 View  Result Date: 08/07/2020 CLINICAL DATA:  Acute respiratory distress. EXAM: PORTABLE CHEST 1 VIEW COMPARISON:  Radiograph 08/04/2020. Lung bases from abdominal CT 08/04/2020. FINDINGS: Increasing hazy opacity at the lung bases consistent with increasing pleural effusions, right greater than left. There is septal thickening and alveolar opacities consistent with pulmonary edema. Mild cardiomegaly with equivocal increase. No pneumothorax or confluent airspace disease. Bilateral glenohumeral arthropathy degenerative change of the shoulders. IMPRESSION: Findings consistent with CHF, new from prior exam. Electronically Signed   By: Keith Rake M.D.   On: 08/07/2020 20:03   ECHOCARDIOGRAM COMPLETE  Result Date: 08/08/2020    ECHOCARDIOGRAM REPORT   Patient Name:   Barry Contreras Date of Exam: 08/08/2020 Medical Rec #:  [id number]      Height:       62.0 in Accession #:    [id number]  Weight:       162.0 lb Date of Birth:  Jun 21, 1931      BSA:          1.748 m Patient Age:    17 years       BP:           128/92 mmHg Patient Gender: M              HR:           76 bpm. Exam Location:  Inpatient Procedure: 2D Echo, Cardiac Doppler and Color Doppler Indications:    Dyspnea  History:        Patient has prior history of Echocardiogram examinations, most                 recent 12/12/2016. Arrythmias:Atrial Fibrillation,                 Signs/Symptoms:Murmur; Risk Factors:Hypertension.  Sonographer:    Luisa Hart RDCS Referring Phys: [id number] Georgina Quint LATIF [place]  1. Left ventricular ejection fraction, by  estimation, is 70 to 75%. The left ventricle has hyperdynamic function. The left ventricle has no regional wall motion abnormalities. Left ventricular diastolic function could not be evaluated.  2. Right ventricular systolic function is normal. The right ventricular size is normal. There is mildly elevated pulmonary artery systolic pressure.  3. Left atrial size was mildly dilated.  4. The mitral valve is normal in structure. Mild mitral valve regurgitation. No evidence of mitral stenosis.  5. The aortic valve is tricuspid. There is mild calcification of the aortic valve. There is mild thickening of the aortic valve. Aortic valve regurgitation is mild to moderate. Mild to moderate aortic valve sclerosis/calcification is present, without any evidence of aortic stenosis.  6. There is mild dilatation of the ascending aorta, measuring 39 mm.  7. The inferior vena cava is normal in size with greater than 50% respiratory variability, suggesting right atrial pressure of 3 mmHg. Comparison(s): Prior images reviewed side by side. Degenerative aortic valve changes and the degree of aortic insufficiency is slightly worse. The ascending aorta is not seen as clearly on the current study and was previously measured as larger. FINDINGS  Left Ventricle: Left ventricular ejection fraction, by estimation, is 70 to 75%. The left ventricle has hyperdynamic function. The left ventricle has no regional wall motion abnormalities. The left ventricular internal cavity size was normal in size. There is no left ventricular hypertrophy. Left ventricular diastolic function could not be evaluated due to atrial fibrillation. Left ventricular diastolic function could not be evaluated. Right Ventricle: The right ventricular size is normal. No increase in right ventricular wall thickness. Right ventricular systolic function is normal. There is mildly elevated pulmonary artery systolic pressure. The tricuspid regurgitant velocity is 3.03  m/s, and  with an assumed right atrial pressure of 3 mmHg, the estimated right ventricular systolic pressure is 15.0 mmHg. Left Atrium: Left atrial size was mildly dilated. Right Atrium: Right atrial size was normal in size. Pericardium: Trivial pericardial effusion is present. Mitral Valve: The mitral valve is normal in structure. Mild mitral valve regurgitation. No evidence of mitral valve stenosis. Tricuspid Valve: The tricuspid valve is normal in structure. Tricuspid valve regurgitation is mild . No evidence of tricuspid stenosis. Aortic Valve: The aortic valve is tricuspid. There is mild calcification of the aortic valve. There is mild thickening of the aortic valve. Aortic valve regurgitation is mild to moderate. Aortic regurgitation PHT measures 339 msec. Mild to moderate aortic  valve sclerosis/calcification is present, without any evidence of aortic stenosis. Aortic valve mean gradient measures 8.2 mmHg. Aortic valve peak gradient measures 15.7 mmHg. Aortic valve area, by VTI measures 1.99 cm. Pulmonic Valve: The pulmonic valve was normal in structure. Pulmonic valve regurgitation is trivial. No evidence of pulmonic stenosis. Aorta: The aortic root is normal in size and structure. There is mild dilatation of the ascending aorta, measuring 39 mm. Venous: The inferior vena cava is normal in size with greater than 50% respiratory variability, suggesting right atrial pressure of 3 mmHg. IAS/Shunts: No atrial level shunt detected by color flow Doppler.  LEFT VENTRICLE PLAX 2D LVIDd:         3.60 cm LVIDs:         1.80 cm LV PW:         1.40 cm LV IVS:        1.10 cm LVOT diam:     2.30 cm LV SV:         64 LV SV Index:   37 LVOT Area:     4.15 cm  LV Volumes (MOD) LV vol d, MOD A2C: 38.7 ml LV vol d, MOD A4C: 42.3 ml LV vol s, MOD A2C: 13.2 ml LV vol s, MOD A4C: 14.4 ml LV SV MOD A2C:     25.5 ml LV SV MOD A4C:     42.3 ml LV SV MOD BP:      26.8 ml RIGHT VENTRICLE RV Basal diam:  4.30 cm RV Mid diam:    2.40 cm RV S  prime:     10.60 cm/s TAPSE (M-mode): 1.1 cm LEFT ATRIUM             Index       RIGHT ATRIUM           Index LA Vol (A2C):   37.3 ml 21.34 ml/m RA Area:     17.70 cm LA Vol (A4C):   62.3 ml 35.64 ml/m RA Volume:   45.70 ml  26.15 ml/m LA Biplane Vol: 51.2 ml 29.29 ml/m  AORTIC VALVE                    PULMONIC VALVE AV Area (Vmax):    2.18 cm     PV Vmax:       0.86 m/s AV Area (Vmean):   2.25 cm     PV Vmean:      57.300 cm/s AV Area (VTI):     1.99 cm     PV VTI:        0.155 m AV Vmax:           198.25 cm/s  PV Peak grad:  3.0 mmHg AV Vmean:          127.375 cm/s PV Mean grad:  2.0 mmHg AV VTI:            0.324 m AV Peak Grad:      15.7 mmHg AV Mean Grad:      8.2 mmHg LVOT Vmax:         104.00 cm/s LVOT Vmean:        69.000 cm/s LVOT VTI:          0.155 m LVOT/AV VTI ratio: 0.48 AI PHT:            339 msec  AORTA Ao Root diam: 3.30 cm Ao Asc diam:  3.87 cm MR Peak grad:    62.1 mmHg   TRICUSPID  VALVE MR Vmax:         394.00 cm/s TR Peak grad:   36.7 mmHg MR PISA:         1.01 cm    TR Vmax:        303.00 cm/s MR PISA Eff ROA: 6 mm MR PISA Radius:  0.40 cm     SHUNTS                              Systemic VTI:  0.16 m                              Systemic Diam: 2.30 cm Mihai Croitoru MD Electronically signed by Sanda Klein MD Signature Date/Time: 08/08/2020/3:15:56 PM    Final     Scheduled Meds:  apixaban  5 mg Oral BID   Chlorhexidine Gluconate Cloth  6 each Topical Daily   diltiazem  120 mg Oral Daily   mouth rinse  15 mL Mouth Rinse BID   melatonin  3 mg Oral Once   metoprolol tartrate  50 mg Oral BID   phosphorus  500 mg Oral BID   potassium chloride  40 mEq Oral BID   Continuous Infusions:  cefTRIAXone (ROCEPHIN)  IV Stopped (08/08/20 1411)   diltiazem (CARDIZEM) infusion 10 mg/hr (08/08/20 1700)   metronidazole Stopped (08/08/20 1513)    LOS: 3 days   Kerney Elbe, DO [facility] PAGER is on AMION  If 7PM-7AM, please contact night-coverage [email or web address]

## 2020-08-08 NOTE — Progress Notes (Signed)
*  PRELIMINARY RESULTS* Echocardiogram 2D Echocardiogram has been performed.  Luisa Hart RDCS 08/08/2020, 1:55 PM

## 2020-08-09 ENCOUNTER — Other Ambulatory Visit: Payer: Self-pay | Admitting: Radiology

## 2020-08-09 ENCOUNTER — Inpatient Hospital Stay (HOSPITAL_COMMUNITY): Payer: Medicare PPO

## 2020-08-09 DIAGNOSIS — J9601 Acute respiratory failure with hypoxia: Secondary | ICD-10-CM

## 2020-08-09 DIAGNOSIS — D649 Anemia, unspecified: Secondary | ICD-10-CM

## 2020-08-09 DIAGNOSIS — E876 Hypokalemia: Secondary | ICD-10-CM

## 2020-08-09 DIAGNOSIS — R7881 Bacteremia: Secondary | ICD-10-CM | POA: Diagnosis not present

## 2020-08-09 DIAGNOSIS — I5031 Acute diastolic (congestive) heart failure: Secondary | ICD-10-CM

## 2020-08-09 DIAGNOSIS — I4821 Permanent atrial fibrillation: Secondary | ICD-10-CM | POA: Diagnosis not present

## 2020-08-09 DIAGNOSIS — A419 Sepsis, unspecified organism: Secondary | ICD-10-CM | POA: Diagnosis not present

## 2020-08-09 LAB — CBC WITH DIFFERENTIAL/PLATELET
Abs Immature Granulocytes: 0.08 10*3/uL — ABNORMAL HIGH (ref 0.00–0.07)
Basophils Absolute: 0 10*3/uL (ref 0.0–0.1)
Basophils Relative: 0 %
Eosinophils Absolute: 0.1 10*3/uL (ref 0.0–0.5)
Eosinophils Relative: 1 %
HCT: 35.6 % — ABNORMAL LOW (ref 39.0–52.0)
Hemoglobin: 11.6 g/dL — ABNORMAL LOW (ref 13.0–17.0)
Immature Granulocytes: 1 %
Lymphocytes Relative: 13 %
Lymphs Abs: 1.4 10*3/uL (ref 0.7–4.0)
MCH: 29.8 pg (ref 26.0–34.0)
MCHC: 32.6 g/dL (ref 30.0–36.0)
MCV: 91.5 fL (ref 80.0–100.0)
Monocytes Absolute: 1.1 10*3/uL — ABNORMAL HIGH (ref 0.1–1.0)
Monocytes Relative: 10 %
Neutro Abs: 8.2 10*3/uL — ABNORMAL HIGH (ref 1.7–7.7)
Neutrophils Relative %: 75 %
Platelets: 318 10*3/uL (ref 150–400)
RBC: 3.89 MIL/uL — ABNORMAL LOW (ref 4.22–5.81)
RDW: 14.6 % (ref 11.5–15.5)
WBC: 10.8 10*3/uL — ABNORMAL HIGH (ref 4.0–10.5)
nRBC: 0 % (ref 0.0–0.2)

## 2020-08-09 LAB — TSH: TSH: 1.765 u[IU]/mL (ref 0.350–4.500)

## 2020-08-09 LAB — COMPREHENSIVE METABOLIC PANEL
ALT: 124 U/L — ABNORMAL HIGH (ref 0–44)
AST: 127 U/L — ABNORMAL HIGH (ref 15–41)
Albumin: 3.1 g/dL — ABNORMAL LOW (ref 3.5–5.0)
Alkaline Phosphatase: 549 U/L — ABNORMAL HIGH (ref 38–126)
Anion gap: 13 (ref 5–15)
BUN: 15 mg/dL (ref 8–23)
CO2: 24 mmol/L (ref 22–32)
Calcium: 8.6 mg/dL — ABNORMAL LOW (ref 8.9–10.3)
Chloride: 100 mmol/L (ref 98–111)
Creatinine, Ser: 0.57 mg/dL — ABNORMAL LOW (ref 0.61–1.24)
GFR, Estimated: >60 mL/min (ref >60)
Glucose, Bld: 130 mg/dL — ABNORMAL HIGH (ref 70–99)
Potassium: 3.6 mmol/L (ref 3.5–5.1)
Sodium: 137 mmol/L (ref 135–145)
Total Bilirubin: 5.7 mg/dL — ABNORMAL HIGH (ref 0.3–1.2)
Total Protein: 6.2 g/dL — ABNORMAL LOW (ref 6.5–8.1)

## 2020-08-09 LAB — PHOSPHORUS: Phosphorus: 3.2 mg/dL (ref 2.5–4.6)

## 2020-08-09 LAB — MAGNESIUM: Magnesium: 1.9 mg/dL (ref 1.7–2.4)

## 2020-08-09 MED ORDER — FUROSEMIDE 10 MG/ML IJ SOLN
40.0000 mg | Freq: Once | INTRAMUSCULAR | Status: AC
  Administered 2020-08-09: 40 mg via INTRAVENOUS
  Filled 2020-08-09: qty 4

## 2020-08-09 MED ORDER — LABETALOL HCL 5 MG/ML IV SOLN
10.0000 mg | INTRAVENOUS | Status: DC | PRN
  Administered 2020-08-09 – 2020-08-10 (×3): 10 mg via INTRAVENOUS
  Filled 2020-08-09 (×3): qty 4

## 2020-08-09 MED ORDER — HYDRALAZINE HCL 20 MG/ML IJ SOLN: 10.0000 mg | INTRAMUSCULAR | Status: DC | PRN

## 2020-08-09 MED ORDER — DILTIAZEM HCL ER COATED BEADS 240 MG PO CP24
240.0000 mg | ORAL_CAPSULE | Freq: Every day | ORAL | Status: DC
  Administered 2020-08-09 – 2020-08-16 (×8): 240 mg via ORAL
  Filled 2020-08-09 (×3): qty 1
  Filled 2020-08-09 (×3): qty 2
  Filled 2020-08-09: qty 1
  Filled 2020-08-09: qty 2

## 2020-08-09 MED ORDER — CEFAZOLIN SODIUM-DEXTROSE 2-4 GM/100ML-% IV SOLN
2.0000 g | Freq: Three times a day (TID) | INTRAVENOUS | Status: DC
  Filled 2020-08-09: qty 100

## 2020-08-09 NOTE — Progress Notes (Addendum)
Progress Note  Patient Name: Barry Contreras. Date of Encounter: 08/09/2020  [facility] Cardiologist: Elouise Munroe, MD   Subjective   No acute overnight events. Breathing better - not quite back to baseline but much better. No chest pain. No palpitations. He has chronic right sided abdominal pain and feels like his pain was better managed on home pain med regimen.  Inpatient Medications    Scheduled Meds:  apixaban  5 mg Oral BID   Chlorhexidine Gluconate Cloth  6 each Topical Daily   diltiazem  120 mg Oral Daily   mouth rinse  15 mL Mouth Rinse BID   melatonin  3 mg Oral Once   metoprolol tartrate  50 mg Oral BID   Continuous Infusions:  cefTRIAXone (ROCEPHIN)  IV Stopped (08/08/20 1411)   diltiazem (CARDIZEM) infusion 10 mg/hr (08/09/20 0704)   metronidazole Stopped (08/09/20 0226)   PRN Meds: acetaminophen, hydrALAZINE, labetalol, metoprolol tartrate, ondansetron (ZOFRAN) IV, traMADol, traZODone   Vital Signs    Vitals:   08/09/20 0500 08/09/20 0600 08/09/20 0700 08/09/20 0738  BP: (!) 171/107 (!) 146/95 (!) 173/111   Pulse: (!) 109 (!) 103 (!) 120   Resp: (!) '22 16 19   '$ Temp:    98.5 F (36.9 C)  TempSrc:    Oral  SpO2: (!) 85% 95% 95%   Weight:      Height:        Intake/Output Summary (Last 24 hours) at 08/09/2020 0751 Last data filed at 08/09/2020 0600 Gross per 24 hour  Intake 337.55 ml  Output 3100 ml  Net -2762.45 ml   Last 3 Weights 08/04/2020 07/24/2020 07/13/2020  Weight (lbs) 162 lb 162 lb 3.2 oz 161 lb 6.4 oz  Weight (kg) 73.483 kg 73.573 kg 73.211 kg      Telemetry    Atrial fibrillation with rates in the 80s to 90s overnight but in the 100s to 110s this morning (briefly in the 120s) - Personally Reviewed  ECG    No new EKG today. - Personally Reviewed  Physical Exam   GEN: No acute distress.   Neck: No JVD. Cardiac: Tachycardic with irregularly irregular rhythm. Soft II/VI systolic murmur. No rubs or gallops.  Respiratory: No  increased work of breathing. Decreased breath sounds in bilateral bases. No wheezes, rhonchi, or rales appreciated. GI: Soft, non-distended, and non-tender to palpation. MS: No lower extremity edema. No deformity. Skin: Warm and dry. Neuro:  No focal deficits. Psych: Normal affect.   Labs    High Sensitivity Troponin:   Recent Labs  Lab 08/07/20 2045 08/07/20 2214  TROPONINIHS 12 12      Chemistry Recent Labs  Lab 08/07/20 0458 08/08/20 0257 08/09/20 0247  NA 136 135 137  K 3.4* 3.0* 3.6  CL 107 102 100  CO2 '23 22 24  '$ GLUCOSE 123* 142* 130*  BUN '10 9 15  '$ CREATININE 0.60* 0.57* 0.57*  CALCIUM 8.1* 8.5* 8.6*  PROT 6.3* 6.6 6.2*  ALBUMIN 3.1* 3.3* 3.1*  AST 27 134* 127*  ALT 79* 117* 124*  ALKPHOS 182* 559* 549*  BILITOT 1.3* 3.9* 5.7*  GFRNONAA >60 >60 >60  ANIONGAP '6 11 13     '$ Hematology Recent Labs  Lab 08/07/20 0458 08/08/20 0257 08/09/20 0247  WBC 12.4* 13.9* 10.8*  RBC 3.43* 3.94* 3.89*  HGB 10.5* 11.8* 11.6*  HCT 32.3* 35.7* 35.6*  MCV 94.2 90.6 91.5  MCH 30.6 29.9 29.8  MCHC 32.5 33.1 32.6  RDW 14.4  14.5 14.6  PLT 260 332 318    BNP Recent Labs  Lab 08/07/20 2045  BNP 371.1*     DDimer No results for input(s): DDIMER in the last 168 hours.   Radiology    DG CHEST PORT 1 VIEW  Result Date: 08/08/2020 CLINICAL DATA:  Recent diagnosis of pancreatic cancer. Fever malaise. Nausea and vomiting. EXAM: PORTABLE CHEST 1 VIEW COMPARISON:  Chest x-ray 08/07/2020. FINDINGS: Cardiomegaly with bilateral interstitial prominence in small bilateral pleural effusions again noted. Findings again consistent with CHF. Pneumonitis cannot be excluded. No pneumothorax. IMPRESSION: Cardiomegaly with bilateral interstitial prominence and small bilateral pleural effusions again noted. Findings again consistent with CHF. Pneumonitis cannot be excluded. Similar findings noted on prior study of 08/07/2020. Electronically Signed   By: Marcello Moores  Register   On: 08/08/2020  08:53   DG Chest Port 1 View  Result Date: 08/07/2020 CLINICAL DATA:  Acute respiratory distress. EXAM: PORTABLE CHEST 1 VIEW COMPARISON:  Radiograph 08/04/2020. Lung bases from abdominal CT 08/04/2020. FINDINGS: Increasing hazy opacity at the lung bases consistent with increasing pleural effusions, right greater than left. There is septal thickening and alveolar opacities consistent with pulmonary edema. Mild cardiomegaly with equivocal increase. No pneumothorax or confluent airspace disease. Bilateral glenohumeral arthropathy degenerative change of the shoulders. IMPRESSION: Findings consistent with CHF, new from prior exam. Electronically Signed   By: Keith Rake M.D.   On: 08/07/2020 20:03   ECHOCARDIOGRAM COMPLETE  Result Date: 08/08/2020    ECHOCARDIOGRAM REPORT   Patient Name:   Barry Contreras Date of Exam: 08/08/2020 Medical Rec #:  [id number]      Height:       62.0 in Accession #:    [id number]     Weight:       162.0 lb Date of Birth:  [birth year]      BSA:          1.748 m Patient Age:    87 years       BP:           128/92 mmHg Patient Gender: M              HR:           76 bpm. Exam Location:  Inpatient Procedure: 2D Echo, Cardiac Doppler and Color Doppler Indications:    Dyspnea  History:        Patient has prior history of Echocardiogram examinations, most                 recent 12/12/2016. Arrythmias:Atrial Fibrillation,                 Signs/Symptoms:Murmur; Risk Factors:Hypertension.  Sonographer:    Luisa Hart RDCS Referring Phys: [id number] Georgina Quint LATIF [place]  1. Left ventricular ejection fraction, by estimation, is 70 to 75%. The left ventricle has hyperdynamic function. The left ventricle has no regional wall motion abnormalities. Left ventricular diastolic function could not be evaluated.  2. Right ventricular systolic function is normal. The right ventricular size is normal. There is mildly elevated pulmonary artery systolic pressure.  3. Left atrial size was mildly  dilated.  4. The mitral valve is normal in structure. Mild mitral valve regurgitation. No evidence of mitral stenosis.  5. The aortic valve is tricuspid. There is mild calcification of the aortic valve. There is mild thickening of the aortic valve. Aortic valve regurgitation is mild to moderate. Mild to moderate aortic valve sclerosis/calcification is present, without any evidence of aortic stenosis.  6. There is mild dilatation of the ascending aorta, measuring 39 mm.  7. The inferior vena cava is normal in size with greater than 50% respiratory variability, suggesting right atrial pressure of 3 mmHg. Comparison(s): Prior images reviewed side by side. Degenerative aortic valve changes and the degree of aortic insufficiency is slightly worse. The ascending aorta is not seen as clearly on the current study and was previously measured as larger. FINDINGS  Left Ventricle: Left ventricular ejection fraction, by estimation, is 70 to 75%. The left ventricle has hyperdynamic function. The left ventricle has no regional wall motion abnormalities. The left ventricular internal cavity size was normal in size. There is no left ventricular hypertrophy. Left ventricular diastolic function could not be evaluated due to atrial fibrillation. Left ventricular diastolic function could not be evaluated. Right Ventricle: The right ventricular size is normal. No increase in right ventricular wall thickness. Right ventricular systolic function is normal. There is mildly elevated pulmonary artery systolic pressure. The tricuspid regurgitant velocity is 3.03  m/s, and with an assumed right atrial pressure of 3 mmHg, the estimated right ventricular systolic pressure is [id number]. Left Atrium: Left atrial size was mildly dilated. Right Atrium: Right atrial size was normal in size. Pericardium: Trivial pericardial effusion is present. Mitral Valve: The mitral valve is normal in structure. Mild mitral valve regurgitation. No evidence of mitral  valve stenosis. Tricuspid Valve: The tricuspid valve is normal in structure. Tricuspid valve regurgitation is mild . No evidence of tricuspid stenosis. Aortic Valve: The aortic valve is tricuspid. There is mild calcification of the aortic valve. There is mild thickening of the aortic valve. Aortic valve regurgitation is mild to moderate. Aortic regurgitation PHT measures 339 msec. Mild to moderate aortic valve sclerosis/calcification is present, without any evidence of aortic stenosis. Aortic valve mean gradient measures 8.2 mmHg. Aortic valve peak gradient measures 15.7 mmHg. Aortic valve area, by VTI measures 1.99 cm. Pulmonic Valve: The pulmonic valve was normal in structure. Pulmonic valve regurgitation is trivial. No evidence of pulmonic stenosis. Aorta: The aortic root is normal in size and structure. There is mild dilatation of the ascending aorta, measuring 39 mm. Venous: The inferior vena cava is normal in size with greater than 50% respiratory variability, suggesting right atrial pressure of 3 mmHg. IAS/Shunts: No atrial level shunt detected by color flow Doppler.  LEFT VENTRICLE PLAX 2D LVIDd:         3.60 cm LVIDs:         1.80 cm LV PW:         1.40 cm LV IVS:        1.10 cm LVOT diam:     2.30 cm LV SV:         64 LV SV Index:   37 LVOT Area:     4.15 cm  LV Volumes (MOD) LV vol d, MOD A2C: 38.7 ml LV vol d, MOD A4C: 42.3 ml LV vol s, MOD A2C: 13.2 ml LV vol s, MOD A4C: 14.4 ml LV SV MOD A2C:     25.5 ml LV SV MOD A4C:     42.3 ml LV SV MOD BP:      26.8 ml RIGHT VENTRICLE RV Basal diam:  4.30 cm RV Mid diam:    2.40 cm RV S prime:     10.60 cm/s TAPSE (M-mode): 1.1 cm LEFT ATRIUM             Index       RIGHT ATRIUM  Index LA Vol (A2C):   37.3 ml 21.34 ml/m RA Area:     17.70 cm LA Vol (A4C):   62.3 ml 35.64 ml/m RA Volume:   45.70 ml  26.15 ml/m LA Biplane Vol: 51.2 ml 29.29 ml/m  AORTIC VALVE                    PULMONIC VALVE AV Area (Vmax):    2.18 cm     PV Vmax:       0.86 m/s AV  Area (Vmean):   2.25 cm     PV Vmean:      57.300 cm/s AV Area (VTI):     1.99 cm     PV VTI:        0.155 m AV Vmax:           198.25 cm/s  PV Peak grad:  3.0 mmHg AV Vmean:          127.375 cm/s PV Mean grad:  2.0 mmHg AV VTI:            0.324 m AV Peak Grad:      15.7 mmHg AV Mean Grad:      8.2 mmHg LVOT Vmax:         104.00 cm/s LVOT Vmean:        69.000 cm/s LVOT VTI:          0.155 m LVOT/AV VTI ratio: 0.48 AI PHT:            339 msec  AORTA Ao Root diam: 3.30 cm Ao Asc diam:  3.87 cm MR Peak grad:    62.1 mmHg   TRICUSPID VALVE MR Vmax:         394.00 cm/s TR Peak grad:   36.7 mmHg MR PISA:         1.01 cm    TR Vmax:        303.00 cm/s MR PISA Eff ROA: 6 mm MR PISA Radius:  0.40 cm     SHUNTS                              Systemic VTI:  0.16 m                              Systemic Diam: 2.30 cm Sanda Klein MD Electronically signed by Sanda Klein MD Signature Date/Time: 08/08/2020/3:15:56 PM    Final     Cardiac Studies   Echocardiogram 08/07/2020: Impressions: 1. Left ventricular ejection fraction, by estimation, is 70 to 75%. The  left ventricle has hyperdynamic function. The left ventricle has no  regional wall motion abnormalities. Left ventricular diastolic function  could not be evaluated.   2. Right ventricular systolic function is normal. The right ventricular  size is normal. There is mildly elevated pulmonary artery systolic  pressure.   3. Left atrial size was mildly dilated.   4. The mitral valve is normal in structure. Mild mitral valve  regurgitation. No evidence of mitral stenosis.   5. The aortic valve is tricuspid. There is mild calcification of the  aortic valve. There is mild thickening of the aortic valve. Aortic valve  regurgitation is mild to moderate. Mild to moderate aortic valve  sclerosis/calcification is present, without  any evidence of aortic stenosis.   6. There is mild dilatation of the ascending aorta, measuring 39 mm.  7. The inferior vena cava is  normal in size with greater than 50%  respiratory variability, suggesting right atrial pressure of 3 mmHg.   Comparison(s): Prior images reviewed side by side. Degenerative aortic valve changes and the degree of aortic insufficiency is slightly worse. The ascending aorta is not seen as clearly on the current study and was previously measured as larger.   Patient Profile     87 y.o. male with a history of permanent atrial fibrillation on Eliquis, ascending thoracic aortic aneurysm followed by Dr. [name], hypertension, pancreatic cancer with obstructive juandice s/p biliary stent. Admitted on 08/04/2020 with sepsis secondary to intrabdominal infection. Cardiology consulted on 08/08/2020 for evaluation of atrial fibrillation with RVR at the request of Dr. [name].  Assessment & Plan    Permanent Atrial Fibrillation  - Rates in the 80s to 90s overnight and in the 100s to 110s this morning. - Potassium 3.0 yesterday. Supplemented and 3.6 today. - Magnesium 1.9. - Will check TSH. - Echo showed LVEF of 70-75% with normal wall motion. - Currently on IV Diltiazem drip and PO Cardizem '120mg'$  daily as well as Lopressor '50mg'$  twice daily. Think we can discontinue IV Diltiazem and just increase oral medications. However, suspect rates to improve as underlying infection improves as well. Will discuss with MD. - Continue chronic anticoagulation with Eliquis '5mg'$  twice daily. If decision made to proceed with ERCP, will need to transition to IV Heparin.  Acute Diastolic CHF - Patient developed acute shortness of breath on 08/07/2020. Occurred after receiving IV fluids in setting of sepsis. - BNP 371.  - Chest x-ray today showed cardiomegaly with bilateral interstitial prominence and small bilateral pleural effusions slightly progressed from prior exam. - Has received 3 doses of IV Lasix but this was discontinued yesterday. Documented urinary output of 3.1 L in the last 24 hours but only net negative 110 mL this  admission. No updated weight since admission. Renal function stable.  - Decreased breath sounds in bilateral bases but otherwise does not appear volume overloaded on exam. - Consider another dose of IV Lasix today. - Continue to monitor volume status closely.  Ascending Thoracic Aorta Aneurysm - Stable at 4.2cm on last chest CTA in 07/2019. - Not clearly seen on Echo this admission but noted to be 3.9 cm. - Follow-up with Dr. [name] as outpatient.  Hypertension - BP elevated. Systolic BP mostly in the 140s to 170s. - Currently on IV Diltiazem as well as PO Diltiazem '120mg'$  daily and Lopressor '50mg'$  daily which are his home medications.  - Benazepril-HCTZ 20-12.'5mg'$  daily also listed under home medications. Consider restarting home ACE.   Otherwise, per primary team: - Sepsis secondary to intrabdominal infection and Klebsiella bacteremia - Metastatic pancreatic cancer with mets to the liver s/p biliary stent in 06/2020 - Elevated LFTs - Metabolic acidosis - Normocytic anemia - Hypophosphatemia    For questions or updates, please contact [facility] Please consult [email or web address] for contact info under        Signed, Darreld Mclean, PA-C  08/09/2020, 7:51 AM    ADDENDUM 08/09/2020 10:31AM: Spoke with Dr. Margaretann Loveless about above plan. Will continue IV Diltiazem for another 24 hours and increase PO Diltiazem to '240mg'$  daily. Will also give another dose of IV Lasix '40mg'$  today and then reassess tomorrow.  Darreld Mclean, PA-C 08/09/2020 10:31 AM  Patient seen and examined with Sande Rives, PA-C.  Agree as above, with the following exceptions and changes as noted below.  Patient is  feeling well today and overall has minimal pain in the right side abdomen.  Remains in atrial fibrillation with controlled ventricular response on IV diltiazem.  Patient's son and grandson are in the room today and ask pertinent questions.. Gen: NAD, CV: iRRR, no murmurs, Lungs: clear, Abd: soft, Extrem:  Warm, well perfused, no edema, Neuro/Psych: alert and oriented x 3, normal mood and affect. All available labs, radiology testing, previous records reviewed.  He is mildly hypertensive and in an effort to liberate him from the diltiazem infusion, we will uptitrate his home Cardizem to 240 mg daily.  Continue metoprolol tartrate 50 mg twice daily.  He had acute shortness of breath 2 days ago in the setting of IV fluids administered for sepsis.  1 dose of Lasix was given with good response, since rate control has improved and he appears stable from a respiratory standpoint, will give 1 additional dose of Lasix and monitor.  Unlikely to need more going forward.  Elouise Munroe, MD 08/09/20 4:52 PM

## 2020-08-09 NOTE — Assessment & Plan Note (Signed)
-   continue O2, wean as able

## 2020-08-09 NOTE — Assessment & Plan Note (Signed)
-  Patient met sepsis criteria on admission; febrile, tachycardic and had a leukocytosis; source considered bacteremia likely translocation from GI source - see bacteremia

## 2020-08-09 NOTE — Assessment & Plan Note (Signed)
-  Replete and recheck as needed 

## 2020-08-09 NOTE — Assessment & Plan Note (Addendum)
-   LFT pattern consistent with obstructive pattern as seen on ERCP 8/5 with sludge partially occluding biliary stent and tumor ingrowth - TB still uptrending s/p 1st ERCP; up to 12.7 on 8/8 - per report, sludge partially occluding stent which was removed; tumor ingrowth noted as well and may still require stent within stent - s/p repeat ERCP on 08/14/20 and now LFTs starting to downtrend - d/c abx as noted above

## 2020-08-09 NOTE — TOC Progression Note (Signed)
Transition of Care (TOC) - Progression Note    Patient Details  Name: Barry Contreras. MRN: [id number] Date of Birth: 09/11/1931  Transition of Care [facility]) CM/SW Contact  Leeroy Cha, RN Phone Number: 08/09/2020, 9:25 AM  Clinical Narrative:    an 87 year old Caucasian male with a past medical history significant for but not limited to pancreatic cancer with recent systemic chemotherapy, history of obstructive jaundice status post biliary stent placement, proximal atrial fibrillation on anticoagulant with Eliquis, hypertension, prediabetes as well as other comorbidities who presented with a chief complaint of fever, nausea, vomiting.  Patient daughter was at bedside when he came in and he was having chills and rigors.  Daughter just come into town to visit and states that yesterday began to develop fevers or chills.  The patient had 1 episode of nausea vomiting but no diarrhea.  No chest pain.  He has a history of obstructive jaundice and is status post biliary stent placement with [facility] in 07/04/2020.  In the ED is found to be tachycardic and tachypneic with a lactic acid of 3.4 and abnormal LFTs.  He underwent a CT of the abdomen and pelvis which showed pneumobilia with slight gallbladder wall enhancement and wall thickening.  A dedicated right upper quadrant ultrasound showed gallbladder wall thickening with no visible stone.  The ED discussed with Dr. Michail Sermon who recommended the patient to be kept n.p.o. and continue antibiotics.  General surgery was also consulted and recommended the GI evaluation and he started on IV cefepime and Flagyl and this has now been de-escalated to IV ceftriaxone given that further work-up is revealed that he got a Klebsiella bacteremia.   He was placed on a clear liquid diet by GI and was advanced to full liquid diet yesterday.  GI recommending continue IV antibiotics for now and deferring to oncology whether port should be placed with his bacteremia and Dr.  Michail Sermon is to discuss with Dr. Watt Climes on the patency of the biliary stent.  Diet was further advanced to a soft diet yesterday.  This morning he was in A. fib with RVR with uncontrolled heart rates he was given a dose of 5 metoprolol which improved his heart rates.  We will change to oral regimen based on the sensitivities of [name].  Oncology recommending delaying Port-A-Cath placement until he has completed at least a week of antibiotics with repeat negative blood cultures.  Klebsiella pneumoniae is pansensitive now and only resistant to ampicillin so will discuss with ID about transitioning to p.o and they are recommending treating with Augmenting 875 mg po BID for total Antibiotic course being a total of 10 days.   Patient decompensated overnight and went into A. fib with RVR had flash pulmonary edema.  Heart rates were in the 170s to 160s and he was given metoprolol with minimal improvement so he transferred to the stepdown unit and placed on a Cardizem drip.  Because of his shortness of breath a stat x-ray was done and showed consistency with CHF so he was stopped with his fluid hydration and given IV Lasix.  Cardiology was consulted overnight.  GI recommending resuming broad-spectrum antibiotics will use change back to IV ceftriaxone and Flagyl.  Echocardiogram is done and showed an EF of 70 to 75% but left ventricular diastolic parameters could not be assessed given his A. fib.  TOC PLAN OF CARE:  following for progression of care art this time.   Expected Discharge Plan: Home/Self Care Barriers to Discharge:  Continued Medical Work up  Expected Discharge Plan and Services Expected Discharge Plan: Home/Self Care   Discharge Planning Services: CM Consult   Living arrangements for the past 2 months: Single Family Home                                       Social Determinants of Health (SDOH) Interventions    Readmission Risk Interventions No flowsheet data found.

## 2020-08-09 NOTE — Assessment & Plan Note (Addendum)
-   cardiology following as well - has been on cardizem drip and PO - remaining on drip until procedures are concluded (still possibility of repeat ERCP) - continue holding eliquis; continue heparin drip until LFTs consistently downtrending x 48 hours and until port is placed, then okay to resume Eliquis

## 2020-08-09 NOTE — Assessment & Plan Note (Addendum)
-   4/4 bottles with klebsiella noted on 7/29 cultures - transitioned to Rocephin; goal is 10 days treatment (day 10 was 8/7, course completed on 8/7 for bacteremia - repeat blood cultures on 8/8 (1/4 [facility], this is a probable contaminate and will not cover; repeat blood culture again on 8/10) - if blood culture negative at 48 hours likely can d/c abx if okay with GI: will have abx discontinued on 8/9 and monitor off

## 2020-08-09 NOTE — Assessment & Plan Note (Signed)
-   elevated BNP; CXR shows vascular congestion - etiology possibly from afib with RVR - cardiology following and patient on lasix per cardiology - continue I&O - continue cardizem  - s/p echo: EF 70-75%, unable to grade diastology 2/2 afib

## 2020-08-09 NOTE — Assessment & Plan Note (Addendum)
-   stage IV; mets to liver; diagnosed on EUS 07/04/20 s/p biopsy with adenocarcinoma - now s/p CBD stent on 6/28 - tentative plan is patient wishes for chemo for prolonging life some but also wishes to have a quality of life - currently prognosis seems guarded/poor - s/p ERCP x 2 inpatient - oncology pursuing inpatient port placement (continuing heparin gtt until placed)

## 2020-08-09 NOTE — Progress Notes (Signed)
[facility] Progress Note  Manson Luckadoo. 87 y.o. June 07, 1931   Subjective: Sitting up eating breakfast. Had a formed stool today. Nurse in room.  Objective: Vital signs: Vitals:   08/09/20 0800 08/09/20 1035  BP: (!) 157/87 (!) 156/90  Pulse: (!) 106 (!) 109  Resp: 18   Temp:    SpO2: 93%   T 98.5  Physical Exam: Gen: lethargic, elderly, no acute distress  HEENT: +icteric sclera CV: RRR Chest: CTA B Abd: soft, nontender, nondistended, +BS Ext: no edema  Lab Results: Recent Labs    08/08/20 0257 08/09/20 0247  NA 135 137  K 3.0* 3.6  CL 102 100  CO2 22 24  GLUCOSE 142* 130*  BUN 9 15  CREATININE 0.57* 0.57*  CALCIUM 8.5* 8.6*  MG 2.0 1.9  PHOS 2.0* 3.2   Recent Labs    08/08/20 0257 08/09/20 0247  AST 134* 127*  ALT 117* 124*  ALKPHOS 559* 549*  BILITOT 3.9* 5.7*  PROT 6.6 6.2*  ALBUMIN 3.3* 3.1*   Recent Labs    08/08/20 0257 08/09/20 0247  WBC 13.9* 10.8*  NEUTROABS 11.0* 8.2*  HGB 11.8* 11.6*  HCT 35.7* 35.6*  MCV 90.6 91.5  PLT 332 318      Assessment/Plan: Met pancreatic cancer with obstructive jaundice - s/p metal biliary stent in June. Increase in bilirubin and transaminases likely from passive congestion from Afib and RVR. I think the biliary stent is patent but if LFTs remain elevated then will need to repeat ERCP and will need to hold Eliquis and start IV heparin per cardiology recs. For now continue IV Abx, supportive care, and follow closely.   [name] 08/09/2020, 10:59 AM  Questions please call [phone] Patient ID: Jhonny Senn., male   DOB: [birth year], 87 y.o.   MRN: [id number]

## 2020-08-09 NOTE — Progress Notes (Signed)
Progress Note    [name].   LNL:[id number]  DOB: 07-Jul-1931  DOA: 08/04/2020     4  PCP: Lajean Manes, MD  CC: N/V, fever, chills  Hospital Course: Mr. Hovis is an 87 year old Caucasian male with PMH pancreatic cancer, history of obstructive jaundice status post biliary stent placement 07/04/20, permanent atrial fibrillation on Eliquis, hypertension, prediabetes as well as other comorbidities who presented with a chief complaint of fever, nausea, vomiting, chills, and rigors.   In the ED is found to be tachycardic and tachypneic with a lactic acid of 3.4 and abnormal LFTs.  He underwent a CT of the abdomen and pelvis which showed pneumobilia with slight gallbladder wall enhancement and wall thickening.  A dedicated right upper quadrant ultrasound showed gallbladder wall thickening with no visible stone.   Case was discussed with GI.  Patient was started on antibiotics.  Blood cultures ultimately speciated out Klebsiella. Patient also developed A. fib with RVR during hospitalization.  Cardiology was consulted and he ultimately required initiation of a Cardizem drip.  Interval History:  No events overnight.  Still complaining of generalized abdominal pain but worse on the right side.  No nausea or vomiting.  Briefly discussed some goals of care.  Ultimately, he understands prognosis is overall poor but he also does not wish to die in pain he says.  He is still hoping for outpatient chemo if able to be accomplished and as long as his quality of life does not suffer.  ROS: Constitutional: negative for chills and fevers, Respiratory: negative for cough and sputum, Cardiovascular: negative for chest pain, and Gastrointestinal: positive for abdominal pain  Assessment & Plan: * Sepsis (HCC)-resolved as of 08/09/2020 - Patient met sepsis criteria on admission; febrile, tachycardic and had a leukocytosis; source considered bacteremia likely translocation from GI source - see bacteremia    Bacteremia due to Gram-negative bacteria - 4/4 bottles with klebsiella noted on 7/29 cultures - transitioned to Rocephin; goal is 7-10 days treatment; patient will need negative repeat culture after abx prior to chemo port placement   Elevated LFTs - LFT pattern consistent with either obstructive pattern vs hepatic congestion from dCHF/afib - TB still uptrending on 8/3 - tentative plan is likely 24 hours more observation, if still uptrending, might need ERCP still with GI but plan ultimately deferred to GI   Cancer of head of pancreas ([place]) - stage IV; mets to liver; diagnosed on EUS 07/04/20 s/p biopsy with adenocarcinoma - now s/p CBD stent on 6/28 - tentative plan is patient wishes for chemo for prolonging life some but also wishes to have a quality of life - currently prognosis seems guarded/poor given worsening LFTs but still being worked up  Acute diastolic CHF (congestive heart failure) ([place]) - elevated BNP; CXR shows vascular congestion - etiology possibly from afib with RVR - cardiology following and patient on lasix per cardiology - continue I&O - continue cardizem  - s/p echo: EF 70-75%, unable to grade diastology 2/2 afib  Paroxysmal atrial fibrillation ([place]) - cardiology following as well - has been on cardizem drip; tentative plan is to convert to PO and monitor response   Acute respiratory failure with hypoxia ([place]) - continue O2, wean as able   Normocytic anemia -Likely dilutional drop -Check anemia panel and showed an iron level of 17, U IBC of 224, TIBC of 241, saturation ratios of 7%, ferritin level 333, folate level 14.1, and vitamin B12 level 1477 -continue monitor for signs or symptoms of  bleeding; currently no overt bleeding noted -Repeat CBC in a.m.  Hypophosphatemia - Replete and recheck as needed  Hypokalemia - Replete and recheck as needed    Old records reviewed in assessment of this patient  Antimicrobials: Cefepime 08/04/2020 >>  08/05/2020 Augmentin 08/07/2020 >> 08/08/2020 Rocephin 08/05/2020 >> current  DVT prophylaxis:  apixaban (ELIQUIS) tablet 5 mg   Code Status:   Code Status: Full Code Family Communication:   Disposition Plan: Status is: Inpatient  Remains inpatient appropriate because:IV treatments appropriate due to intensity of illness or inability to take PO and Inpatient level of care appropriate due to severity of illness  Dispo: The patient is from: Home              Anticipated d/c is to: Home              Patient currently is not medically stable to d/c.   Difficult to place patient No  Risk of unplanned readmission score: Unplanned Admission- Pilot do not use: 20.37   Objective: Blood pressure (!) 145/88, pulse (!) 109, temperature 99.1 F (37.3 C), temperature source Axillary, resp. rate 15, height 5' 2" (1.575 m), weight 73.5 kg, SpO2 94 %.  Examination: General appearance: alert, cooperative, and no distress Head: Normocephalic, without obvious abnormality, atraumatic Eyes:  EOMI Lungs: clear to auscultation bilaterally Heart: regular rate and rhythm and S1, S2 normal Abdomen:  worst TTP in right quadrants; BS present  Extremities:  trace LE edema Skin: mobility and turgor normal Neurologic: Grossly normal  Consultants:  Oncology Cardiology GI  Procedures:    Data Reviewed: I have personally reviewed following labs and imaging studies Results for orders placed or performed during the hospital encounter of 08/04/20 (from the past 24 hour(s))  Comprehensive metabolic panel     Status: Abnormal   Collection Time: 08/09/20  2:47 AM  Result Value Ref Range   Sodium 137 135 - 145 mmol/L   Potassium 3.6 3.5 - 5.1 mmol/L   Chloride 100 98 - 111 mmol/L   CO2 24 22 - 32 mmol/L   Glucose, Bld 130 (H) 70 - 99 mg/dL   BUN 15 8 - 23 mg/dL   Creatinine, Ser 0.57 (L) 0.61 - 1.24 mg/dL   Calcium 8.6 (L) 8.9 - 10.3 mg/dL   Total Protein 6.2 (L) 6.5 - 8.1 g/dL   Albumin 3.1 (L) 3.5 - 5.0  g/dL   AST 127 (H) 15 - 41 U/L   ALT 124 (H) 0 - 44 U/L   Alkaline Phosphatase 549 (H) 38 - 126 U/L   Total Bilirubin 5.7 (H) 0.3 - 1.2 mg/dL   GFR, Estimated >60 >60 mL/min   Anion gap 13 5 - 15  CBC with Differential/Platelet     Status: Abnormal   Collection Time: 08/09/20  2:47 AM  Result Value Ref Range   WBC 10.8 (H) 4.0 - 10.5 K/uL   RBC 3.89 (L) 4.22 - 5.81 MIL/uL   Hemoglobin 11.6 (L) 13.0 - 17.0 g/dL   HCT 35.6 (L) 39.0 - 52.0 %   MCV 91.5 80.0 - 100.0 fL   MCH 29.8 26.0 - 34.0 pg   MCHC 32.6 30.0 - 36.0 g/dL   RDW 14.6 11.5 - 15.5 %   Platelets 318 150 - 400 K/uL   nRBC 0.0 0.0 - 0.2 %   Neutrophils Relative % 75 %   Neutro Abs 8.2 (H) 1.7 - 7.7 K/uL   Lymphocytes Relative 13 %   Lymphs Abs 1.4  0.7 - 4.0 K/uL   Monocytes Relative 10 %   Monocytes Absolute 1.1 (H) 0.1 - 1.0 K/uL   Eosinophils Relative 1 %   Eosinophils Absolute 0.1 0.0 - 0.5 K/uL   Basophils Relative 0 %   Basophils Absolute 0.0 0.0 - 0.1 K/uL   Immature Granulocytes 1 %   Abs Immature Granulocytes 0.08 (H) 0.00 - 0.07 K/uL  Magnesium     Status: None   Collection Time: 08/09/20  2:47 AM  Result Value Ref Range   Magnesium 1.9 1.7 - 2.4 mg/dL  Phosphorus     Status: None   Collection Time: 08/09/20  2:47 AM  Result Value Ref Range   Phosphorus 3.2 2.5 - 4.6 mg/dL  TSH     Status: None   Collection Time: 08/09/20  8:20 AM  Result Value Ref Range   TSH 1.765 0.350 - 4.500 uIU/mL    Recent Results (from the past 240 hour(s))  Blood Culture (routine x 2)     Status: Abnormal   Collection Time: 08/04/20  7:18 PM   Specimen: BLOOD  Result Value Ref Range Status   Specimen Description   Final    BLOOD RIGHT ANTECUBITAL Performed at [facility], [place] [address] [place], [place] [address]    Special Requests   Final    BOTTLES DRAWN AEROBIC AND ANAEROBIC Blood Culture adequate volume Performed at [facility] [address], [place], [place] [address]     Culture  Setup Time   Final    GRAM NEGATIVE RODS IN BOTH AEROBIC AND ANAEROBIC BOTTLES CRITICAL VALUE NOTED.  VALUE IS CONSISTENT WITH PREVIOUSLY REPORTED AND CALLED VALUE.    Culture (A)  Final    KLEBSIELLA PNEUMONIAE SUSCEPTIBILITIES PERFORMED ON PREVIOUS CULTURE WITHIN THE LAST 5 DAYS. Performed at [facility], [place] [address], [place], [place] [address]    Report Status 08/07/2020 FINAL  Final  Blood Culture (routine x 2)     Status: Abnormal   Collection Time: 08/04/20  7:25 PM   Specimen: BLOOD  Result Value Ref Range Status   Specimen Description   Final    BLOOD WRIST LEFT Performed at [facility] [address] [place], [place] [address]    Special Requests   Final    BOTTLES DRAWN AEROBIC AND ANAEROBIC Blood Culture adequate volume Performed at [facility] [address] [place], [place] [address]    Culture  Setup Time   Final    GRAM NEGATIVE RODS IN BOTH AEROBIC AND ANAEROBIC BOTTLES CRITICAL RESULT CALLED TO, READ BACK BY AND VERIFIED WITH: CHRISTINE SHADE PHARMD _0  08/05/20 EB Performed at [facility], [place] [address], [place], [place] [address]    Culture KLEBSIELLA PNEUMONIAE (A)  Final   Report Status 08/07/2020 FINAL  Final   Organism ID, Bacteria KLEBSIELLA PNEUMONIAE  Final      Susceptibility   Klebsiella pneumoniae - MIC*    AMPICILLIN RESISTANT Resistant     CEFAZOLIN <=4 SENSITIVE Sensitive     CEFEPIME <=0.12 SENSITIVE Sensitive     CEFTAZIDIME <=1 SENSITIVE Sensitive     CEFTRIAXONE <=0.25 SENSITIVE Sensitive     CIPROFLOXACIN <=0.25 SENSITIVE Sensitive     GENTAMICIN <=1 SENSITIVE Sensitive     IMIPENEM <=0.25 SENSITIVE Sensitive     TRIMETH/SULFA <=20 SENSITIVE Sensitive     AMPICILLIN/SULBACTAM 4 SENSITIVE Sensitive     PIP/TAZO <=4 SENSITIVE Sensitive     *  KLEBSIELLA PNEUMONIAE  Blood Culture ID Panel (Reflexed)     Status: Abnormal   Collection Time: 08/04/20  7:25 PM   Result Value Ref Range Status   Enterococcus faecalis NOT DETECTED NOT DETECTED Final   Enterococcus Faecium NOT DETECTED NOT DETECTED Final   Listeria monocytogenes NOT DETECTED NOT DETECTED Final   Staphylococcus species NOT DETECTED NOT DETECTED Final   Staphylococcus aureus (BCID) NOT DETECTED NOT DETECTED Final   Staphylococcus epidermidis NOT DETECTED NOT DETECTED Final   Staphylococcus lugdunensis NOT DETECTED NOT DETECTED Final   Streptococcus species NOT DETECTED NOT DETECTED Final   Streptococcus agalactiae NOT DETECTED NOT DETECTED Final   Streptococcus pneumoniae NOT DETECTED NOT DETECTED Final   Streptococcus pyogenes NOT DETECTED NOT DETECTED Final   A.calcoaceticus-baumannii NOT DETECTED NOT DETECTED Final   Bacteroides fragilis NOT DETECTED NOT DETECTED Final   Enterobacterales DETECTED (A) NOT DETECTED Final    Comment: Enterobacterales represent a large order of gram negative bacteria, not a single organism. CRITICAL RESULT CALLED TO, READ BACK BY AND VERIFIED WITH: [place] _0  08/05/20 EB    Enterobacter cloacae complex NOT DETECTED NOT DETECTED Final   Escherichia coli NOT DETECTED NOT DETECTED Final   Klebsiella aerogenes NOT DETECTED NOT DETECTED Final   Klebsiella oxytoca NOT DETECTED NOT DETECTED Final   Klebsiella pneumoniae DETECTED (A) NOT DETECTED Final    Comment: CRITICAL RESULT CALLED TO, READ BACK BY AND VERIFIED WITH: CHRISTINE SHADE PHARMD _1  08/05/20 EB    Proteus species NOT DETECTED NOT DETECTED Final   Salmonella species NOT DETECTED NOT DETECTED Final   Serratia marcescens NOT DETECTED NOT DETECTED Final   Haemophilus influenzae NOT DETECTED NOT DETECTED Final   Neisseria meningitidis NOT DETECTED NOT DETECTED Final   Pseudomonas aeruginosa NOT DETECTED NOT DETECTED Final   Stenotrophomonas maltophilia NOT DETECTED NOT DETECTED Final   Candida albicans NOT DETECTED NOT DETECTED Final   Candida auris NOT DETECTED NOT  DETECTED Final   Candida glabrata NOT DETECTED NOT DETECTED Final   Candida krusei NOT DETECTED NOT DETECTED Final   Candida parapsilosis NOT DETECTED NOT DETECTED Final   Candida tropicalis NOT DETECTED NOT DETECTED Final   Cryptococcus neoformans/gattii NOT DETECTED NOT DETECTED Final   CTX-M ESBL NOT DETECTED NOT DETECTED Final   Carbapenem resistance IMP NOT DETECTED NOT DETECTED Final   Carbapenem resistance KPC NOT DETECTED NOT DETECTED Final   Carbapenem resistance NDM NOT DETECTED NOT DETECTED Final   Carbapenem resist OXA 48 LIKE NOT DETECTED NOT DETECTED Final   Carbapenem resistance VIM NOT DETECTED NOT DETECTED Final    Comment: Performed at [facility], [address] [place], [place] [address]  Resp Panel by RT-PCR (Flu A&B, Covid) Nasopharyngeal Swab     Status: None   Collection Time: 08/04/20  7:45 PM   Specimen: Nasopharyngeal Swab; Nasopharyngeal(NP) swabs in vial transport medium  Result Value Ref Range Status   SARS Coronavirus 2 by RT PCR NEGATIVE NEGATIVE Final    Comment: (NOTE) SARS-CoV-2 target nucleic acids are NOT DETECTED.  The SARS-CoV-2 RNA is generally detectable in upper respiratory specimens during the acute phase of infection. The lowest concentration of SARS-CoV-2 viral copies this assay can detect is 138 copies/mL. A negative result does not preclude SARS-Cov-2 infection and should not be used as the sole basis for treatment or other patient management decisions. A negative result may occur with  improper specimen collection/handling, submission of specimen other than nasopharyngeal swab, presence of viral mutation(s)  within the areas targeted by this assay, and inadequate number of viral copies(<138 copies/mL). A negative result must be combined with clinical observations, patient history, and epidemiological information. The expected result is Negative.  Fact Sheet for Patients:  EntrepreneurPulse.com.au  Fact Sheet  for Healthcare Providers:  IncredibleEmployment.be  This test is no t yet approved or cleared by the [place] FDA and  has been authorized for detection and/or diagnosis of SARS-CoV-2 by FDA under an Emergency Use Authorization (EUA). This EUA will remain  in effect (meaning this test can be used) for the duration of the COVID-19 declaration under Section 564(b)(1) of the Act, 21 U.S.C.section 360bbb-3(b)(1), unless the authorization is terminated  or revoked sooner.       Influenza A by PCR NEGATIVE NEGATIVE Final   Influenza B by PCR NEGATIVE NEGATIVE Final    Comment: (NOTE) The Xpert Xpress SARS-CoV-2/FLU/RSV plus assay is intended as an aid in the diagnosis of influenza from Nasopharyngeal swab specimens and should not be used as a sole basis for treatment. Nasal washings and aspirates are unacceptable for Xpert Xpress SARS-CoV-2/FLU/RSV testing.  Fact Sheet for Patients: EntrepreneurPulse.com.au  Fact Sheet for Healthcare Providers: IncredibleEmployment.be  This test is not yet approved or cleared by the [place] FDA and has been authorized for detection and/or diagnosis of SARS-CoV-2 by FDA under an Emergency Use Authorization (EUA). This EUA will remain in effect (meaning this test can be used) for the duration of the COVID-19 declaration under Section 564(b)(1) of the Act, 21 U.S.C. section 360bbb-3(b)(1), unless the authorization is terminated or revoked.  Performed at [facility], [place] [address] [place], [place] [address]   C Difficile Quick Screen w PCR reflex     Status: None   Collection Time: 08/06/20  1:41 PM   Specimen: STOOL  Result Value Ref Range Status   C Diff antigen NEGATIVE NEGATIVE Final   C Diff toxin NEGATIVE NEGATIVE Final   C Diff interpretation NEGATIVE  Final    Comment: Performed at [facility], [place] [address] [place], [place]  [address]  MRSA Next Gen by PCR, Nasal     Status: None   Collection Time: 08/07/20  9:00 PM   Specimen: Nasal Mucosa; Nasal Swab  Result Value Ref Range Status   MRSA by PCR Next Gen NOT DETECTED NOT DETECTED Final    Comment: (NOTE) The GeneXpert MRSA Assay (FDA approved for NASAL specimens only), is one component of a comprehensive MRSA colonization surveillance program. It is not intended to diagnose MRSA infection nor to guide or monitor treatment for MRSA infections. Test performance is not FDA approved in patients less than 35 years old. Performed at [facility], [place] [address], [place], [place] [address]      Radiology Studies: DG CHEST PORT 1 VIEW  Result Date: 08/09/2020 CLINICAL DATA:  Shortness of breath. EXAM: PORTABLE CHEST 1 VIEW COMPARISON:  08/08/2020. FINDINGS: Cardiomegaly with bilateral interstitial prominence slightly progressed from prior exam. Small bilateral pleural effusions slightly progressed from prior exam. Findings consistent with CHF. Pneumonitis cannot be excluded. No pneumothorax. IMPRESSION: Cardiomegaly with bilateral interstitial prominence slightly progressed from prior exam. Small bilateral pleural effusions slightly progressed from prior exam. Findings most consistent with CHF. Electronically Signed   By: Marcello Moores  Register   On: 08/09/2020 07:51   DG CHEST PORT 1 VIEW  Result Date: 08/08/2020 CLINICAL DATA:  Recent diagnosis of pancreatic cancer. Fever malaise. Nausea and vomiting. EXAM: PORTABLE CHEST 1  VIEW COMPARISON:  Chest x-ray 08/07/2020. FINDINGS: Cardiomegaly with bilateral interstitial prominence in small bilateral pleural effusions again noted. Findings again consistent with CHF. Pneumonitis cannot be excluded. No pneumothorax. IMPRESSION: Cardiomegaly with bilateral interstitial prominence and small bilateral pleural effusions again noted. Findings again consistent with CHF. Pneumonitis cannot be excluded. Similar findings noted  on prior study of 08/07/2020. Electronically Signed   By: Marcello Moores  Register   On: 08/08/2020 08:53   DG Chest Port 1 View  Result Date: 08/07/2020 CLINICAL DATA:  Acute respiratory distress. EXAM: PORTABLE CHEST 1 VIEW COMPARISON:  Radiograph 08/04/2020. Lung bases from abdominal CT 08/04/2020. FINDINGS: Increasing hazy opacity at the lung bases consistent with increasing pleural effusions, right greater than left. There is septal thickening and alveolar opacities consistent with pulmonary edema. Mild cardiomegaly with equivocal increase. No pneumothorax or confluent airspace disease. Bilateral glenohumeral arthropathy degenerative change of the shoulders. IMPRESSION: Findings consistent with CHF, new from prior exam. Electronically Signed   By: Keith Rake M.D.   On: 08/07/2020 20:03   ECHOCARDIOGRAM COMPLETE  Result Date: 08/08/2020    ECHOCARDIOGRAM REPORT   Patient Name:   MONTAGUE CORELLA Date of Exam: 08/08/2020 Medical Rec #:  [id number]      Height:       62.0 in Accession #:    [id number]     Weight:       162.0 lb Date of Birth:  1931/09/30      BSA:          1.748 m Patient Age:    66 years       BP:           128/92 mmHg Patient Gender: M              HR:           76 bpm. Exam Location:  Inpatient Procedure: 2D Echo, Cardiac Doppler and Color Doppler Indications:    Dyspnea  History:        Patient has prior history of Echocardiogram examinations, most                 recent 12/12/2016. Arrythmias:Atrial Fibrillation,                 Signs/Symptoms:Murmur; Risk Factors:Hypertension.  Sonographer:    Luisa Hart RDCS Referring Phys: [id number] Georgina Quint LATIF [place]  1. Left ventricular ejection fraction, by estimation, is 70 to 75%. The left ventricle has hyperdynamic function. The left ventricle has no regional wall motion abnormalities. Left ventricular diastolic function could not be evaluated.  2. Right ventricular systolic function is normal. The right ventricular size is normal. There is  mildly elevated pulmonary artery systolic pressure.  3. Left atrial size was mildly dilated.  4. The mitral valve is normal in structure. Mild mitral valve regurgitation. No evidence of mitral stenosis.  5. The aortic valve is tricuspid. There is mild calcification of the aortic valve. There is mild thickening of the aortic valve. Aortic valve regurgitation is mild to moderate. Mild to moderate aortic valve sclerosis/calcification is present, without any evidence of aortic stenosis.  6. There is mild dilatation of the ascending aorta, measuring 39 mm.  7. The inferior vena cava is normal in size with greater than 50% respiratory variability, suggesting right atrial pressure of 3 mmHg. Comparison(s): Prior images reviewed side by side. Degenerative aortic valve changes and the degree of aortic insufficiency is slightly worse. The ascending aorta is not seen as clearly on the current  study and was previously measured as larger. FINDINGS  Left Ventricle: Left ventricular ejection fraction, by estimation, is 70 to 75%. The left ventricle has hyperdynamic function. The left ventricle has no regional wall motion abnormalities. The left ventricular internal cavity size was normal in size. There is no left ventricular hypertrophy. Left ventricular diastolic function could not be evaluated due to atrial fibrillation. Left ventricular diastolic function could not be evaluated. Right Ventricle: The right ventricular size is normal. No increase in right ventricular wall thickness. Right ventricular systolic function is normal. There is mildly elevated pulmonary artery systolic pressure. The tricuspid regurgitant velocity is 3.03  m/s, and with an assumed right atrial pressure of 3 mmHg, the estimated right ventricular systolic pressure is 82.9 mmHg. Left Atrium: Left atrial size was mildly dilated. Right Atrium: Right atrial size was normal in size. Pericardium: Trivial pericardial effusion is present. Mitral Valve: The mitral  valve is normal in structure. Mild mitral valve regurgitation. No evidence of mitral valve stenosis. Tricuspid Valve: The tricuspid valve is normal in structure. Tricuspid valve regurgitation is mild . No evidence of tricuspid stenosis. Aortic Valve: The aortic valve is tricuspid. There is mild calcification of the aortic valve. There is mild thickening of the aortic valve. Aortic valve regurgitation is mild to moderate. Aortic regurgitation PHT measures 339 msec. Mild to moderate aortic valve sclerosis/calcification is present, without any evidence of aortic stenosis. Aortic valve mean gradient measures 8.2 mmHg. Aortic valve peak gradient measures 15.7 mmHg. Aortic valve area, by VTI measures 1.99 cm. Pulmonic Valve: The pulmonic valve was normal in structure. Pulmonic valve regurgitation is trivial. No evidence of pulmonic stenosis. Aorta: The aortic root is normal in size and structure. There is mild dilatation of the ascending aorta, measuring 39 mm. Venous: The inferior vena cava is normal in size with greater than 50% respiratory variability, suggesting right atrial pressure of 3 mmHg. IAS/Shunts: No atrial level shunt detected by color flow Doppler.  LEFT VENTRICLE PLAX 2D LVIDd:         3.60 cm LVIDs:         1.80 cm LV PW:         1.40 cm LV IVS:        1.10 cm LVOT diam:     2.30 cm LV SV:         64 LV SV Index:   37 LVOT Area:     4.15 cm  LV Volumes (MOD) LV vol d, MOD A2C: 38.7 ml LV vol d, MOD A4C: 42.3 ml LV vol s, MOD A2C: 13.2 ml LV vol s, MOD A4C: 14.4 ml LV SV MOD A2C:     25.5 ml LV SV MOD A4C:     42.3 ml LV SV MOD BP:      26.8 ml RIGHT VENTRICLE RV Basal diam:  4.30 cm RV Mid diam:    2.40 cm RV S prime:     10.60 cm/s TAPSE (M-mode): 1.1 cm LEFT ATRIUM             Index       RIGHT ATRIUM           Index LA Vol (A2C):   37.3 ml 21.34 ml/m RA Area:     17.70 cm LA Vol (A4C):   62.3 ml 35.64 ml/m RA Volume:   45.70 ml  26.15 ml/m LA Biplane Vol: 51.2 ml 29.29 ml/m  AORTIC VALVE  PULMONIC VALVE AV Area (Vmax):    2.18 cm     PV Vmax:       0.86 m/s AV Area (Vmean):   2.25 cm     PV Vmean:      57.300 cm/s AV Area (VTI):     1.99 cm     PV VTI:        0.155 m AV Vmax:           198.25 cm/s  PV Peak grad:  3.0 mmHg AV Vmean:          127.375 cm/s PV Mean grad:  2.0 mmHg AV VTI:            0.324 m AV Peak Grad:      15.7 mmHg AV Mean Grad:      8.2 mmHg LVOT Vmax:         104.00 cm/s LVOT Vmean:        69.000 cm/s LVOT VTI:          0.155 m LVOT/AV VTI ratio: 0.48 AI PHT:            339 msec  AORTA Ao Root diam: 3.30 cm Ao Asc diam:  3.87 cm MR Peak grad:    62.1 mmHg   TRICUSPID VALVE MR Vmax:         394.00 cm/s TR Peak grad:   36.7 mmHg MR PISA:         1.01 cm    TR Vmax:        303.00 cm/s MR PISA Eff ROA: 6 mm MR PISA Radius:  0.40 cm     SHUNTS                              Systemic VTI:  0.16 m                              Systemic Diam: 2.30 cm Mihai Croitoru MD Electronically signed by Sanda Klein MD Signature Date/Time: 08/08/2020/3:15:56 PM    Final    DG CHEST PORT 1 VIEW  Final Result    DG CHEST PORT 1 VIEW  Final Result    DG Chest Port 1 View  Final Result    US Abdomen Limited RUQ (LIVER/GB)  Final Result    CT ABDOMEN PELVIS W CONTRAST  Final Result    DG Chest Portable 1 View  Final Result      Scheduled Meds:  apixaban  5 mg Oral BID   Chlorhexidine Gluconate Cloth  6 each Topical Daily   diltiazem  240 mg Oral Daily   mouth rinse  15 mL Mouth Rinse BID   melatonin  3 mg Oral Once   metoprolol tartrate  50 mg Oral BID   PRN Meds: acetaminophen, hydrALAZINE, labetalol, metoprolol tartrate, ondansetron (ZOFRAN) IV, traMADol, traZODone Continuous Infusions:  cefTRIAXone (ROCEPHIN)  IV Stopped (08/08/20 1411)   diltiazem (CARDIZEM) infusion 5 mg/hr (08/09/20 1209)   metronidazole Stopped (08/09/20 0226)     LOS: 4 days  Time spent: Greater than 50% of the 35 minute visit was spent in counseling/coordination of care for the  patient as laid out in the A&P.   Dwyane Dee, MD [facility] 08/09/2020, 1:34 PM

## 2020-08-09 NOTE — Hospital Course (Addendum)
Barry Contreras is an 87 year old Caucasian male with PMH pancreatic cancer, history of obstructive jaundice status post biliary stent placement 07/04/20, permanent atrial fibrillation on Eliquis, hypertension, prediabetes as well as other comorbidities who presented with a chief complaint of fever, nausea, vomiting, chills, and rigors.   In the ED is found to be tachycardic and tachypneic with a lactic acid of 3.4 and abnormal LFTs.  He underwent a CT of the abdomen and pelvis which showed pneumobilia with slight gallbladder wall enhancement and wall thickening.  A dedicated right upper quadrant ultrasound showed gallbladder wall thickening with no visible stone.   Case was discussed with GI.  Patient was started on antibiotics.  Blood cultures ultimately speciated out Klebsiella. Patient also developed A. fib with RVR during hospitalization.  Cardiology was consulted and he ultimately required initiation of a Cardizem drip. Due to uptrending LFTs, he underwent ERCP on 08/11/20 as well (see below). LFTs continued to trend up post 1st ERCP and plan is for repeat ERCP on 08/14/20.

## 2020-08-09 NOTE — Assessment & Plan Note (Addendum)
-  Likely dilutional drop -iron 17, TIBC of 241, saturation ratios of 7%, ferritin level 333, folate level 14.1, and vitamin B12 level 1477 - CBC daily

## 2020-08-10 ENCOUNTER — Inpatient Hospital Stay: Payer: Medicare PPO

## 2020-08-10 ENCOUNTER — Inpatient Hospital Stay: Payer: Medicare PPO | Admitting: Oncology

## 2020-08-10 ENCOUNTER — Ambulatory Visit (HOSPITAL_COMMUNITY)
Admission: RE | Admit: 2020-08-10 | Discharge: 2020-08-10 | Disposition: A | Discharge status: Home / Self Care | Payer: Medicare PPO | Source: Ambulatory Visit | Attending: Oncology | Admitting: Oncology

## 2020-08-10 DIAGNOSIS — A419 Sepsis, unspecified organism: Secondary | ICD-10-CM | POA: Diagnosis not present

## 2020-08-10 DIAGNOSIS — R7989 Other specified abnormal findings of blood chemistry: Secondary | ICD-10-CM | POA: Diagnosis not present

## 2020-08-10 DIAGNOSIS — I4821 Permanent atrial fibrillation: Secondary | ICD-10-CM | POA: Diagnosis not present

## 2020-08-10 DIAGNOSIS — R7881 Bacteremia: Secondary | ICD-10-CM | POA: Diagnosis not present

## 2020-08-10 LAB — HEPARIN LEVEL (UNFRACTIONATED): Heparin Unfractionated: >1.1 IU/mL — ABNORMAL HIGH (ref 0.30–0.70)

## 2020-08-10 LAB — COMPREHENSIVE METABOLIC PANEL
ALT: 117 U/L — ABNORMAL HIGH (ref 0–44)
AST: 102 U/L — ABNORMAL HIGH (ref 15–41)
Albumin: 2.9 g/dL — ABNORMAL LOW (ref 3.5–5.0)
Alkaline Phosphatase: 564 U/L — ABNORMAL HIGH (ref 38–126)
Anion gap: 13 (ref 5–15)
BUN: 14 mg/dL (ref 8–23)
CO2: 23 mmol/L (ref 22–32)
Calcium: 8.6 mg/dL — ABNORMAL LOW (ref 8.9–10.3)
Chloride: 99 mmol/L (ref 98–111)
Creatinine, Ser: 0.51 mg/dL — ABNORMAL LOW (ref 0.61–1.24)
GFR, Estimated: >60 mL/min (ref >60)
Glucose, Bld: 130 mg/dL — ABNORMAL HIGH (ref 70–99)
Potassium: 3.2 mmol/L — ABNORMAL LOW (ref 3.5–5.1)
Sodium: 135 mmol/L (ref 135–145)
Total Bilirubin: 6.5 mg/dL — ABNORMAL HIGH (ref 0.3–1.2)
Total Protein: 6.1 g/dL — ABNORMAL LOW (ref 6.5–8.1)

## 2020-08-10 LAB — APTT
aPTT: 31 seconds (ref 24–36)
aPTT: 47 seconds — ABNORMAL HIGH (ref 24–36)

## 2020-08-10 LAB — MAGNESIUM: Magnesium: 1.9 mg/dL (ref 1.7–2.4)

## 2020-08-10 MED ORDER — HEPARIN (PORCINE) 25000 UT/250ML-% IV SOLN: 1250.0000 [IU]/h | INTRAVENOUS | Status: DC

## 2020-08-10 MED ORDER — HEPARIN BOLUS VIA INFUSION
2000.0000 [IU] | Freq: Once | INTRAVENOUS | Status: AC
  Administered 2020-08-10: 2000 [IU] via INTRAVENOUS
  Filled 2020-08-10: qty 2000

## 2020-08-10 MED ORDER — POTASSIUM CHLORIDE CRYS ER 20 MEQ PO TBCR
40.0000 meq | EXTENDED_RELEASE_TABLET | Freq: Once | ORAL | Status: AC
  Administered 2020-08-10: 40 meq via ORAL
  Filled 2020-08-10: qty 2

## 2020-08-10 MED ORDER — HEPARIN (PORCINE) 25000 UT/250ML-% IV SOLN
1050.0000 [IU]/h | INTRAVENOUS | Status: DC
  Administered 2020-08-10: 1050 [IU]/h via INTRAVENOUS
  Filled 2020-08-10: qty 250

## 2020-08-10 MED ORDER — TRAMADOL HCL 50 MG PO TABS
50.0000 mg | ORAL_TABLET | Freq: Four times a day (QID) | ORAL | Status: DC | PRN
  Administered 2020-08-11 – 2020-08-16 (×11): 50 mg via ORAL
  Filled 2020-08-10 (×11): qty 1

## 2020-08-10 MED ORDER — METRONIDAZOLE 500 MG PO TABS
500.0000 mg | ORAL_TABLET | Freq: Two times a day (BID) | ORAL | Status: DC
  Administered 2020-08-10 – 2020-08-15 (×11): 500 mg via ORAL
  Filled 2020-08-10 (×11): qty 1

## 2020-08-10 MED ORDER — CEFAZOLIN SODIUM-DEXTROSE 2-4 GM/100ML-% IV SOLN
2.0000 g | Freq: Three times a day (TID) | INTRAVENOUS | Status: DC
  Administered 2020-08-10 – 2020-08-15 (×16): 2 g via INTRAVENOUS
  Filled 2020-08-10 (×16): qty 100

## 2020-08-10 NOTE — Progress Notes (Addendum)
Progress Note  Patient Name: Barry Contreras. Date of Encounter: 08/10/2020  [facility] Cardiologist: Elouise Munroe, MD   Subjective   No acute overnight events. No cardiac complaints this morning. Breathing continues to improve and he feels almost back to baseline. No chest pain, palpitations, lightheadedness/dizziness.   Inpatient Medications    Scheduled Meds:  Chlorhexidine Gluconate Cloth  6 each Topical Daily   diltiazem  240 mg Oral Daily   mouth rinse  15 mL Mouth Rinse BID   melatonin  3 mg Oral Once   metoprolol tartrate  50 mg Oral BID   Continuous Infusions:   ceFAZolin (ANCEF) IV     diltiazem (CARDIZEM) infusion Stopped (08/09/20 1437)   heparin     metronidazole Stopped (08/10/20 0322)   PRN Meds: acetaminophen, hydrALAZINE, labetalol, metoprolol tartrate, ondansetron (ZOFRAN) IV, traMADol, traZODone   Vital Signs    Vitals:   08/10/20 0200 08/10/20 0300 08/10/20 0314 08/10/20 0750  BP: (!) 142/83 (!) 146/90    Pulse: (!) 106 (!) 102    Resp:      Temp:   98.1 F (36.7 C) 98.1 F (36.7 C)  TempSrc:   Axillary Oral  SpO2: 91% 90%    Weight:      Height:        Intake/Output Summary (Last 24 hours) at 08/10/2020 1104 Last data filed at 08/10/2020 0500 Gross per 24 hour  Intake 317.51 ml  Output 2100 ml  Net -1782.49 ml   Last 3 Weights 08/04/2020 07/24/2020 07/13/2020  Weight (lbs) 162 lb 162 lb 3.2 oz 161 lb 6.4 oz  Weight (kg) 73.483 kg 73.573 kg 73.211 kg      Telemetry    Atrial fibrillation with rates ranging from the 100s to 130s. Mostly in the 120s today. - Personally Reviewed  ECG    No new ECG tracing today. - Personally Reviewed  Physical Exam   GEN: No acute distress.   Neck: No JVD. Cardiac: Tachycardic with irregularly irregular rhythm. Soft II/VI systolic murmur. No rubs or gallops. Respiratory: No increased work of breathing. Clear to ausculation bilaterally (decreased breath sounds in bases improved). GI: Soft,  non-distended, and non-tender to palpation. MS: No lower extremity edema. No deformity. Skin: Warm and dry. Neuro:  No focal deficits. Psych: Normal affect.   Labs    High Sensitivity Troponin:   Recent Labs  Lab 08/07/20 2045 08/07/20 2214  TROPONINIHS 12 12      Chemistry Recent Labs  Lab 08/08/20 0257 08/09/20 0247 08/10/20 0251  NA 135 137 135  K 3.0* 3.6 3.2*  CL 102 100 99  CO2 '22 24 23  '$ GLUCOSE 142* 130* 130*  BUN '9 15 14  '$ CREATININE 0.57* 0.57* 0.51*  CALCIUM 8.5* 8.6* 8.6*  PROT 6.6 6.2* 6.1*  ALBUMIN 3.3* 3.1* 2.9*  AST 134* 127* 102*  ALT 117* 124* 117*  ALKPHOS 559* 549* 564*  BILITOT 3.9* 5.7* 6.5*  GFRNONAA >60 >60 >60  ANIONGAP '11 13 13     '$ Hematology Recent Labs  Lab 08/07/20 0458 08/08/20 0257 08/09/20 0247  WBC 12.4* 13.9* 10.8*  RBC 3.43* 3.94* 3.89*  HGB 10.5* 11.8* 11.6*  HCT 32.3* 35.7* 35.6*  MCV 94.2 90.6 91.5  MCH 30.6 29.9 29.8  MCHC 32.5 33.1 32.6  RDW 14.4 14.5 14.6  PLT 260 332 318    BNP Recent Labs  Lab 08/07/20 2045  BNP 371.1*     DDimer No results for input(s):  DDIMER in the last 168 hours.   Radiology    DG CHEST PORT 1 VIEW  Result Date: 08/09/2020 CLINICAL DATA:  Shortness of breath. EXAM: PORTABLE CHEST 1 VIEW COMPARISON:  08/08/2020. FINDINGS: Cardiomegaly with bilateral interstitial prominence slightly progressed from prior exam. Small bilateral pleural effusions slightly progressed from prior exam. Findings consistent with CHF. Pneumonitis cannot be excluded. No pneumothorax. IMPRESSION: Cardiomegaly with bilateral interstitial prominence slightly progressed from prior exam. Small bilateral pleural effusions slightly progressed from prior exam. Findings most consistent with CHF. Electronically Signed   By: Marcello Moores  Register   On: 08/09/2020 07:51   ECHOCARDIOGRAM COMPLETE  Result Date: 08/08/2020    ECHOCARDIOGRAM REPORT   Patient Name:   Barry Contreras Date of Exam: 08/08/2020 Medical Rec #:  [id number]       Height:       62.0 in Accession #:    [id number]     Weight:       162.0 lb Date of Birth:  14-Oct-1931      BSA:          1.748 m Patient Age:    87 years       BP:           128/92 mmHg Patient Gender: M              HR:           76 bpm. Exam Location:  Inpatient Procedure: 2D Echo, Cardiac Doppler and Color Doppler Indications:    Dyspnea  History:        Patient has prior history of Echocardiogram examinations, most                 recent 12/12/2016. Arrythmias:Atrial Fibrillation,                 Signs/Symptoms:Murmur; Risk Factors:Hypertension.  Sonographer:    Luisa Hart RDCS Referring Phys: [id number] Georgina Quint LATIF [place]  1. Left ventricular ejection fraction, by estimation, is 70 to 75%. The left ventricle has hyperdynamic function. The left ventricle has no regional wall motion abnormalities. Left ventricular diastolic function could not be evaluated.  2. Right ventricular systolic function is normal. The right ventricular size is normal. There is mildly elevated pulmonary artery systolic pressure.  3. Left atrial size was mildly dilated.  4. The mitral valve is normal in structure. Mild mitral valve regurgitation. No evidence of mitral stenosis.  5. The aortic valve is tricuspid. There is mild calcification of the aortic valve. There is mild thickening of the aortic valve. Aortic valve regurgitation is mild to moderate. Mild to moderate aortic valve sclerosis/calcification is present, without any evidence of aortic stenosis.  6. There is mild dilatation of the ascending aorta, measuring 39 mm.  7. The inferior vena cava is normal in size with greater than 50% respiratory variability, suggesting right atrial pressure of 3 mmHg. Comparison(s): Prior images reviewed side by side. Degenerative aortic valve changes and the degree of aortic insufficiency is slightly worse. The ascending aorta is not seen as clearly on the current study and was previously measured as larger. FINDINGS  Left Ventricle:  Left ventricular ejection fraction, by estimation, is 70 to 75%. The left ventricle has hyperdynamic function. The left ventricle has no regional wall motion abnormalities. The left ventricular internal cavity size was normal in size. There is no left ventricular hypertrophy. Left ventricular diastolic function could not be evaluated due to atrial fibrillation. Left ventricular diastolic function could not be evaluated.  Right Ventricle: The right ventricular size is normal. No increase in right ventricular wall thickness. Right ventricular systolic function is normal. There is mildly elevated pulmonary artery systolic pressure. The tricuspid regurgitant velocity is 3.03  m/s, and with an assumed right atrial pressure of 3 mmHg, the estimated right ventricular systolic pressure is [id number]. Left Atrium: Left atrial size was mildly dilated. Right Atrium: Right atrial size was normal in size. Pericardium: Trivial pericardial effusion is present. Mitral Valve: The mitral valve is normal in structure. Mild mitral valve regurgitation. No evidence of mitral valve stenosis. Tricuspid Valve: The tricuspid valve is normal in structure. Tricuspid valve regurgitation is mild . No evidence of tricuspid stenosis. Aortic Valve: The aortic valve is tricuspid. There is mild calcification of the aortic valve. There is mild thickening of the aortic valve. Aortic valve regurgitation is mild to moderate. Aortic regurgitation PHT measures 339 msec. Mild to moderate aortic valve sclerosis/calcification is present, without any evidence of aortic stenosis. Aortic valve mean gradient measures 8.2 mmHg. Aortic valve peak gradient measures 15.7 mmHg. Aortic valve area, by VTI measures 1.99 cm. Pulmonic Valve: The pulmonic valve was normal in structure. Pulmonic valve regurgitation is trivial. No evidence of pulmonic stenosis. Aorta: The aortic root is normal in size and structure. There is mild dilatation of the ascending aorta, measuring 39  mm. Venous: The inferior vena cava is normal in size with greater than 50% respiratory variability, suggesting right atrial pressure of 3 mmHg. IAS/Shunts: No atrial level shunt detected by color flow Doppler.  LEFT VENTRICLE PLAX 2D LVIDd:         3.60 cm LVIDs:         1.80 cm LV PW:         1.40 cm LV IVS:        1.10 cm LVOT diam:     2.30 cm LV SV:         64 LV SV Index:   37 LVOT Area:     4.15 cm  LV Volumes (MOD) LV vol d, MOD A2C: 38.7 ml LV vol d, MOD A4C: 42.3 ml LV vol s, MOD A2C: 13.2 ml LV vol s, MOD A4C: 14.4 ml LV SV MOD A2C:     25.5 ml LV SV MOD A4C:     42.3 ml LV SV MOD BP:      26.8 ml RIGHT VENTRICLE RV Basal diam:  4.30 cm RV Mid diam:    2.40 cm RV S prime:     10.60 cm/s TAPSE (M-mode): 1.1 cm LEFT ATRIUM             Index       RIGHT ATRIUM           Index LA Vol (A2C):   37.3 ml 21.34 ml/m RA Area:     17.70 cm LA Vol (A4C):   62.3 ml 35.64 ml/m RA Volume:   45.70 ml  26.15 ml/m LA Biplane Vol: 51.2 ml 29.29 ml/m  AORTIC VALVE                    PULMONIC VALVE AV Area (Vmax):    2.18 cm     PV Vmax:       0.86 m/s AV Area (Vmean):   2.25 cm     PV Vmean:      57.300 cm/s AV Area (VTI):     1.99 cm     PV VTI:  0.155 m AV Vmax:           198.25 cm/s  PV Peak grad:  3.0 mmHg AV Vmean:          127.375 cm/s PV Mean grad:  2.0 mmHg AV VTI:            0.324 m AV Peak Grad:      15.7 mmHg AV Mean Grad:      8.2 mmHg LVOT Vmax:         104.00 cm/s LVOT Vmean:        69.000 cm/s LVOT VTI:          0.155 m LVOT/AV VTI ratio: 0.48 AI PHT:            339 msec  AORTA Ao Root diam: 3.30 cm Ao Asc diam:  3.87 cm MR Peak grad:    62.1 mmHg   TRICUSPID VALVE MR Vmax:         394.00 cm/s TR Peak grad:   36.7 mmHg MR PISA:         1.01 cm    TR Vmax:        303.00 cm/s MR PISA Eff ROA: 6 mm MR PISA Radius:  0.40 cm     SHUNTS                              Systemic VTI:  0.16 m                              Systemic Diam: 2.30 cm Sanda Klein MD Electronically signed by Sanda Klein MD  Signature Date/Time: 08/08/2020/3:15:56 PM    Final     Cardiac Studies   Echocardiogram 08/07/2020: Impressions: 1. Left ventricular ejection fraction, by estimation, is 70 to 75%. The  left ventricle has hyperdynamic function. The left ventricle has no  regional wall motion abnormalities. Left ventricular diastolic function  could not be evaluated.   2. Right ventricular systolic function is normal. The right ventricular  size is normal. There is mildly elevated pulmonary artery systolic  pressure.   3. Left atrial size was mildly dilated.   4. The mitral valve is normal in structure. Mild mitral valve  regurgitation. No evidence of mitral stenosis.   5. The aortic valve is tricuspid. There is mild calcification of the  aortic valve. There is mild thickening of the aortic valve. Aortic valve  regurgitation is mild to moderate. Mild to moderate aortic valve  sclerosis/calcification is present, without  any evidence of aortic stenosis.   6. There is mild dilatation of the ascending aorta, measuring 39 mm.   7. The inferior vena cava is normal in size with greater than 50%  respiratory variability, suggesting right atrial pressure of 3 mmHg.   Comparison(s): Prior images reviewed side by side. Degenerative aortic valve changes and the degree of aortic insufficiency is slightly worse. The ascending aorta is not seen as clearly on the current study and was previously measured as larger.  Patient Profile     87 y.o. male with a history of permanent atrial fibrillation on Eliquis, ascending thoracic aortic aneurysm followed by Dr. [name], hypertension, pancreatic cancer with obstructive juandice s/p biliary stent. Admitted on 08/04/2020 with sepsis secondary to intrabdominal infection. Cardiology consulted on 08/08/2020 for evaluation of atrial fibrillation with RVR at the request of Dr. [name].  Assessment &  Plan    Permanent Atrial Fibrillation - Rates more elevated today mostly in the  120s. - Potassium 3.2 today. Will supplement. - Magnesium 1.9. - TSH normal. - Echo showed LVEF of 70-75% with normal wall motion. - IV Diltiazem was stopped yesterday afternoon. Will restart until ERCP can be done tomorrow.  - Continue PO Diltiazem '240mg'$  daily.  - Continue Lopressor '50mg'$  twice daily. - Given plans for ERCP tomorrow, Eliquis has been stopped and patient now on IV Heparin. Restart Eliquis when OK GI.   Acute Diastolic CHF - Patient developed acute shortness of breath on 08/07/2020. Occurred after receiving IV fluids in setting of sepsis. - BNP 371. - Chest x-ray 8/3 showed cardiomegaly with bilateral interstitial prominence and small bilateral pleural effusions slightly progressed from prior exam. - Has received multiple dose of IV Lasix. Documented urinary output of 2.1 L in the last 24 hours and net negative 1.7 L since admission. No updated weight since admission. Renal function stable.  - Appears euvolemic on exam. No additional Lasix needed. - Continue to monitor volume status closely.   Ascending Thoracic Aorta Aneurysm - Stable at 4.2cm on last chest CTA in 07/2019. - Not clearly seen on Echo this admission but noted to be 3.9 cm. - Follow-up with Dr. [name] as outpatient.   Hypertension - BP still elevated. Systolic BP ranging from the 140s to 170s.  - Restart IV Diltiazem as above. - Continue PO Diltiazem '240mg'$  daily and Lopressor '50mg'$  twice daily.  - Benazepril-HCTZ 20-12.'5mg'$  daily also listed under home medications. Consider restarting home ACE.    Otherwise, per primary team: - Sepsis secondary to intrabdominal infection and Klebsiella bacteremia - Metastatic pancreatic cancer with mets to the liver s/p biliary stent in 06/2020 - Elevated LFTs - Metabolic acidosis - Normocytic anemia - Hypophosphatemia   For questions or updates, please contact [facility] Please consult [email or web address] for contact info under        Signed, Darreld Mclean,  PA-C  08/10/2020, 11:04 AM    Patient seen and examined with Sande Rives, PA-C.  Agree as above, with the following exceptions and changes as noted below.  Patient seen today with his wife at the bedside, he is overall feeling well but feels somewhat physically debilitated due to sitting around for 3 to 4 days in bed.  We discussed in detail involvement of physical therapy when he has resolved the acute medical issues and plan for ongoing physical therapy if he is found to have deficits in his strength.  Gen: NAD, CV: iRRR, no murmurs, Lungs: clear, Abd: soft, Extrem: Warm, well perfused, no edema, Neuro/Psych: alert and oriented x 3, normal mood and affect. All available labs, radiology testing, previous records reviewed.  GI anticipates performing ERCP tomorrow due to LFT abnormalities.  Eliquis has been held and IV heparin started for bridging with a CHA2DS2-VASc score of 4.  Patient is in agreement with this plan and understands this is for stroke risk prevention.  He has had no concerning bleeding thus far.  Continue IV diltiazem through the periprocedural period as this has been very effective at rate control and his heart rate is currently in the 80s with very stable blood pressure.  He is tolerating diltiazem 240 mg well which is an increased dose from his home dose, and metoprolol tartrate 50 mg twice daily.  Continue at these doses for now.  Elouise Munroe, MD 08/10/20 3:48 PM

## 2020-08-10 NOTE — Progress Notes (Signed)
[facility]enterology Progress Note  Barry Contreras. 87 y.o. 12-18-1931   Subjective: Abdominal distention. Abd pain no worse. No family in room during my evaluation.  Objective: Vital signs: Vitals:   08/10/20 0750 08/10/20 1215  BP:    Pulse:    Resp:    Temp: 98.1 F (36.7 C) (!) 97.5 F (36.4 C)  SpO2:    P 102, BP 146/90  Physical Exam: Gen: lethargic, elderly, no acute distress, jaundice HEENT: +icteric sclera CV: RRR Chest: CTA B Abd: RUQ and epigastric tenderness with minimal guarding, mild distention, soft, +BS Ext: no edema  Lab Results: Recent Labs    08/08/20 0257 08/09/20 0247 08/10/20 0251  NA 135 137 135  K 3.0* 3.6 3.2*  CL 102 100 99  CO2 '22 24 23  '$ GLUCOSE 142* 130* 130*  BUN '9 15 14  '$ CREATININE 0.57* 0.57* 0.51*  CALCIUM 8.5* 8.6* 8.6*  MG 2.0 1.9 1.9  PHOS 2.0* 3.2  --    Recent Labs    08/09/20 0247 08/10/20 0251  AST 127* 102*  ALT 124* 117*  ALKPHOS 549* 564*  BILITOT 5.7* 6.5*  PROT 6.2* 6.1*  ALBUMIN 3.1* 2.9*   Recent Labs    08/08/20 0257 08/09/20 0247  WBC 13.9* 10.8*  NEUTROABS 11.0* 8.2*  HGB 11.8* 11.6*  HCT 35.7* 35.6*  MCV 90.6 91.5  PLT 332 318      Assessment/Plan: Metastatic pancreatic cancer with cholangitis - TB, ALP rising despite IV Abx so will need ERCP to re-evaluate patency of metal stent. ERCP scheduled for 11 AM tomorrow by Dr. Watt Climes. Eliquis d/c'd and IV Heparin started per cards recs. IV Heparin will need to be held at [facility] Friday for planned ERCP. Clear liquid diet for dinner and NPO p MN.    Barry Contreras 08/10/2020, 12:17 PM  Questions please call [phone] Patient ID: Barry Contreras., male   DOB: 1931/05/09, 87 y.o.   MRN: [id number]

## 2020-08-10 NOTE — Progress Notes (Signed)
ANTICOAGULATION CONSULT - Brief note  Pharmacy Consult for Heparin Indication: atrial fibrillation  No Known Allergies  Patient Measurements: Height: '5\' 2"'$  (157.5 cm) Weight: 73.5 kg (162 lb) IBW/kg (Calculated) : 54.6 Heparin Dosing Weight: 69.8 kg  Medications:  Infusions:    ceFAZolin (ANCEF) IV Stopped (08/10/20 1447)   diltiazem (CARDIZEM) infusion 7.5 mg/hr (08/10/20 1600)   heparin 1,050 Units/hr (08/10/20 1600)    Assessment: 88 yoM on chronic apixaban for Afib.  Pharmacy has been consulted to dose Heparin around ERCP planned for 8/5. APTT 47, subtherapeutic on heparin 1050 units/hr No bleeding or complications reported by RN.   Goal of Therapy:  Heparin level 0.3-0.7 units/ml Heparin level 66-102 units/ml Monitor platelets by anticoagulation protocol: Yes   Plan:  Give heparin 2000 units bolus IV x 1 Increase to heparin IV infusion at 1250 units/hr Recheck aPTT in 8 hours  Daily aPTT, heparin level, and CBC HOLD heparin at 05:00 on 8/5 prior to ERCP per GI. F/u ability to transition back to apixaban after ERCP  Gretta Arab PharmD, BCPS Clinical Pharmacist [facility] main pharmacy [phone] 08/10/2020 7:32 PM

## 2020-08-10 NOTE — Progress Notes (Signed)
Progress Note    Barry Contreras.   YYT:[id number]  DOB: Oct 07, 1931  DOA: 08/04/2020     5  PCP: Barry Manes, MD  CC: N/V, fever, chills  Hospital Course: Barry Contreras is an 87 year old Caucasian male with PMH pancreatic cancer, history of obstructive jaundice status post biliary stent placement 07/04/20, permanent atrial fibrillation on Eliquis, hypertension, prediabetes as well as other comorbidities who presented with a chief complaint of fever, nausea, vomiting, chills, and rigors.   In the ED is found to be tachycardic and tachypneic with a lactic acid of 3.4 and abnormal LFTs.  He underwent a CT of the abdomen and pelvis which showed pneumobilia with slight gallbladder wall enhancement and wall thickening.  A dedicated right upper quadrant ultrasound showed gallbladder wall thickening with no visible stone.   Case was discussed with GI.  Patient was started on antibiotics.  Blood cultures ultimately speciated out Klebsiella. Patient also developed A. fib with RVR during hospitalization.  Cardiology was consulted and he ultimately required initiation of a Cardizem drip.  Interval History:  No events overnight.  Pain little more controlled today. Discussed labs and he understands plan is now ERCP tomorrow with GI.   ROS: Constitutional: negative for chills and fevers, Respiratory: negative for cough and sputum, Cardiovascular: negative for chest pain, and Gastrointestinal: positive for abdominal pain  Assessment & Plan: * Sepsis (HCC)-resolved as of 08/09/2020 - Patient met sepsis criteria on admission; febrile, tachycardic and had a leukocytosis; source considered bacteremia likely translocation from GI source - see bacteremia   Bacteremia due to Gram-negative bacteria - 4/4 bottles with klebsiella noted on 7/29 cultures - transitioned to Rocephin; goal is 7-10 days treatment; patient will need negative repeat culture after abx prior to chemo port placement   Elevated LFTs -  LFT pattern consistent with either obstructive pattern vs hepatic congestion from dCHF/afib - TB still uptrending on 8/3 and 8/4 - plan is now for ERCP on 8/5  Cancer of head of pancreas ([place]) - stage IV; mets to liver; diagnosed on EUS 07/04/20 s/p biopsy with adenocarcinoma - now s/p CBD stent on 6/28 - tentative plan is patient wishes for chemo for prolonging life some but also wishes to have a quality of life - currently prognosis seems guarded/poor given worsening LFTs but still being worked up  Acute diastolic CHF (congestive heart failure) ([place]) - elevated BNP; CXR shows vascular congestion - etiology possibly from afib with RVR - cardiology following and patient on lasix per cardiology - continue I&O - continue cardizem  - s/p echo: EF 70-75%, unable to grade diastology 2/2 afib  Paroxysmal atrial fibrillation [facility]) - cardiology following as well - has been on cardizem drip; tentative plan is to convert to PO and monitor response  - stop eliquis on 8/4; start heparin drip - hold heparin drip starting 5 am on 8/5 in anticipation for ERCP  Acute respiratory failure with hypoxia ([place]) - continue O2, wean as able   Normocytic anemia -Likely dilutional drop -Check anemia panel and showed an iron level of 17, U IBC of 224, TIBC of 241, saturation ratios of 7%, ferritin level 333, folate level 14.1, and vitamin B12 level 1477 -continue monitor for signs or symptoms of bleeding; currently no overt bleeding noted -Repeat CBC in a.m.  Hypophosphatemia - Replete and recheck as needed  Hypokalemia - Replete and recheck as needed  Hypertension - continue cardizem and lopressor   Old records reviewed in assessment of this patient  Antimicrobials: Cefepime 08/04/2020 >> 08/05/2020 Augmentin 08/07/2020 >> 08/08/2020 Rocephin 08/05/2020 >> 08/09/20 Ancef 08/09/20 >> current   DVT prophylaxis:    Code Status:   Code Status: Full Code Family Communication:   Disposition  Plan: Status is: Inpatient  Remains inpatient appropriate because:IV treatments appropriate due to intensity of illness or inability to take PO and Inpatient level of care appropriate due to severity of illness  Dispo: The patient is from: Home              Anticipated d/c is to: Home              Patient currently is not medically stable to d/c.   Difficult to place patient No  Risk of unplanned readmission score: Unplanned Admission- Pilot do not use: 25.25   Objective: Blood pressure (!) 174/104, pulse (!) 120, temperature (!) 97.5 F (36.4 C), temperature source Oral, resp. rate 15, height _0  (1.575 m), weight 73.5 kg, SpO2 96 %.  Examination: General appearance: alert, cooperative, and no distress Head: Normocephalic, without obvious abnormality, atraumatic Eyes:  EOMI Lungs: clear to auscultation bilaterally Heart: regular rate and rhythm and S1, S2 normal Abdomen:  worst TTP in right quadrants; BS present  Extremities:  trace LE edema Skin: mobility and turgor normal Neurologic: Grossly normal  Consultants:  Oncology Cardiology GI  Procedures:    Data Reviewed: I have personally reviewed following labs and imaging studies Results for orders placed or performed during the hospital encounter of 08/04/20 (from the past 24 hour(s))  Magnesium     Status: None   Collection Time: 08/10/20  2:51 AM  Result Value Ref Range   Magnesium 1.9 1.7 - 2.4 mg/dL  Comprehensive metabolic panel     Status: Abnormal   Collection Time: 08/10/20  2:51 AM  Result Value Ref Range   Sodium 135 135 - 145 mmol/L   Potassium 3.2 (L) 3.5 - 5.1 mmol/L   Chloride 99 98 - 111 mmol/L   CO2 23 22 - 32 mmol/L   Glucose, Bld 130 (H) 70 - 99 mg/dL   BUN 14 8 - 23 mg/dL   Creatinine, Ser 0.51 (L) 0.61 - 1.24 mg/dL   Calcium 8.6 (L) 8.9 - 10.3 mg/dL   Total Protein 6.1 (L) 6.5 - 8.1 g/dL   Albumin 2.9 (L) 3.5 - 5.0 g/dL   AST 102 (H) 15 - 41 U/L   ALT 117 (H) 0 - 44 U/L   Alkaline  Phosphatase 564 (H) 38 - 126 U/L   Total Bilirubin 6.5 (H) 0.3 - 1.2 mg/dL   GFR, Estimated >60 >60 mL/min   Anion gap 13 5 - 15  APTT     Status: None   Collection Time: 08/10/20  8:38 AM  Result Value Ref Range   aPTT 31 24 - 36 seconds  Heparin level (unfractionated)     Status: Abnormal   Collection Time: 08/10/20  8:38 AM  Result Value Ref Range   Heparin Unfractionated >1.10 (H) 0.30 - 0.70 IU/mL    Recent Results (from the past 240 hour(s))  Blood Culture (routine x 2)     Status: Abnormal   Collection Time: 08/04/20  7:18 PM   Specimen: BLOOD  Result Value Ref Range Status   Specimen Description   Final    BLOOD RIGHT ANTECUBITAL Performed at [facility], [address] [place], [place] [address]    Special Requests   Final    BOTTLES DRAWN AEROBIC  AND ANAEROBIC Blood Culture adequate volume Performed at [place] [address], [place], [place] [address]    Culture  Setup Time   Final    GRAM NEGATIVE RODS IN BOTH AEROBIC AND ANAEROBIC BOTTLES CRITICAL VALUE NOTED.  VALUE IS CONSISTENT WITH PREVIOUSLY REPORTED AND CALLED VALUE.    Culture (A)  Final    KLEBSIELLA PNEUMONIAE SUSCEPTIBILITIES PERFORMED ON PREVIOUS CULTURE WITHIN THE LAST 5 DAYS. Performed at [facility], [place] [address] [place], [place] [address]    Report Status 08/07/2020 FINAL  Final  Blood Culture (routine x 2)     Status: Abnormal   Collection Time: 08/04/20  7:25 PM   Specimen: BLOOD  Result Value Ref Range Status   Specimen Description   Final    BLOOD WRIST LEFT Performed at [place] [address], [place], [place] [address]    Special Requests   Final    BOTTLES DRAWN AEROBIC AND ANAEROBIC Blood Culture adequate volume Performed at [place] [address], [place], [place] [address]    Culture  Setup Time   Final    GRAM NEGATIVE RODS IN BOTH AEROBIC AND ANAEROBIC BOTTLES CRITICAL  RESULT CALLED TO, READ BACK BY AND VERIFIED WITH: CHRISTINE SHADE PHARMD _0  08/05/20 EB Performed at [facility], [place] [address], [place], [place] [address]    Culture KLEBSIELLA PNEUMONIAE (A)  Final   Report Status 08/07/2020 FINAL  Final   Organism ID, Bacteria KLEBSIELLA PNEUMONIAE  Final      Susceptibility   Klebsiella pneumoniae - MIC*    AMPICILLIN RESISTANT Resistant     CEFAZOLIN <=4 SENSITIVE Sensitive     CEFEPIME <=0.12 SENSITIVE Sensitive     CEFTAZIDIME <=1 SENSITIVE Sensitive     CEFTRIAXONE <=0.25 SENSITIVE Sensitive     CIPROFLOXACIN <=0.25 SENSITIVE Sensitive     GENTAMICIN <=1 SENSITIVE Sensitive     IMIPENEM <=0.25 SENSITIVE Sensitive     TRIMETH/SULFA <=20 SENSITIVE Sensitive     AMPICILLIN/SULBACTAM 4 SENSITIVE Sensitive     PIP/TAZO <=4 SENSITIVE Sensitive     * KLEBSIELLA PNEUMONIAE  Blood Culture ID Panel (Reflexed)     Status: Abnormal   Collection Time: 08/04/20  7:25 PM  Result Value Ref Range Status   Enterococcus faecalis NOT DETECTED NOT DETECTED Final   Enterococcus Faecium NOT DETECTED NOT DETECTED Final   Listeria monocytogenes NOT DETECTED NOT DETECTED Final   Staphylococcus species NOT DETECTED NOT DETECTED Final   Staphylococcus aureus (BCID) NOT DETECTED NOT DETECTED Final   Staphylococcus epidermidis NOT DETECTED NOT DETECTED Final   Staphylococcus lugdunensis NOT DETECTED NOT DETECTED Final   Streptococcus species NOT DETECTED NOT DETECTED Final   Streptococcus agalactiae NOT DETECTED NOT DETECTED Final   Streptococcus pneumoniae NOT DETECTED NOT DETECTED Final   Streptococcus pyogenes NOT DETECTED NOT DETECTED Final   A.calcoaceticus-baumannii NOT DETECTED NOT DETECTED Final   Bacteroides fragilis NOT DETECTED NOT DETECTED Final   Enterobacterales DETECTED (A) NOT DETECTED Final    Comment: Enterobacterales represent a large order of gram negative bacteria, not a single organism. CRITICAL RESULT CALLED TO, READ BACK BY AND  VERIFIED WITH: CHRISTINE SHADE PHARMD _1  08/05/20 EB    Enterobacter cloacae complex NOT DETECTED NOT DETECTED Final   Escherichia coli NOT DETECTED NOT DETECTED Final   Klebsiella aerogenes NOT DETECTED NOT DETECTED Final   Klebsiella oxytoca NOT DETECTED NOT DETECTED Final   Klebsiella pneumoniae DETECTED (A) NOT  DETECTED Final    Comment: CRITICAL RESULT CALLED TO, READ BACK BY AND VERIFIED WITH: CHRISTINE SHADE PHARMD _0  08/05/20 EB    Proteus species NOT DETECTED NOT DETECTED Final   Salmonella species NOT DETECTED NOT DETECTED Final   Serratia marcescens NOT DETECTED NOT DETECTED Final   Haemophilus influenzae NOT DETECTED NOT DETECTED Final   Neisseria meningitidis NOT DETECTED NOT DETECTED Final   Pseudomonas aeruginosa NOT DETECTED NOT DETECTED Final   Stenotrophomonas maltophilia NOT DETECTED NOT DETECTED Final   Candida albicans NOT DETECTED NOT DETECTED Final   Candida auris NOT DETECTED NOT DETECTED Final   Candida glabrata NOT DETECTED NOT DETECTED Final   Candida krusei NOT DETECTED NOT DETECTED Final   Candida parapsilosis NOT DETECTED NOT DETECTED Final   Candida tropicalis NOT DETECTED NOT DETECTED Final   Cryptococcus neoformans/gattii NOT DETECTED NOT DETECTED Final   CTX-M ESBL NOT DETECTED NOT DETECTED Final   Carbapenem resistance IMP NOT DETECTED NOT DETECTED Final   Carbapenem resistance KPC NOT DETECTED NOT DETECTED Final   Carbapenem resistance NDM NOT DETECTED NOT DETECTED Final   Carbapenem resist OXA 48 LIKE NOT DETECTED NOT DETECTED Final   Carbapenem resistance VIM NOT DETECTED NOT DETECTED Final    Comment: Performed at [facility], [address], [place], [place] [address]  Resp Panel by RT-PCR (Flu A&B, Covid) Nasopharyngeal Swab     Status: None   Collection Time: 08/04/20  7:45 PM   Specimen: Nasopharyngeal Swab; Nasopharyngeal(NP) swabs in vial transport medium  Result Value Ref Range Status   SARS Coronavirus 2 by RT PCR NEGATIVE  NEGATIVE Final    Comment: (NOTE) SARS-CoV-2 target nucleic acids are NOT DETECTED.  The SARS-CoV-2 RNA is generally detectable in upper respiratory specimens during the acute phase of infection. The lowest concentration of SARS-CoV-2 viral copies this assay can detect is 138 copies/mL. A negative result does not preclude SARS-Cov-2 infection and should not be used as the sole basis for treatment or other patient management decisions. A negative result may occur with  improper specimen collection/handling, submission of specimen other than nasopharyngeal swab, presence of viral mutation(s) within the areas targeted by this assay, and inadequate number of viral copies(<138 copies/mL). A negative result must be combined with clinical observations, patient history, and epidemiological information. The expected result is Negative.  Fact Sheet for Patients:  EntrepreneurPulse.com.au  Fact Sheet for Healthcare Providers:  IncredibleEmployment.be  This test is no t yet approved or cleared by the [place] FDA and  has been authorized for detection and/or diagnosis of SARS-CoV-2 by FDA under an Emergency Use Authorization (EUA). This EUA will remain  in effect (meaning this test can be used) for the duration of the COVID-19 declaration under Section 564(b)(1) of the Act, 21 U.S.C.section 360bbb-3(b)(1), unless the authorization is terminated  or revoked sooner.       Influenza A by PCR NEGATIVE NEGATIVE Final   Influenza B by PCR NEGATIVE NEGATIVE Final    Comment: (NOTE) The Xpert Xpress SARS-CoV-2/FLU/RSV plus assay is intended as an aid in the diagnosis of influenza from Nasopharyngeal swab specimens and should not be used as a sole basis for treatment. Nasal washings and aspirates are unacceptable for Xpert Xpress SARS-CoV-2/FLU/RSV testing.  Fact Sheet for Patients: EntrepreneurPulse.com.au  Fact Sheet for Healthcare  Providers: IncredibleEmployment.be  This test is not yet approved or cleared by the [place] FDA and has been authorized for detection and/or diagnosis of SARS-CoV-2 by FDA under an Emergency  Use Authorization (EUA). This EUA will remain in effect (meaning this test can be used) for the duration of the COVID-19 declaration under Section 564(b)(1) of the Act, 21 U.S.C. section 360bbb-3(b)(1), unless the authorization is terminated or revoked.  Performed at [facility], [place] [address], [place], [place] [address]   C Difficile Quick Screen w PCR reflex     Status: None   Collection Time: 08/06/20  1:41 PM   Specimen: STOOL  Result Value Ref Range Status   C Diff antigen NEGATIVE NEGATIVE Final   C Diff toxin NEGATIVE NEGATIVE Final   C Diff interpretation NEGATIVE  Final    Comment: Performed at [facility], [place] [address] [place], [place] [address]  MRSA Next Gen by PCR, Nasal     Status: None   Collection Time: 08/07/20  9:00 PM   Specimen: Nasal Mucosa; Nasal Swab  Result Value Ref Range Status   MRSA by PCR Next Gen NOT DETECTED NOT DETECTED Final    Comment: (NOTE) The GeneXpert MRSA Assay (FDA approved for NASAL specimens only), is one component of a comprehensive MRSA colonization surveillance program. It is not intended to diagnose MRSA infection nor to guide or monitor treatment for MRSA infections. Test performance is not FDA approved in patients less than 58 years old. Performed at [facility] [place] [address] [place], [place] [address]      Radiology Studies: DG CHEST PORT 1 VIEW  Result Date: 08/09/2020 CLINICAL DATA:  Shortness of breath. EXAM: PORTABLE CHEST 1 VIEW COMPARISON:  08/08/2020. FINDINGS: Cardiomegaly with bilateral interstitial prominence slightly progressed from prior exam. Small bilateral pleural effusions slightly progressed from prior exam. Findings consistent with  CHF. Pneumonitis cannot be excluded. No pneumothorax. IMPRESSION: Cardiomegaly with bilateral interstitial prominence slightly progressed from prior exam. Small bilateral pleural effusions slightly progressed from prior exam. Findings most consistent with CHF. Electronically Signed   By: Marcello Moores  Register   On: 08/09/2020 07:51   ECHOCARDIOGRAM COMPLETE  Result Date: 08/08/2020    ECHOCARDIOGRAM REPORT   Patient Name:   Barry Contreras Date of Exam: 08/08/2020 Medical Rec #:  [id number]      Height:       62.0 in Accession #:    [id number]     Weight:       162.0 lb Date of Birth:  June 08, 1931      BSA:          1.748 m Patient Age:    73 years       BP:           128/92 mmHg Patient Gender: M              HR:           76 bpm. Exam Location:  Inpatient Procedure: 2D Echo, Cardiac Doppler and Color Doppler Indications:    Dyspnea  History:        Patient has prior history of Echocardiogram examinations, most                 recent 12/12/2016. Arrythmias:Atrial Fibrillation,                 Signs/Symptoms:Murmur; Risk Factors:Hypertension.  Sonographer:    Luisa Hart RDCS Referring Phys: [id number] Georgina Quint LATIF [place]  1. Left ventricular ejection fraction, by estimation, is 70 to 75%. The left ventricle has hyperdynamic function. The left ventricle has no regional wall motion abnormalities. Left ventricular diastolic function  could not be evaluated.  2. Right ventricular systolic function is normal. The right ventricular size is normal. There is mildly elevated pulmonary artery systolic pressure.  3. Left atrial size was mildly dilated.  4. The mitral valve is normal in structure. Mild mitral valve regurgitation. No evidence of mitral stenosis.  5. The aortic valve is tricuspid. There is mild calcification of the aortic valve. There is mild thickening of the aortic valve. Aortic valve regurgitation is mild to moderate. Mild to moderate aortic valve sclerosis/calcification is present, without any evidence of  aortic stenosis.  6. There is mild dilatation of the ascending aorta, measuring 39 mm.  7. The inferior vena cava is normal in size with greater than 50% respiratory variability, suggesting right atrial pressure of 3 mmHg. Comparison(s): Prior images reviewed side by side. Degenerative aortic valve changes and the degree of aortic insufficiency is slightly worse. The ascending aorta is not seen as clearly on the current study and was previously measured as larger. FINDINGS  Left Ventricle: Left ventricular ejection fraction, by estimation, is 70 to 75%. The left ventricle has hyperdynamic function. The left ventricle has no regional wall motion abnormalities. The left ventricular internal cavity size was normal in size. There is no left ventricular hypertrophy. Left ventricular diastolic function could not be evaluated due to atrial fibrillation. Left ventricular diastolic function could not be evaluated. Right Ventricle: The right ventricular size is normal. No increase in right ventricular wall thickness. Right ventricular systolic function is normal. There is mildly elevated pulmonary artery systolic pressure. The tricuspid regurgitant velocity is 3.03  m/s, and with an assumed right atrial pressure of 3 mmHg, the estimated right ventricular systolic pressure is 62.8 mmHg. Left Atrium: Left atrial size was mildly dilated. Right Atrium: Right atrial size was normal in size. Pericardium: Trivial pericardial effusion is present. Mitral Valve: The mitral valve is normal in structure. Mild mitral valve regurgitation. No evidence of mitral valve stenosis. Tricuspid Valve: The tricuspid valve is normal in structure. Tricuspid valve regurgitation is mild . No evidence of tricuspid stenosis. Aortic Valve: The aortic valve is tricuspid. There is mild calcification of the aortic valve. There is mild thickening of the aortic valve. Aortic valve regurgitation is mild to moderate. Aortic regurgitation PHT measures 339 msec.  Mild to moderate aortic valve sclerosis/calcification is present, without any evidence of aortic stenosis. Aortic valve mean gradient measures 8.2 mmHg. Aortic valve peak gradient measures 15.7 mmHg. Aortic valve area, by VTI measures 1.99 cm. Pulmonic Valve: The pulmonic valve was normal in structure. Pulmonic valve regurgitation is trivial. No evidence of pulmonic stenosis. Aorta: The aortic root is normal in size and structure. There is mild dilatation of the ascending aorta, measuring 39 mm. Venous: The inferior vena cava is normal in size with greater than 50% respiratory variability, suggesting right atrial pressure of 3 mmHg. IAS/Shunts: No atrial level shunt detected by color flow Doppler.  LEFT VENTRICLE PLAX 2D LVIDd:         3.60 cm LVIDs:         1.80 cm LV PW:         1.40 cm LV IVS:        1.10 cm LVOT diam:     2.30 cm LV SV:         64 LV SV Index:   37 LVOT Area:     4.15 cm  LV Volumes (MOD) LV vol d, MOD A2C: 38.7 ml LV vol d, MOD A4C: 42.3 ml  LV vol s, MOD A2C: 13.2 ml LV vol s, MOD A4C: 14.4 ml LV SV MOD A2C:     25.5 ml LV SV MOD A4C:     42.3 ml LV SV MOD BP:      26.8 ml RIGHT VENTRICLE RV Basal diam:  4.30 cm RV Mid diam:    2.40 cm RV S prime:     10.60 cm/s TAPSE (M-mode): 1.1 cm LEFT ATRIUM             Index       RIGHT ATRIUM           Index LA Vol (A2C):   37.3 ml 21.34 ml/m RA Area:     17.70 cm LA Vol (A4C):   62.3 ml 35.64 ml/m RA Volume:   45.70 ml  26.15 ml/m LA Biplane Vol: 51.2 ml 29.29 ml/m  AORTIC VALVE                    PULMONIC VALVE AV Area (Vmax):    2.18 cm     PV Vmax:       0.86 m/s AV Area (Vmean):   2.25 cm     PV Vmean:      57.300 cm/s AV Area (VTI):     1.99 cm     PV VTI:        0.155 m AV Vmax:           198.25 cm/s  PV Peak grad:  3.0 mmHg AV Vmean:          127.375 cm/s PV Mean grad:  2.0 mmHg AV VTI:            0.324 m AV Peak Grad:      15.7 mmHg AV Mean Grad:      8.2 mmHg LVOT Vmax:         104.00 cm/s LVOT Vmean:        69.000 cm/s LVOT VTI:           0.155 m LVOT/AV VTI ratio: 0.48 AI PHT:            339 msec  AORTA Ao Root diam: 3.30 cm Ao Asc diam:  3.87 cm MR Peak grad:    62.1 mmHg   TRICUSPID VALVE MR Vmax:         394.00 cm/s TR Peak grad:   36.7 mmHg MR PISA:         1.01 cm    TR Vmax:        303.00 cm/s MR PISA Eff ROA: 6 mm MR PISA Radius:  0.40 cm     SHUNTS                              Systemic VTI:  0.16 m                              Systemic Diam: 2.30 cm Dani Gobble Croitoru MD Electronically signed by Sanda Klein MD Signature Date/Time: 08/08/2020/3:15:56 PM    Final    DG CHEST PORT 1 VIEW  Final Result    DG CHEST PORT 1 VIEW  Final Result    DG Chest Port 1 View  Final Result    US Abdomen Limited RUQ (LIVER/GB)  Final Result    CT ABDOMEN PELVIS W CONTRAST  Final Result  DG Chest Portable 1 View  Final Result      Scheduled Meds:  Chlorhexidine Gluconate Cloth  6 each Topical Daily   diltiazem  240 mg Oral Daily   mouth rinse  15 mL Mouth Rinse BID   melatonin  3 mg Oral Once   metoprolol tartrate  50 mg Oral BID   metroNIDAZOLE  500 mg Oral Q12H   PRN Meds: acetaminophen, hydrALAZINE, labetalol, metoprolol tartrate, ondansetron (ZOFRAN) IV, traMADol, traZODone Continuous Infusions:   ceFAZolin (ANCEF) IV     diltiazem (CARDIZEM) infusion 10 mg/hr (08/10/20 1200)   heparin 1,050 Units/hr (08/10/20 1200)     LOS: 5 days  Time spent: Greater than 50% of the 35 minute visit was spent in counseling/coordination of care for the patient as laid out in the A&P.   Dwyane Dee, MD [facility] 08/10/2020, 1:16 PM

## 2020-08-10 NOTE — Progress Notes (Signed)
IP PROGRESS NOTE  Subjective:   [name] has no complaint today.  No pain at present. Objective: Vital signs in last 24 hours: Blood pressure (!) 146/90, pulse (!) 102, temperature 98.1 F (36.7 C), temperature source Axillary, resp. rate (!) 21, height '5\' 2"'$  (1.575 m), weight 162 lb (73.5 kg), SpO2 90 %.  Intake/Output from previous day: 08/03 0701 - 08/04 0700 In: 700.7 [P.O.:120; I.V.:182.8; IV Piggyback:397.9] Out: 2100 [Urine:2100]  Physical Exam:  HEENT: No thrush, sclera icteric Lungs: Clear bilaterally Cardiac: Irregular, tachycardia Abdomen: Mildly distended, no mass, no hepatomegaly Extremities: No leg edema    Lab Results: Recent Labs    08/08/20 0257 08/09/20 0247  WBC 13.9* 10.8*  HGB 11.8* 11.6*  HCT 35.7* 35.6*  PLT 332 318    BMET Recent Labs    08/09/20 0247 08/10/20 0251  NA 137 135  K 3.6 3.2*  CL 100 99  CO2 24 23  GLUCOSE 130* 130*  BUN 15 14  CREATININE 0.57* 0.51*  CALCIUM 8.6* 8.6*    No results found for: CEA1, CEA, [id number], CA125  Studies/Results: DG CHEST PORT 1 VIEW  Result Date: 08/09/2020 CLINICAL DATA:  Shortness of breath. EXAM: PORTABLE CHEST 1 VIEW COMPARISON:  08/08/2020. FINDINGS: Cardiomegaly with bilateral interstitial prominence slightly progressed from prior exam. Small bilateral pleural effusions slightly progressed from prior exam. Findings consistent with CHF. Pneumonitis cannot be excluded. No pneumothorax. IMPRESSION: Cardiomegaly with bilateral interstitial prominence slightly progressed from prior exam. Small bilateral pleural effusions slightly progressed from prior exam. Findings most consistent with CHF. Electronically Signed   By: Marcello Moores  Register   On: 08/09/2020 07:51   DG CHEST PORT 1 VIEW  Result Date: 08/08/2020 CLINICAL DATA:  Recent diagnosis of pancreatic cancer. Fever malaise. Nausea and vomiting. EXAM: PORTABLE CHEST 1 VIEW COMPARISON:  Chest x-ray 08/07/2020. FINDINGS: Cardiomegaly with bilateral  interstitial prominence in small bilateral pleural effusions again noted. Findings again consistent with CHF. Pneumonitis cannot be excluded. No pneumothorax. IMPRESSION: Cardiomegaly with bilateral interstitial prominence and small bilateral pleural effusions again noted. Findings again consistent with CHF. Pneumonitis cannot be excluded. Similar findings noted on prior study of 08/07/2020. Electronically Signed   By: Marcello Moores  Register   On: 08/08/2020 08:53   ECHOCARDIOGRAM COMPLETE  Result Date: 08/08/2020    ECHOCARDIOGRAM REPORT   Patient Name:   Barry Contreras Date of Exam: 08/08/2020 Medical Rec #:  [id number]      Height:       62.0 in Accession #:    [id number]     Weight:       162.0 lb Date of Birth:  1931/11/15      BSA:          1.748 m Patient Age:    87 years       BP:           128/92 mmHg Patient Gender: M              HR:           76 bpm. Exam Location:  Inpatient Procedure: 2D Echo, Cardiac Doppler and Color Doppler Indications:    Dyspnea  History:        Patient has prior history of Echocardiogram examinations, most                 recent 12/12/2016. Arrythmias:Atrial Fibrillation,                 Signs/Symptoms:Murmur; Risk Factors:Hypertension.  Sonographer:  TAMARA CROWN RDCS Referring Phys: [id number] Georgina Quint LATIF [place]  1. Left ventricular ejection fraction, by estimation, is 70 to 75%. The left ventricle has hyperdynamic function. The left ventricle has no regional wall motion abnormalities. Left ventricular diastolic function could not be evaluated.  2. Right ventricular systolic function is normal. The right ventricular size is normal. There is mildly elevated pulmonary artery systolic pressure.  3. Left atrial size was mildly dilated.  4. The mitral valve is normal in structure. Mild mitral valve regurgitation. No evidence of mitral stenosis.  5. The aortic valve is tricuspid. There is mild calcification of the aortic valve. There is mild thickening of the aortic valve. Aortic  valve regurgitation is mild to moderate. Mild to moderate aortic valve sclerosis/calcification is present, without any evidence of aortic stenosis.  6. There is mild dilatation of the ascending aorta, measuring 39 mm.  7. The inferior vena cava is normal in size with greater than 50% respiratory variability, suggesting right atrial pressure of 3 mmHg. Comparison(s): Prior images reviewed side by side. Degenerative aortic valve changes and the degree of aortic insufficiency is slightly worse. The ascending aorta is not seen as clearly on the current study and was previously measured as larger. FINDINGS  Left Ventricle: Left ventricular ejection fraction, by estimation, is 70 to 75%. The left ventricle has hyperdynamic function. The left ventricle has no regional wall motion abnormalities. The left ventricular internal cavity size was normal in size. There is no left ventricular hypertrophy. Left ventricular diastolic function could not be evaluated due to atrial fibrillation. Left ventricular diastolic function could not be evaluated. Right Ventricle: The right ventricular size is normal. No increase in right ventricular wall thickness. Right ventricular systolic function is normal. There is mildly elevated pulmonary artery systolic pressure. The tricuspid regurgitant velocity is 3.03  m/s, and with an assumed right atrial pressure of 3 mmHg, the estimated right ventricular systolic pressure is [id number]. Left Atrium: Left atrial size was mildly dilated. Right Atrium: Right atrial size was normal in size. Pericardium: Trivial pericardial effusion is present. Mitral Valve: The mitral valve is normal in structure. Mild mitral valve regurgitation. No evidence of mitral valve stenosis. Tricuspid Valve: The tricuspid valve is normal in structure. Tricuspid valve regurgitation is mild . No evidence of tricuspid stenosis. Aortic Valve: The aortic valve is tricuspid. There is mild calcification of the aortic valve. There is  mild thickening of the aortic valve. Aortic valve regurgitation is mild to moderate. Aortic regurgitation PHT measures 339 msec. Mild to moderate aortic valve sclerosis/calcification is present, without any evidence of aortic stenosis. Aortic valve mean gradient measures 8.2 mmHg. Aortic valve peak gradient measures 15.7 mmHg. Aortic valve area, by VTI measures 1.99 cm. Pulmonic Valve: The pulmonic valve was normal in structure. Pulmonic valve regurgitation is trivial. No evidence of pulmonic stenosis. Aorta: The aortic root is normal in size and structure. There is mild dilatation of the ascending aorta, measuring 39 mm. Venous: The inferior vena cava is normal in size with greater than 50% respiratory variability, suggesting right atrial pressure of 3 mmHg. IAS/Shunts: No atrial level shunt detected by color flow Doppler.  LEFT VENTRICLE PLAX 2D LVIDd:         3.60 cm LVIDs:         1.80 cm LV PW:         1.40 cm LV IVS:        1.10 cm LVOT diam:     2.30 cm LV SV:  64 LV SV Index:   37 LVOT Area:     4.15 cm  LV Volumes (MOD) LV vol d, MOD A2C: 38.7 ml LV vol d, MOD A4C: 42.3 ml LV vol s, MOD A2C: 13.2 ml LV vol s, MOD A4C: 14.4 ml LV SV MOD A2C:     25.5 ml LV SV MOD A4C:     42.3 ml LV SV MOD BP:      26.8 ml RIGHT VENTRICLE RV Basal diam:  4.30 cm RV Mid diam:    2.40 cm RV S prime:     10.60 cm/s TAPSE (M-mode): 1.1 cm LEFT ATRIUM             Index       RIGHT ATRIUM           Index LA Vol (A2C):   37.3 ml 21.34 ml/m RA Area:     17.70 cm LA Vol (A4C):   62.3 ml 35.64 ml/m RA Volume:   45.70 ml  26.15 ml/m LA Biplane Vol: 51.2 ml 29.29 ml/m  AORTIC VALVE                    PULMONIC VALVE AV Area (Vmax):    2.18 cm     PV Vmax:       0.86 m/s AV Area (Vmean):   2.25 cm     PV Vmean:      57.300 cm/s AV Area (VTI):     1.99 cm     PV VTI:        0.155 m AV Vmax:           198.25 cm/s  PV Peak grad:  3.0 mmHg AV Vmean:          127.375 cm/s PV Mean grad:  2.0 mmHg AV VTI:            0.324 m AV  Peak Grad:      15.7 mmHg AV Mean Grad:      8.2 mmHg LVOT Vmax:         104.00 cm/s LVOT Vmean:        69.000 cm/s LVOT VTI:          0.155 m LVOT/AV VTI ratio: 0.48 AI PHT:            339 msec  AORTA Ao Root diam: 3.30 cm Ao Asc diam:  3.87 cm MR Peak grad:    62.1 mmHg   TRICUSPID VALVE MR Vmax:         394.00 cm/s TR Peak grad:   36.7 mmHg MR PISA:         1.01 cm    TR Vmax:        303.00 cm/s MR PISA Eff ROA: 6 mm MR PISA Radius:  0.40 cm     SHUNTS                              Systemic VTI:  0.16 m                              Systemic Diam: 2.30 cm Dani Gobble Croitoru MD Electronically signed by Sanda Klein MD Signature Date/Time: 08/08/2020/3:15:56 PM    Final     Medications: I have reviewed the patient's current medications.  Assessment/Plan: Pancreas cancer, clinical stage IV (cT2,cN1,cM1) CT abdomen/pelvis 05/29/2020 pancreas head  mass versus focal pancreatitis MRI abdomen 06/17/2020-hypoenhancing mass in the pancreas head, 3.7 x 3 cm, lesion abuts the anterior surface of the portal vein, subtle 1 cm hypoenhancing segment 6 lesion suspicious for metastasis, no lymphadenopathy, distended gallbladder containing sludge and probable tiny gallstones EUS 07/04/2020, 10 mm malignant appearing peripancreatic lymph node, 35 x 25 mm pancreas head mass, abutment of the portal vein and superior mesenteric vein, no invasion of the SMA or celiac trunk, upstream pancreatic duct dilation, FNA biopsy of the pancreas head mass-adenocarcinoma Ultrasound liver 07/20/2020-liver lesion could not be seen, biopsy procedure aborted CT 08/04/2020-new peripheral right liver lesion, subtle low density lesion in the inferior right liver at the site of a hypoenhancing lesion on MRI, no biliary duct dilatation, pancreas head mass Obstructive jaundice secondary to #1-ERCP 07/04/2020-low to mid duct stricture, uncovered metal stent placed in the common bile duct Atrial fibrillation-maintained on apixaban  anticoagulation Glaucoma Aortic aneurysm Admission 08/04/2020 with a fever and hyperbilirubinemia-blood cultures positive for Klebsiella  Barry Contreras appears unchanged.  The bilirubin is higher again today.  He is scheduled for an ERCP tomorrow.  Recommendations: Management of biliary obstruction per GI Antibiotics per the medical service Please call oncology as needed, Port-A-Cath placement and chemotherapy will be delayed until he has recovered from the biliary obstruction and bacteremia I will be out until 08/14/2020, outpatient follow-up will be scheduled at the Cancer center    LOS: 5 days   Betsy Coder, MD   08/10/2020, 6:58 AM

## 2020-08-10 NOTE — Progress Notes (Addendum)
ANTICOAGULATION CONSULT NOTE - Initial Consult  Pharmacy Consult for apixaban >> heparin Indication: atrial fibrillation  No Known Allergies  Patient Measurements: Height: '5\' 2"'$  (157.5 cm) Weight: 73.5 kg (162 lb) IBW/kg (Calculated) : 54.6 Heparin Dosing Weight: 69.8 kg  Vital Signs: Temp: 98.1 F (36.7 C) (08/04 0314) Temp Source: Axillary (08/04 0314) BP: 146/90 (08/04 0300) Pulse Rate: 102 (08/04 0300)  Labs: Recent Labs    08/07/20 2045 08/07/20 2214 08/08/20 0257 08/09/20 0247 08/10/20 0251  HGB  --   --  11.8* 11.6*  --   HCT  --   --  35.7* 35.6*  --   PLT  --   --  332 318  --   CREATININE  --   --  0.57* 0.57* 0.51*  TROPONINIHS 12 12  --   --   --     Estimated Creatinine Clearance: 58.3 mL/min (A) (by C-G formula based on SCr of 0.51 mg/dL (L)).   Medical History: Past Medical History:  Diagnosis Date   Atrial fibrillation, permanent ([place])    Cancer ([place])    Diverticulitis    Family history of breast cancer 07/27/2020   Family history of colon cancer 07/27/2020   Hyperglycemia    Hypertension    Inguinal hernia    Macular degeneration    Mitral regurgitation    Umbilical hernia     Medications:  Scheduled:   Chlorhexidine Gluconate Cloth  6 each Topical Daily   diltiazem  240 mg Oral Daily   mouth rinse  15 mL Mouth Rinse BID   melatonin  3 mg Oral Once   metoprolol tartrate  50 mg Oral BID    Assessment: 87 yo male with metastatic prostate cancer s/p biliary stent in June 2022.  GI planning for ERCP on 8/5 given elevated LFTs.  On apixaban PTA for atrial fibrillation.  Pharmacy consulted to switch apixaban to heparin drip for procedure.  Last dose apixaban given 8/3 @ 2134. CBC stable. Baseline HL >1.1 due to recent apixaban. BL aPTT 31 seconds  Goal of Therapy:  Heparin level 0.3-0.7 units/ml aPTT 66-102 seconds Monitor platelets by anticoagulation protocol: Yes   Plan:  No heparin bolus given recent apixaban administration Start  heparin infusion at 1050 units/hr @ 0900 (~12 hours since last apixa dose) F/u 8 hour aPTT Monitor aPTT and HL until correlating given recent apixaban dose F/u ability to transition back to apixaban after ERCP  Dimple Nanas, PharmD 08/10/2020,8:10 AM

## 2020-08-10 NOTE — Assessment & Plan Note (Addendum)
-   continue cardizem and lopressor -Blood pressure was initially low/normal throughout admission.  Pressure has since recovered and ERCPs have been performed.  Blood pressure still adequate - Resume benazepril-HCTZ on 08/15/2020

## 2020-08-11 ENCOUNTER — Inpatient Hospital Stay (HOSPITAL_COMMUNITY): Payer: Medicare PPO | Admitting: Certified Registered Nurse Anesthetist

## 2020-08-11 ENCOUNTER — Encounter (HOSPITAL_COMMUNITY): Payer: Self-pay | Admitting: Internal Medicine

## 2020-08-11 ENCOUNTER — Encounter (HOSPITAL_COMMUNITY)
Admission: EM | Disposition: A | Discharge status: Home Health | Payer: Self-pay | Source: Home / Self Care | Attending: Internal Medicine

## 2020-08-11 ENCOUNTER — Inpatient Hospital Stay (HOSPITAL_COMMUNITY): Payer: Medicare PPO

## 2020-08-11 HISTORY — PX: REMOVAL OF STONES: SHX5545

## 2020-08-11 HISTORY — PX: ERCP: SHX5425

## 2020-08-11 LAB — APTT: aPTT: 65 seconds — ABNORMAL HIGH (ref 24–36)

## 2020-08-11 LAB — COMPREHENSIVE METABOLIC PANEL
ALT: 98 U/L — ABNORMAL HIGH (ref 0–44)
AST: 85 U/L — ABNORMAL HIGH (ref 15–41)
Albumin: 2.9 g/dL — ABNORMAL LOW (ref 3.5–5.0)
Alkaline Phosphatase: 602 U/L — ABNORMAL HIGH (ref 38–126)
Anion gap: 7 (ref 5–15)
BUN: 13 mg/dL (ref 8–23)
CO2: 22 mmol/L (ref 22–32)
Calcium: 8.6 mg/dL — ABNORMAL LOW (ref 8.9–10.3)
Chloride: 105 mmol/L (ref 98–111)
Creatinine, Ser: 0.46 mg/dL — ABNORMAL LOW (ref 0.61–1.24)
GFR, Estimated: >60 mL/min (ref >60)
Glucose, Bld: 118 mg/dL — ABNORMAL HIGH (ref 70–99)
Potassium: 3.5 mmol/L (ref 3.5–5.1)
Sodium: 134 mmol/L — ABNORMAL LOW (ref 135–145)
Total Bilirubin: 8.3 mg/dL — ABNORMAL HIGH (ref 0.3–1.2)
Total Protein: 5.9 g/dL — ABNORMAL LOW (ref 6.5–8.1)

## 2020-08-11 LAB — MAGNESIUM: Magnesium: 2 mg/dL (ref 1.7–2.4)

## 2020-08-11 LAB — HEPARIN LEVEL (UNFRACTIONATED): Heparin Unfractionated: 0.75 IU/mL — ABNORMAL HIGH (ref 0.30–0.70)

## 2020-08-11 SURGERY — ERCP, WITH INTERVENTION IF INDICATED
Anesthesia: General

## 2020-08-11 MED ORDER — FENTANYL CITRATE (PF) 100 MCG/2ML IJ SOLN
INTRAMUSCULAR | Status: AC
  Filled 2020-08-11: qty 2

## 2020-08-11 MED ORDER — DEXAMETHASONE SODIUM PHOSPHATE 4 MG/ML IJ SOLN
INTRAMUSCULAR | Status: DC | PRN
  Administered 2020-08-11: 4 mg via INTRAVENOUS

## 2020-08-11 MED ORDER — FENTANYL CITRATE (PF) 100 MCG/2ML IJ SOLN
INTRAMUSCULAR | Status: DC | PRN
  Administered 2020-08-11 (×4): 25 ug via INTRAVENOUS

## 2020-08-11 MED ORDER — LIDOCAINE 2% (20 MG/ML) 5 ML SYRINGE
INTRAMUSCULAR | Status: DC | PRN
  Administered 2020-08-11: 40 mg via INTRAVENOUS

## 2020-08-11 MED ORDER — PHENYLEPHRINE 40 MCG/ML (10ML) SYRINGE FOR IV PUSH (FOR BLOOD PRESSURE SUPPORT)
PREFILLED_SYRINGE | INTRAVENOUS | Status: DC | PRN
  Administered 2020-08-11: 120 ug via INTRAVENOUS
  Administered 2020-08-11 (×2): 80 ug via INTRAVENOUS

## 2020-08-11 MED ORDER — GLUCAGON HCL RDNA (DIAGNOSTIC) 1 MG IJ SOLR
INTRAMUSCULAR | Status: AC
  Filled 2020-08-11: qty 1

## 2020-08-11 MED ORDER — LACTATED RINGERS IV SOLN: INTRAVENOUS | Status: DC

## 2020-08-11 MED ORDER — PROPOFOL 10 MG/ML IV BOLUS
INTRAVENOUS | Status: DC | PRN
  Administered 2020-08-11: 20 mg via INTRAVENOUS
  Administered 2020-08-11: 100 mg via INTRAVENOUS

## 2020-08-11 MED ORDER — SODIUM CHLORIDE 0.9 % IV SOLN
INTRAVENOUS | Status: DC | PRN
  Administered 2020-08-11: 45 mL

## 2020-08-11 MED ORDER — SUCCINYLCHOLINE CHLORIDE 200 MG/10ML IV SOSY
PREFILLED_SYRINGE | INTRAVENOUS | Status: DC | PRN
  Administered 2020-08-11: 100 mg via INTRAVENOUS

## 2020-08-11 MED ORDER — HEPARIN (PORCINE) 25000 UT/250ML-% IV SOLN
1600.0000 [IU]/h | INTRAVENOUS | Status: DC
  Administered 2020-08-11 – 2020-08-12 (×2): 1250 [IU]/h via INTRAVENOUS
  Administered 2020-08-13: 1600 [IU]/h via INTRAVENOUS
  Administered 2020-08-13: 1400 [IU]/h via INTRAVENOUS
  Filled 2020-08-11 (×6): qty 250

## 2020-08-11 MED ORDER — PHENYLEPHRINE HCL (PRESSORS) 10 MG/ML IV SOLN: INTRAVENOUS | Status: DC | PRN

## 2020-08-11 MED ORDER — INDOMETHACIN 50 MG RE SUPP
RECTAL | Status: AC
  Filled 2020-08-11: qty 2

## 2020-08-11 NOTE — Op Note (Signed)
[facility] Patient Name: Barry Contreras Procedure Date: 08/11/2020 MRN: [id number] Attending MD: Clarene Essex , MD Date of Birth: [birth year] CSN: [id number] Age: 87 Admit Type: Outpatient Procedure:                ERCP Indications:              Abnormal abdominal CT, Elevated liver enzymes and                            patient with signs of cholangitis and history of                            recent biliary stent for pancreatic cancer                            obstruction Providers:                Clarene Essex, MD, Carlyn Reichert, RN, Cherylynn Ridges,                            Technician, Dellie Catholic Referring MD:              Medicines:                General Anesthesia Complications:            No immediate complications. Estimated Blood Loss:     Estimated blood loss: none. Procedure:                Pre-Anesthesia Assessment:                           - Prior to the procedure, a History and Physical                            was performed, and patient medications and                            allergies were reviewed. The patient's tolerance of                            previous anesthesia was also reviewed. The risks                            and benefits of the procedure and the sedation                            options and risks were discussed with the patient.                            All questions were answered, and informed consent                            was obtained. Prior Anticoagulants: The patient has                            taken heparin, last dose  was day of procedure. ASA                            Grade Assessment: III - A patient with severe                            systemic disease. After reviewing the risks and                            benefits, the patient was deemed in satisfactory                            condition to undergo the procedure.                           After obtaining informed consent, the scope was                             passed under direct vision. Throughout the                            procedure, the patient's blood pressure, pulse, and                            oxygen saturations were monitored continuously. The                            [id number]) Olympus duodenoscope was                            introduced through the mouth, and used to inject                            contrast into and used to cannulate the bile duct.                            The ERCP was accomplished without difficulty. The                            patient tolerated the procedure well. Scope In: Scope Out: Findings:      One uncovered metal stent originating in the biliary tree was emerging       from the major papilla. The stent was partially occluded. There was also       signs of some tumor growth around the stent with possibly some minimal       tumor ingrowth and deep selective cannulation was readily obtained and       the wire was advanced into the intrahepatics and some dye was injected       to confirm that there was no proximal obstruction and we were able to       fill half the stent nicely with contrast but could not well visualize       the mid to distal part of the stent and the biliary tree was swept with       an adjustable  9- 12 mm balloon starting at the bifurcation and left main       hepatic duct. Sludge was swept from the duct. Multiple balloon       pull-through's were done using both sizes of balloon and we proceeded       with multiple occlusion cholangiograms but again could not get great       filling of the lower part of the stent in the 9 mm balloon passed       readily through the stent and the 12 had some resistance but would pass       and after our evaluation his stent seem to be patent and draining       adequately and although he might need a stent within his stent in the       future it was elected to not place it at this time and there was no       pancreatic duct  injection or wire advancement throughout the procedure       and the wire the balloon catheter and the scope was removed and the       patient tolerated the procedure well Impression:               - One partially occluded stent from the biliary                            tree was seen in the major papilla. There was some                            minimal tumor ingrowth                           - The biliary tree was swept and sludge was found.                            With no residual sludge or debris on multiple                            subsequent balloon pull-through's Moderate Sedation:      Not Applicable - Patient had care per Anesthesia. Recommendation:           - Clear liquid diet for 6 hours. If doing well this                            evening may have soft solids                           - Resume Eliquis (apixaban) at prior dose today. If                            doing well otherwise can resume heparin drip if you                            prefer                           - Return to GI clinic PRN.                           -  Check liver enzymes (AST, ALT, alkaline                            phosphatase, bilirubin) tomorrow and follow back to                            near normal as an outpatient.                           - Telephone GI clinic if symptomatic PRN. Hopefully                            he will not need a stent inside the stent but would                            probably proceed with that if this cleaning does                            not help or recurs Procedure Code(s):        --- Professional ---                           [phone], Endoscopic retrograde                            cholangiopancreatography (ERCP); with removal of                            calculi/debris from biliary/pancreatic duct(s) Diagnosis Code(s):        --- Professional ---                           [id number], Other mechanical complication of bile                             duct prosthesis, initial encounter                           R74.8, Abnormal levels of other serum enzymes                           R93.5, Abnormal findings on diagnostic imaging of                            other abdominal regions, including retroperitoneum CPT copyright 2019 [employer]. All rights reserved. The codes documented in this report are preliminary and upon coder review may  be revised to meet current compliance requirements. Clarene Essex, MD 08/11/2020 12:17:22 PM This report has been signed electronically. Number of Addenda: 0

## 2020-08-11 NOTE — TOC Progression Note (Signed)
Transition of Care (TOC) - Progression Note    Patient Details  Name: Barry Contreras. MRN: [id number] Date of Birth: [birth year]  Transition of Care [facility]) CM/SW Contact  Leeroy Cha, RN Phone Number: 08/11/2020, 9:18 AM  Clinical Narrative:    87 year old Caucasian male with PMH pancreatic cancer, history of obstructive jaundice status post biliary stent placement 07/04/20, permanent atrial fibrillation on Eliquis, hypertension, prediabetes as well as other comorbidities who presented with a chief complaint of fever, nausea, vomiting, chills, and rigors.   In the ED is found to be tachycardic and tachypneic with a lactic acid of 3.4 and abnormal LFTs.  He underwent a CT of the abdomen and pelvis which showed pneumobilia with slight gallbladder wall enhancement and wall thickening.  A dedicated right upper quadrant ultrasound showed gallbladder wall thickening with no visible stone.   Case was discussed with GI.  Patient was started on antibiotics.  Blood cultures ultimately speciated out Klebsiella. Patient also developed A. fib with RVR during hospitalization.  Cardiology was consulted and he ultimately required initiation of a Cardizem drip. TOC PLAN OF CARE: Remains on the Cardizem drip, iv ancef, liver enz remain elevated. Following for progression and toc needs.  Expected Discharge Plan: Home/Self Care Barriers to Discharge: Continued Medical Work up  Expected Discharge Plan and Services Expected Discharge Plan: Home/Self Care   Discharge Planning Services: CM Consult   Living arrangements for the past 2 months: Single Family Home                                       Social Determinants of Health (SDOH) Interventions    Readmission Risk Interventions No flowsheet data found.

## 2020-08-11 NOTE — Anesthesia Postprocedure Evaluation (Signed)
Anesthesia Post Note  Patient: Barry Contreras.  Procedure(s) Performed: ENDOSCOPIC RETROGRADE CHOLANGIOPANCREATOGRAPHY (ERCP) REMOVAL OF STONES     Patient location during evaluation: PACU Anesthesia Type: General Level of consciousness: awake and alert, oriented and patient cooperative Pain management: pain level controlled Vital Signs Assessment: post-procedure vital signs reviewed and stable Respiratory status: spontaneous breathing, nonlabored ventilation and respiratory function stable Cardiovascular status: blood pressure returned to baseline and stable Postop Assessment: no apparent nausea or vomiting Anesthetic complications: no   No notable events documented.  Last Vitals:  Vitals:   08/11/20 1220 08/11/20 1230  BP: 112/71 111/76  Pulse:  79  Resp: 18 15  Temp:    SpO2: 98% 95%    Last Pain:  Vitals:   08/11/20 1230  TempSrc:   PainSc: 0-No pain                 Pervis Hocking

## 2020-08-11 NOTE — Transfer of Care (Signed)
Immediate Anesthesia Transfer of Care Note  Patient: Barry Contreras.  Procedure(s) Performed: ENDOSCOPIC RETROGRADE CHOLANGIOPANCREATOGRAPHY (ERCP) REMOVAL OF STONES  Patient Location: PACU  Anesthesia Type:General  Level of Consciousness: drowsy  Airway & Oxygen Therapy: Patient Spontanous Breathing and Patient connected to face mask  Post-op Assessment: Report given to RN and Post -op Vital signs reviewed and stable  Post vital signs: Reviewed and stable  Last Vitals:  Vitals Value Taken Time  BP 99/56 08/11/20 1210  Temp    Pulse    Resp 19 08/11/20 1210  SpO2 94 % 08/11/20 1210    Last Pain:  Vitals:   08/11/20 1041  TempSrc: Oral  PainSc: 0-No pain      Patients Stated Pain Goal: 0 ([id number])  Complications: No notable events documented.

## 2020-08-11 NOTE — Anesthesia Preprocedure Evaluation (Addendum)
Anesthesia Evaluation  Patient identified by MRN, date of birth, ID band Patient awake    Reviewed: Allergy & Precautions, NPO status , Patient's Chart, lab work & pertinent test results, reviewed documented beta blocker date and time   Airway Mallampati: II  TM Distance: >3 FB Neck ROM: Full    Dental no notable dental hx. (+) Teeth Intact, Dental Advisory Given   Pulmonary former smoker,  Quit smoking 1970   Pulmonary exam normal breath sounds clear to auscultation       Cardiovascular hypertension, Pt. on medications and Pt. on home beta blockers +CHF (diastolic)  + dysrhythmias (eliquis, AFib RVR on admission d/t sepsis- cardizem gtt) Atrial Fibrillation + Valvular Problems/Murmurs (mild-mod AI, mild MR) AI and MR  Rhythm:Irregular Rate:Normal  AfibRVR on admission, on dilt gtt now- HR 70s-80s  Echo 08/08/20: 1. Left ventricular ejection fraction, by estimation, is 70 to 75%. The  left ventricle has hyperdynamic function. The left ventricle has no  regional wall motion abnormalities. Left ventricular diastolic function  could not be evaluated.  2. Right ventricular systolic function is normal. The right ventricular  size is normal. There is mildly elevated pulmonary artery systolic  pressure.  3. Left atrial size was mildly dilated.  4. The mitral valve is normal in structure. Mild mitral valve  regurgitation. No evidence of mitral stenosis.  5. The aortic valve is tricuspid. There is mild calcification of the aortic valve. There is mild thickening of the aortic valve. Aortic valve regurgitation is mild to moderate. Mild to moderate aortic valve  sclerosis/calcification is present, without  any evidence of aortic stenosis.  6. There is mild dilatation of the ascending aorta, measuring 39 mm.  7. The inferior vena cava is normal in size with greater than 50% respiratory variability, suggesting right atrial pressure of 3  mmHg.    Neuro/Psych negative neurological ROS  negative psych ROS   GI/Hepatic negative GI ROS, Hx pancreatic cancer, history of obstructive jaundice status post biliary stent placement 07/04/20- p/w sepsis 2/2 stent   Endo/Other  diabetes (pre-diabetic)  Renal/GU negative Renal ROS  negative genitourinary   Musculoskeletal negative musculoskeletal ROS (+)   Abdominal   Peds negative pediatric ROS (+)  Hematology  (+) Blood dyscrasia, anemia , hct 35.6   Anesthesia Other Findings Visibly jaundice  Reproductive/Obstetrics negative OB ROS                            Anesthesia Physical Anesthesia Plan  ASA: 4  Anesthesia Plan: General   Post-op Pain Management:    Induction: Intravenous  PONV Risk Score and Plan: 2 and Ondansetron, Dexamethasone and Treatment may vary due to age or medical condition  Airway Management Planned: Oral ETT  Additional Equipment: None  Intra-op Plan:   Post-operative Plan: Extubation in OR  Informed Consent: I have reviewed the patients History and Physical, chart, labs and discussed the procedure including the risks, benefits and alternatives for the proposed anesthesia with the patient or authorized representative who has indicated his/her understanding and acceptance.     Dental advisory given  Plan Discussed with: CRNA  Anesthesia Plan Comments:        Anesthesia Quick Evaluation

## 2020-08-11 NOTE — Progress Notes (Signed)
ANTICOAGULATION CONSULT - Brief note  Pharmacy Consult for Heparin Indication: atrial fibrillation  No Known Allergies  Patient Measurements: Height: '5\' 2"'$  (157.5 cm) Weight: 73.5 kg (162 lb) IBW/kg (Calculated) : 54.6 Heparin Dosing Weight: 69.8 kg  Medications:  Infusions:    ceFAZolin (ANCEF) IV Stopped (08/11/20 [id number])   diltiazem (CARDIZEM) infusion 5 mg/hr (08/11/20 1125)   lactated ringers 10 mL/hr at 08/11/20 1125    Assessment: 35 yoM on chronic apixaban for Afib.  Pharmacy has been consulted to dose Heparin around ERCP planned for 8/5.  08/11/2020: AM APTT 65 sec, just below therapeutic range on heparin 1250 units/hr Heparin level 0.75- remains slightly elevated due to apixaban No bleeding or complications reported by RN.  Heparin off at [facility] for ERCP  To resume heparin s/p ERCP  Goal of Therapy:  Heparin level 0.3-0.7 units/ml Heparin level 66-102 units/ml Monitor platelets by anticoagulation protocol: Yes   Plan:  Resume heparin 1250 units/hr (no bolus) Check PTT and heparin level 8hrs after resuming Daily PTT, heparin level and CBC Follow up ability to resume apixaban  Peggyann Juba, PharmD, BCPS Pharmacy: [phone] 08/11/2020 2:10 PM

## 2020-08-11 NOTE — Anesthesia Procedure Notes (Signed)
Procedure Name: Intubation Date/Time: 08/11/2020 11:30 AM Performed by: Claudia Desanctis, CRNA Pre-anesthesia Checklist: Patient identified, Emergency Drugs available, Suction available and Patient being monitored Patient Re-evaluated:Patient Re-evaluated prior to induction Oxygen Delivery Method: Circle system utilized Preoxygenation: Pre-oxygenation with 100% oxygen Induction Type: IV induction Ventilation: Mask ventilation without difficulty Laryngoscope Size: 2 and Miller Grade View: Grade I Tube type: Oral Tube size: 7.0 mm Number of attempts: 1 Airway Equipment and Method: Stylet Placement Confirmation: ETT inserted through vocal cords under direct vision, positive ETCO2 and breath sounds checked- equal and bilateral Secured at: 22 cm Tube secured with: Tape Dental Injury: Teeth and Oropharynx as per pre-operative assessment

## 2020-08-11 NOTE — Progress Notes (Signed)
ANTICOAGULATION CONSULT - Brief note  Pharmacy Consult for Heparin Indication: atrial fibrillation  No Known Allergies  Patient Measurements: Height: '5\' 2"'$  (157.5 cm) Weight: 73.5 kg (162 lb) IBW/kg (Calculated) : 54.6 Heparin Dosing Weight: 69.8 kg  Medications:  Infusions:    ceFAZolin (ANCEF) IV Stopped (08/10/20 2151)   diltiazem (CARDIZEM) infusion 7.5 mg/hr (08/11/20 0314)   heparin 1,250 Units/hr (08/10/20 2300)    Assessment: 88 yoM on chronic apixaban for Afib.  Pharmacy has been consulted to dose Heparin around ERCP planned for 8/5.  08/11/2020: APTT 65 sec, just below therapeutic range on heparin 1250 units/hr Heparin level 0.75- remains slightly elevated due to apixaban No bleeding or complications reported by RN.   Goal of Therapy:  Heparin level 0.3-0.7 units/ml Heparin level 66-102 units/ml Monitor platelets by anticoagulation protocol: Yes   Plan:  HOLD heparin at 05:00 on 8/5 prior to ERCP per GI-->RN holding now F/u ability to transition back to apixaban after ERCP  Netta Cedars, PharmD, BCPS 08/11/2020 4:38 AM

## 2020-08-11 NOTE — Progress Notes (Signed)
Progress Note    Barry Contreras.   JSE:[id number]  DOB: [birth year]  DOA: 08/04/2020     6  PCP: Lajean Manes, MD  CC: N/V, fever, chills  Hospital Course: [name] is an 87 year old Caucasian male with PMH pancreatic cancer, history of obstructive jaundice status post biliary stent placement 07/04/20, permanent atrial fibrillation on Eliquis, hypertension, prediabetes as well as other comorbidities who presented with a chief complaint of fever, nausea, vomiting, chills, and rigors.   In the ED is found to be tachycardic and tachypneic with a lactic acid of 3.4 and abnormal LFTs.  He underwent a CT of the abdomen and pelvis which showed pneumobilia with slight gallbladder wall enhancement and wall thickening.  A dedicated right upper quadrant ultrasound showed gallbladder wall thickening with no visible stone.   Case was discussed with GI.  Patient was started on antibiotics.  Blood cultures ultimately speciated out Klebsiella. Patient also developed A. fib with RVR during hospitalization.  Cardiology was consulted and he ultimately required initiation of a Cardizem drip. Due to uptrending LFTs, he underwent ERCP on 08/11/20 as well (see below).   Interval History:  Underwent ERCP this morning. Seen in room prior to procedure; was doing about the same which was lethargic and having some right sided abd pain still ongoing.   ROS: Constitutional: negative for chills and fevers, Respiratory: negative for cough and sputum, Cardiovascular: negative for chest pain, and Gastrointestinal: positive for abdominal pain  Assessment & Plan: * Sepsis (HCC)-resolved as of 08/09/2020 - Patient met sepsis criteria on admission; febrile, tachycardic and had a leukocytosis; source considered bacteremia likely translocation from GI source - see bacteremia   Bacteremia due to Gram-negative bacteria - 4/4 bottles with klebsiella noted on 7/29 cultures - transitioned to Rocephin; goal is 10 days  treatment (currently D8); patient will need negative repeat culture after abx prior to chemo port placement as well  Elevated LFTs - LFT pattern consistent with obstructive pattern as seen on ERCP 8/5 with sludge partially occluding biliary stent - TB up to 8.3 on 8/5 prior to ERCP - per report, sludge partially occluding stent which was removed; tumor ingrowth noted as well and may still require stent within stent - due to findings on ERCP; continue heparin drip until LFTs/TB show persistent downtrend in case needs repeat procedure  Cancer of head of pancreas ([place]) - stage IV; mets to liver; diagnosed on EUS 07/04/20 s/p biopsy with adenocarcinoma - now s/p CBD stent on 6/28 - tentative plan is patient wishes for chemo for prolonging life some but also wishes to have a quality of life - currently prognosis seems guarded/poor  Acute diastolic CHF (congestive heart failure) ([place]) - elevated BNP; CXR shows vascular congestion - etiology possibly from afib with RVR - cardiology following and patient on lasix per cardiology - continue I&O - continue cardizem  - s/p echo: EF 70-75%, unable to grade diastology 2/2 afib  Permanent atrial fibrillation [facility]) - cardiology following as well - has been on cardizem drip; tentative plan is to convert to PO and monitor response  - continue holding eliquis; continue heparin drip; see LFTs for reasoning   Acute respiratory failure with hypoxia ([place]) - continue O2, wean as able   Normocytic anemia -Likely dilutional drop -Check anemia panel and showed an iron level of 17, U IBC of 224, TIBC of 241, saturation ratios of 7%, ferritin level 333, folate level 14.1, and vitamin B12 level 1477 -continue monitor for signs or  symptoms of bleeding; currently no overt bleeding noted -Repeat CBC in a.m.  Hypophosphatemia - Replete and recheck as needed  Hypokalemia - Replete and recheck as needed  Hypertension - continue cardizem and lopressor - BP  low/normal so will continue holding benazepril-hctz for now but will resume as able    Old records reviewed in assessment of this patient  Antimicrobials: Cefepime 08/04/2020 >> 08/05/2020 Augmentin 08/07/2020 >> 08/08/2020 Rocephin 08/05/2020 >> 08/09/20 Ancef 08/09/20 >> current   DVT prophylaxis:    Code Status:   Code Status: Full Code Family Communication:   Disposition Plan: Status is: Inpatient  Remains inpatient appropriate because:IV treatments appropriate due to intensity of illness or inability to take PO and Inpatient level of care appropriate due to severity of illness  Dispo: The patient is from: Home              Anticipated d/c is to: Home              Patient currently is not medically stable to d/c.   Difficult to place patient No  Risk of unplanned readmission score: Unplanned Admission- Pilot do not use: 29.61   Objective: Blood pressure 109/69, pulse 69, temperature 98.6 F (37 C), temperature source Oral, resp. rate 20, height '5\' 2"'  (1.575 m), weight 73.5 kg, SpO2 94 %.  Examination: General appearance: alert, cooperative, and no distress Head: Normocephalic, without obvious abnormality, atraumatic Eyes:  EOMI Lungs: clear to auscultation bilaterally Heart: regular rate and rhythm and S1, S2 normal Abdomen:  worst TTP in right quadrants; BS present  Extremities:  trace LE edema Skin: mobility and turgor normal Neurologic: Grossly normal  Consultants:  Oncology Cardiology GI  Procedures:    Data Reviewed: I have personally reviewed following labs and imaging studies Results for orders placed or performed during the hospital encounter of 08/04/20 (from the past 24 hour(s))  APTT     Status: Abnormal   Collection Time: 08/10/20  6:48 PM  Result Value Ref Range   aPTT 47 (H) 24 - 36 seconds  Magnesium     Status: None   Collection Time: 08/11/20  4:05 AM  Result Value Ref Range   Magnesium 2.0 1.7 - 2.4 mg/dL  Comprehensive metabolic panel      Status: Abnormal   Collection Time: 08/11/20  4:05 AM  Result Value Ref Range   Sodium 134 (L) 135 - 145 mmol/L   Potassium 3.5 3.5 - 5.1 mmol/L   Chloride 105 98 - 111 mmol/L   CO2 22 22 - 32 mmol/L   Glucose, Bld 118 (H) 70 - 99 mg/dL   BUN 13 8 - 23 mg/dL   Creatinine, Ser 0.46 (L) 0.61 - 1.24 mg/dL   Calcium 8.6 (L) 8.9 - 10.3 mg/dL   Total Protein 5.9 (L) 6.5 - 8.1 g/dL   Albumin 2.9 (L) 3.5 - 5.0 g/dL   AST 85 (H) 15 - 41 U/L   ALT 98 (H) 0 - 44 U/L   Alkaline Phosphatase 602 (H) 38 - 126 U/L   Total Bilirubin 8.3 (H) 0.3 - 1.2 mg/dL   GFR, Estimated >60 >60 mL/min   Anion gap 7 5 - 15  Heparin level (unfractionated)     Status: Abnormal   Collection Time: 08/11/20  4:05 AM  Result Value Ref Range   Heparin Unfractionated 0.75 (H) 0.30 - 0.70 IU/mL  APTT     Status: Abnormal   Collection Time: 08/11/20  4:05 AM  Result Value Ref  Range   aPTT 65 (H) 24 - 36 seconds    Recent Results (from the past 240 hour(s))  Blood Culture (routine x 2)     Status: Abnormal   Collection Time: 08/04/20  7:18 PM   Specimen: BLOOD  Result Value Ref Range Status   Specimen Description   Final    BLOOD RIGHT ANTECUBITAL Performed at [facility] [address] [place], [place] [address]    Special Requests   Final    BOTTLES DRAWN AEROBIC AND ANAEROBIC Blood Culture adequate volume Performed at [facility] [address] [place], [place] [address]    Culture  Setup Time   Final    GRAM NEGATIVE RODS IN BOTH AEROBIC AND ANAEROBIC BOTTLES CRITICAL VALUE NOTED.  VALUE IS CONSISTENT WITH PREVIOUSLY REPORTED AND CALLED VALUE.    Culture (A)  Final    KLEBSIELLA PNEUMONIAE SUSCEPTIBILITIES PERFORMED ON PREVIOUS CULTURE WITHIN THE LAST 5 DAYS. Performed at [facility], [place] [address] [place], [place] [address]    Report Status 08/07/2020 FINAL  Final  Blood Culture (routine x 2)     Status: Abnormal   Collection Time: 08/04/20  7:25 PM    Specimen: BLOOD  Result Value Ref Range Status   Specimen Description   Final    BLOOD WRIST LEFT Performed at [facility] [address] [place], [place] [address]    Special Requests   Final    BOTTLES DRAWN AEROBIC AND ANAEROBIC Blood Culture adequate volume Performed at [facility] [address] [place], [place] [address]    Culture  Setup Time   Final    GRAM NEGATIVE RODS IN BOTH AEROBIC AND ANAEROBIC BOTTLES CRITICAL RESULT CALLED TO, READ BACK BY AND VERIFIED WITH: CHRISTINE SHADE PHARMD '@1249'  08/05/20 EB Performed at [facility], [address], [place],  [address]    Culture KLEBSIELLA PNEUMONIAE (A)  Final   Report Status 08/07/2020 FINAL  Final   Organism ID, Bacteria KLEBSIELLA PNEUMONIAE  Final      Susceptibility   Klebsiella pneumoniae - MIC*    AMPICILLIN RESISTANT Resistant     CEFAZOLIN <=4 SENSITIVE Sensitive     CEFEPIME <=0.12 SENSITIVE Sensitive     CEFTAZIDIME <=1 SENSITIVE Sensitive     CEFTRIAXONE <=0.25 SENSITIVE Sensitive     CIPROFLOXACIN <=0.25 SENSITIVE Sensitive     GENTAMICIN <=1 SENSITIVE Sensitive     IMIPENEM <=0.25 SENSITIVE Sensitive     TRIMETH/SULFA <=20 SENSITIVE Sensitive     AMPICILLIN/SULBACTAM 4 SENSITIVE Sensitive     PIP/TAZO <=4 SENSITIVE Sensitive     * KLEBSIELLA PNEUMONIAE  Blood Culture ID Panel (Reflexed)     Status: Abnormal   Collection Time: 08/04/20  7:25 PM  Result Value Ref Range Status   Enterococcus faecalis NOT DETECTED NOT DETECTED Final   Enterococcus Faecium NOT DETECTED NOT DETECTED Final   Listeria monocytogenes NOT DETECTED NOT DETECTED Final   Staphylococcus species NOT DETECTED NOT DETECTED Final   Staphylococcus aureus (BCID) NOT DETECTED NOT DETECTED Final   Staphylococcus epidermidis NOT DETECTED NOT DETECTED Final   Staphylococcus lugdunensis NOT DETECTED NOT DETECTED Final   Streptococcus species NOT DETECTED NOT DETECTED Final    Streptococcus agalactiae NOT DETECTED NOT DETECTED Final   Streptococcus pneumoniae NOT DETECTED NOT DETECTED Final   Streptococcus pyogenes NOT DETECTED NOT DETECTED Final   A.calcoaceticus-baumannii NOT DETECTED NOT DETECTED Final   Bacteroides fragilis NOT  DETECTED NOT DETECTED Final   Enterobacterales DETECTED (A) NOT DETECTED Final    Comment: Enterobacterales represent a large order of gram negative bacteria, not a single organism. CRITICAL RESULT CALLED TO, READ BACK BY AND VERIFIED WITH: CHRISTINE SHADE PHARMD '@1249'  08/05/20 EB    Enterobacter cloacae complex NOT DETECTED NOT DETECTED Final   Escherichia coli NOT DETECTED NOT DETECTED Final   Klebsiella aerogenes NOT DETECTED NOT DETECTED Final   Klebsiella oxytoca NOT DETECTED NOT DETECTED Final   Klebsiella pneumoniae DETECTED (A) NOT DETECTED Final    Comment: CRITICAL RESULT CALLED TO, READ BACK BY AND VERIFIED WITH: CHRISTINE SHADE PHARMD '@1249'  08/05/20 EB    Proteus species NOT DETECTED NOT DETECTED Final   Salmonella species NOT DETECTED NOT DETECTED Final   Serratia marcescens NOT DETECTED NOT DETECTED Final   Haemophilus influenzae NOT DETECTED NOT DETECTED Final   Neisseria meningitidis NOT DETECTED NOT DETECTED Final   Pseudomonas aeruginosa NOT DETECTED NOT DETECTED Final   Stenotrophomonas maltophilia NOT DETECTED NOT DETECTED Final   Candida albicans NOT DETECTED NOT DETECTED Final   Candida auris NOT DETECTED NOT DETECTED Final   Candida glabrata NOT DETECTED NOT DETECTED Final   Candida krusei NOT DETECTED NOT DETECTED Final   Candida parapsilosis NOT DETECTED NOT DETECTED Final   Candida tropicalis NOT DETECTED NOT DETECTED Final   Cryptococcus neoformans/gattii NOT DETECTED NOT DETECTED Final   CTX-M ESBL NOT DETECTED NOT DETECTED Final   Carbapenem resistance IMP NOT DETECTED NOT DETECTED Final   Carbapenem resistance KPC NOT DETECTED NOT DETECTED Final   Carbapenem resistance NDM NOT DETECTED NOT DETECTED  Final   Carbapenem resist OXA 48 LIKE NOT DETECTED NOT DETECTED Final   Carbapenem resistance VIM NOT DETECTED NOT DETECTED Final    Comment: Performed at [facility], [address] [place], [place] [address]  Resp Panel by RT-PCR (Flu A&B, Covid) Nasopharyngeal Swab     Status: None   Collection Time: 08/04/20  7:45 PM   Specimen: Nasopharyngeal Swab; Nasopharyngeal(NP) swabs in vial transport medium  Result Value Ref Range Status   SARS Coronavirus 2 by RT PCR NEGATIVE NEGATIVE Final    Comment: (NOTE) SARS-CoV-2 target nucleic acids are NOT DETECTED.  The SARS-CoV-2 RNA is generally detectable in upper respiratory specimens during the acute phase of infection. The lowest concentration of SARS-CoV-2 viral copies this assay can detect is 138 copies/mL. A negative result does not preclude SARS-Cov-2 infection and should not be used as the sole basis for treatment or other patient management decisions. A negative result may occur with  improper specimen collection/handling, submission of specimen other than nasopharyngeal swab, presence of viral mutation(s) within the areas targeted by this assay, and inadequate number of viral copies(<138 copies/mL). A negative result must be combined with clinical observations, patient history, and epidemiological information. The expected result is Negative.  Fact Sheet for Patients:  EntrepreneurPulse.com.au  Fact Sheet for Healthcare Providers:  IncredibleEmployment.be  This test is no t yet approved or cleared by the [place] FDA and  has been authorized for detection and/or diagnosis of SARS-CoV-2 by FDA under an Emergency Use Authorization (EUA). This EUA will remain  in effect (meaning this test can be used) for the duration of the COVID-19 declaration under Section 564(b)(1) of the Act, 21 U.S.C.section 360bbb-3(b)(1), unless the authorization is terminated  or revoked sooner.        Influenza A by PCR NEGATIVE NEGATIVE Final   Influenza B by PCR NEGATIVE NEGATIVE  Final    Comment: (NOTE) The Xpert Xpress SARS-CoV-2/FLU/RSV plus assay is intended as an aid in the diagnosis of influenza from Nasopharyngeal swab specimens and should not be used as a sole basis for treatment. Nasal washings and aspirates are unacceptable for Xpert Xpress SARS-CoV-2/FLU/RSV testing.  Fact Sheet for Patients: EntrepreneurPulse.com.au  Fact Sheet for Healthcare Providers: IncredibleEmployment.be  This test is not yet approved or cleared by the [place] FDA and has been authorized for detection and/or diagnosis of SARS-CoV-2 by FDA under an Emergency Use Authorization (EUA). This EUA will remain in effect (meaning this test can be used) for the duration of the COVID-19 declaration under Section 564(b)(1) of the Act, 21 U.S.C. section 360bbb-3(b)(1), unless the authorization is terminated or revoked.  Performed at [facility], [place] [address] [place], [place] [address]   C Difficile Quick Screen w PCR reflex     Status: None   Collection Time: 08/06/20  1:41 PM   Specimen: STOOL  Result Value Ref Range Status   C Diff antigen NEGATIVE NEGATIVE Final   C Diff toxin NEGATIVE NEGATIVE Final   C Diff interpretation NEGATIVE  Final    Comment: Performed at [facility], [place] [address], [place], [place] [address]  MRSA Next Gen by PCR, Nasal     Status: None   Collection Time: 08/07/20  9:00 PM   Specimen: Nasal Mucosa; Nasal Swab  Result Value Ref Range Status   MRSA by PCR Next Gen NOT DETECTED NOT DETECTED Final    Comment: (NOTE) The GeneXpert MRSA Assay (FDA approved for NASAL specimens only), is one component of a comprehensive MRSA colonization surveillance program. It is not intended to diagnose MRSA infection nor to guide or monitor treatment for MRSA infections. Test performance is not FDA  approved in patients less than 24 years old. Performed at [facility], [place] [address], [place], [place] [address]      Radiology Studies: DG ERCP BILIARY & PANCREATIC DUCTS  Result Date: 08/11/2020 CLINICAL DATA:  Biliary obstruction. EXAM: ERCP TECHNIQUE: Multiple spot images obtained with the fluoroscopic device and submitted for interpretation post-procedure. FLUOROSCOPY TIME:  Fluoroscopy Time:  3 minutes and 20 seconds. Radiation Exposure Index (if provided by the fluoroscopic device): Not reported Number of Acquired Spot Images: 5 COMPARISON:  CT Abdomen Pelvis, 08/04/2020.  ERCP, 07/04/2020 FINDINGS: Pre-existing metallic biliary stent. Multiple, limited oblique views of the right upper quadrant demonstrating endoscopy common bile duct cannulation and retrograde cholangiogram. No discrete filling defect demonstrated. IMPRESSION: Fluoroscopic imaging during ERCP and retrograde cholangiogram, as above. Please see dictated report for intraprocedural findings. These images were submitted for radiologic interpretation only. Please see the procedural report for the amount of contrast and the fluoroscopy dose. Electronically Signed   By: Michaelle Birks MD   On: 08/11/2020 13:10   DG ERCP BILIARY & PANCREATIC DUCTS  Final Result    DG CHEST PORT 1 VIEW  Final Result    DG CHEST PORT 1 VIEW  Final Result    DG Chest Port 1 View  Final Result    US Abdomen Limited RUQ (LIVER/GB)  Final Result    CT ABDOMEN PELVIS W CONTRAST  Final Result    DG Chest Portable 1 View  Final Result      Scheduled Meds:  Chlorhexidine Gluconate Cloth  6 each Topical Daily   diltiazem  240 mg Oral Daily   mouth rinse  15 mL Mouth Rinse BID  melatonin  3 mg Oral Once   metoprolol tartrate  50 mg Oral BID   metroNIDAZOLE  500 mg Oral Q12H   PRN Meds: acetaminophen, hydrALAZINE, labetalol, metoprolol tartrate, ondansetron (ZOFRAN) IV, traMADol, traZODone Continuous Infusions:    ceFAZolin (ANCEF) IV 2 g (08/11/20 1411)   diltiazem (CARDIZEM) infusion 5 mg/hr (08/11/20 1125)   lactated ringers 10 mL/hr at 08/11/20 1125     LOS: 6 days  Time spent: Greater than 50% of the 35 minute visit was spent in counseling/coordination of care for the patient as laid out in the A&P.   Dwyane Dee, MD [facility] 08/11/2020, 2:13 PM

## 2020-08-11 NOTE — Progress Notes (Signed)
Barry Contreras. 11:31 AM  Subjective: Patient seen and examined and hospital computer chart reviewed and case discussed with my partner Dr. Michail Sermon and patient has occasional right-sided pain but his jaundice did go away for a while and he has no other complaints  Objective: Vital signs stable afebrile exam please see preassessment evaluation labs reviewed  Assessment: Increased liver test in patient with metal stent for CBD obstruction from pancreatic cancer  Plan: Okay to proceed with repeat ERCP with anesthesia assistance and that procedure was discussed with the patient as well  [facility]  office [phone] After 5PM or if no answer call [phone]

## 2020-08-11 NOTE — Progress Notes (Addendum)
Progress Note  Patient Name: Barry Contreras. Date of Encounter: 08/11/2020  [facility] Cardiologist: Elouise Munroe, MD   Subjective   No acute overnight events. Patient reports feeling very tired this morning and also notes right upper quadrant pain. No chest pain. He thinks breathing is at baseline. Heart rates much better today in the 80s.  He is going for ERCP later today.  Inpatient Medications    Scheduled Meds:  Chlorhexidine Gluconate Cloth  6 each Topical Daily   diltiazem  240 mg Oral Daily   mouth rinse  15 mL Mouth Rinse BID   melatonin  3 mg Oral Once   metoprolol tartrate  50 mg Oral BID   metroNIDAZOLE  500 mg Oral Q12H   Continuous Infusions:   ceFAZolin (ANCEF) IV Stopped (08/11/20 [id number])   diltiazem (CARDIZEM) infusion 7.5 mg/hr (08/11/20 0700)   PRN Meds: acetaminophen, hydrALAZINE, labetalol, metoprolol tartrate, ondansetron (ZOFRAN) IV, traMADol, traZODone   Vital Signs    Vitals:   08/11/20 0200 08/11/20 0300 08/11/20 0325 08/11/20 0600  BP: (!) 153/94 (!) 141/94  (!) 156/93  Pulse: 85 98 (!) 102 99  Resp: 19 20 (!) 23 (!) 23  Temp:   98.1 F (36.7 C)   TempSrc:   Oral   SpO2: 95% 94% 93% 94%  Weight:      Height:        Intake/Output Summary (Last 24 hours) at 08/11/2020 0727 Last data filed at 08/11/2020 0700 Gross per 24 hour  Intake 633.17 ml  Output 750 ml  Net -116.83 ml   Last 3 Weights 08/04/2020 07/24/2020 07/13/2020  Weight (lbs) 162 lb 162 lb 3.2 oz 161 lb 6.4 oz  Weight (kg) 73.483 kg 73.573 kg 73.211 kg      Telemetry    Atrial fibrillation with rates in the 80s to 90s. - Personally Reviewed  ECG    No new ECG tracing today. - Personally Reviewed  Physical Exam   GEN: No acute distress.   Neck: No JVD. Cardiac: Irregularly irregular rhythm with normal rate. Soft II/VI sytolic murmur. No rubs or gallops.  Respiratory: No increased work of breathing. Clear to auscultation bilaterally. GI: Soft and  non-distended. Bowel sounds present. MS: Trace lower extremity edema bilaterally.  No deformity. Skin: Warm and dry. Neuro:  No focal deficits. Psych: Normal affect. Responds appropriately.  Labs    High Sensitivity Troponin:   Recent Labs  Lab 08/07/20 2045 08/07/20 2214  TROPONINIHS 12 12      Chemistry Recent Labs  Lab 08/09/20 0247 08/10/20 0251 08/11/20 0405  NA 137 135 134*  K 3.6 3.2* 3.5  CL 100 99 105  CO2 '24 23 22  '$ GLUCOSE 130* 130* 118*  BUN '15 14 13  '$ CREATININE 0.57* 0.51* 0.46*  CALCIUM 8.6* 8.6* 8.6*  PROT 6.2* 6.1* 5.9*  ALBUMIN 3.1* 2.9* 2.9*  AST 127* 102* 85*  ALT 124* 117* 98*  ALKPHOS 549* 564* 602*  BILITOT 5.7* 6.5* 8.3*  GFRNONAA >60 >60 >60  ANIONGAP '13 13 7     '$ Hematology Recent Labs  Lab 08/07/20 0458 08/08/20 0257 08/09/20 0247  WBC 12.4* 13.9* 10.8*  RBC 3.43* 3.94* 3.89*  HGB 10.5* 11.8* 11.6*  HCT 32.3* 35.7* 35.6*  MCV 94.2 90.6 91.5  MCH 30.6 29.9 29.8  MCHC 32.5 33.1 32.6  RDW 14.4 14.5 14.6  PLT 260 332 318    BNP Recent Labs  Lab 08/07/20 2045  BNP 371.1*  DDimer No results for input(s): DDIMER in the last 168 hours.   Radiology    No results found.  Cardiac Studies   Echocardiogram 08/07/2020: Impressions: 1. Left ventricular ejection fraction, by estimation, is 70 to 75%. The  left ventricle has hyperdynamic function. The left ventricle has no  regional wall motion abnormalities. Left ventricular diastolic function  could not be evaluated.   2. Right ventricular systolic function is normal. The right ventricular  size is normal. There is mildly elevated pulmonary artery systolic  pressure.   3. Left atrial size was mildly dilated.   4. The mitral valve is normal in structure. Mild mitral valve  regurgitation. No evidence of mitral stenosis.   5. The aortic valve is tricuspid. There is mild calcification of the  aortic valve. There is mild thickening of the aortic valve. Aortic valve   regurgitation is mild to moderate. Mild to moderate aortic valve  sclerosis/calcification is present, without  any evidence of aortic stenosis.   6. There is mild dilatation of the ascending aorta, measuring 39 mm.   7. The inferior vena cava is normal in size with greater than 50%  respiratory variability, suggesting right atrial pressure of 3 mmHg.   Comparison(s): Prior images reviewed side by side. Degenerative aortic valve changes and the degree of aortic insufficiency is slightly worse. The ascending aorta is not seen as clearly on the current study and was previously measured as larger.  Patient Profile     87 y.o. male with a history of permanent atrial fibrillation on Eliquis, ascending thoracic aortic aneurysm followed by Dr. [name], hypertension, pancreatic cancer with obstructive juandice s/p biliary stent. Admitted on 08/04/2020 with sepsis secondary to intrabdominal infection. Cardiology consulted on 08/08/2020 for evaluation of atrial fibrillation with RVR at the request of Dr. [name].  Assessment & Plan    Permanent Atrial Fibrillation - Rates more elevated today mostly in the 120s. - Potassium 3.2 today. Will supplement. - Magnesium 1.9. - TSH normal. - Echo showed LVEF of 70-75% with normal wall motion. - Currently on IV Diltiazem. Patient is scheduled for ERCP today and plan is to continue this through the periprocedural period. - Continue PO Diltiazem '240mg'$  daily. - Continue Lopressor '50mg'$  twice daily. - Patient has been on IV Heparin in anticipation of ERCP today. Restart Eliquis when OK GI.   Acute Diastolic CHF - Patient developed acute shortness of breath on 08/07/2020. Occurred after receiving IV fluids in setting of sepsis. - BNP 371. - Chest x-ray 8/3 showed cardiomegaly with bilateral interstitial prominence and small bilateral pleural effusions slightly progressed from prior exam. - Has received multiple dose of IV Lasix. Last dose on 8/3. Net negative 1.86  L since admission.  Renal function stable.  - Appears euvolemic on exam. No additional Lasix needed. - Continue to monitor volume status closely.   Ascending Thoracic Aorta Aneurysm - Stable at 4.2cm on last chest CTA in 07/2019. - Not clearly seen on Echo this admission but noted to be 3.9 cm. - Follow-up with Dr. [name] as outpatient.   Hypertension - BP still elevated. Systolic BP ranging from the 140s to 150s. - Restart IV Diltiazem as above. - Continue PO Diltiazem '240mg'$  daily and Lopressor '50mg'$  twice daily.  - Consider restarting Benazepril-HCTZ 20-12.'5mg'$  daily once IV Diltiazem is stopped. Will defer to primary team.   Otherwise, per primary team: - Sepsis secondary to intrabdominal infection and Klebsiella bacteremia - Metastatic pancreatic cancer with mets to the liver s/p  biliary stent in 06/2020 - Elevated LFTs - Metabolic acidosis - Normocytic anemia - Hypophosphatemia   For questions or updates, please contact [place] Please consult [email or web address] for contact info under        Signed, Darreld Mclean, PA-C  08/11/2020, 7:27 AM    Patient was not seen, chart reviewed.  Patient is currently in endoscopy undergoing ERCP.  Rate control is excellent on IV diltiazem, would continue this through tomorrow if there is no hypotension.  After ERCP and disposition determined, will adjust oral rate control agents.  Cardiology will follow over the weekend.  Elouise Munroe, MD 08/11/20 12:11 PM

## 2020-08-12 DIAGNOSIS — I4821 Permanent atrial fibrillation: Secondary | ICD-10-CM | POA: Diagnosis not present

## 2020-08-12 LAB — COMPREHENSIVE METABOLIC PANEL
ALT: 78 U/L — ABNORMAL HIGH (ref 0–44)
AST: 94 U/L — ABNORMAL HIGH (ref 15–41)
Albumin: 2.8 g/dL — ABNORMAL LOW (ref 3.5–5.0)
Alkaline Phosphatase: 623 U/L — ABNORMAL HIGH (ref 38–126)
Anion gap: 8 (ref 5–15)
BUN: 16 mg/dL (ref 8–23)
CO2: 22 mmol/L (ref 22–32)
Calcium: 8.5 mg/dL — ABNORMAL LOW (ref 8.9–10.3)
Chloride: 99 mmol/L (ref 98–111)
Creatinine, Ser: 0.55 mg/dL — ABNORMAL LOW (ref 0.61–1.24)
GFR, Estimated: >60 mL/min (ref >60)
Glucose, Bld: 193 mg/dL — ABNORMAL HIGH (ref 70–99)
Potassium: 3.9 mmol/L (ref 3.5–5.1)
Sodium: 129 mmol/L — ABNORMAL LOW (ref 135–145)
Total Bilirubin: 8.5 mg/dL — ABNORMAL HIGH (ref 0.3–1.2)
Total Protein: 6.1 g/dL — ABNORMAL LOW (ref 6.5–8.1)

## 2020-08-12 LAB — HEPARIN LEVEL (UNFRACTIONATED)
Heparin Unfractionated: 0.46 IU/mL (ref 0.30–0.70)
Heparin Unfractionated: 0.49 IU/mL (ref 0.30–0.70)

## 2020-08-12 LAB — APTT: aPTT: 77 seconds — ABNORMAL HIGH (ref 24–36)

## 2020-08-12 LAB — MAGNESIUM: Magnesium: 2.1 mg/dL (ref 1.7–2.4)

## 2020-08-12 MED ORDER — ALUM & MAG HYDROXIDE-SIMETH 200-200-20 MG/5ML PO SUSP
30.0000 mL | ORAL | Status: DC | PRN
  Administered 2020-08-12: 30 mL via ORAL
  Filled 2020-08-12: qty 30

## 2020-08-12 MED ORDER — SODIUM CHLORIDE 0.9 % IV SOLN: INTRAVENOUS | Status: DC

## 2020-08-12 NOTE — Progress Notes (Signed)
ANTICOAGULATION CONSULT - Brief note  Pharmacy Consult for Heparin Indication: atrial fibrillation  No Known Allergies  Patient Measurements: Height: '5\' 2"'$  (157.5 cm) Weight: 73.5 kg (162 lb) IBW/kg (Calculated) : 54.6 Heparin Dosing Weight: 69.8 kg  Medications:  Infusions:    ceFAZolin (ANCEF) IV Stopped (08/11/20 2143)   diltiazem (CARDIZEM) infusion 5 mg/hr (08/11/20 1600)   heparin 1,250 Units/hr (08/11/20 1600)   lactated ringers 10 mL/hr at 08/11/20 1125    Assessment: 39 yoM on chronic apixaban for Afib.  Pharmacy has been consulted to dose Heparin around ERCP planned for 8/5.  08/12/2020: AM Initial APTT after restart 77 sec, therapeutic on heparin 1250 units/hr Heparin level 0.46- therapeutic & now correlating to aptt level so will use heparin levels to monitor going forward No bleeding or complications reported by RN.   Goal of Therapy:  Heparin level 0.3-0.7 APTT 66-102 seconds Monitor platelets by anticoagulation protocol: Yes   Plan:  Continue IV heparin 1250 units/hr  Recheck heparin level in 8h to confirm therapeutic Daily heparin level and CBC while on heparin Follow up LFTs, ability to resume apixaban  Netta Cedars, PharmD, BCPS Pharmacy: [phone] 08/12/2020 2:26 AM

## 2020-08-12 NOTE — Progress Notes (Signed)
ANTICOAGULATION CONSULT - Follow Up Note  Pharmacy Consult for Heparin Indication: atrial fibrillation  No Known Allergies  Patient Measurements: Height: '5\' 2"'$  (157.5 cm) Weight: 73.5 kg (162 lb) IBW/kg (Calculated) : 54.6 Heparin Dosing Weight: 69.8 kg  Medications:  Infusions:    ceFAZolin (ANCEF) IV Stopped (08/12/20 0658)   diltiazem (CARDIZEM) infusion 5 mg/hr (08/12/20 0303)   heparin 1,250 Units/hr (08/12/20 0303)   lactated ringers 10 mL/hr at 08/11/20 1125    Assessment: 65 yoM on chronic apixaban for Afib.  Pharmacy has been consulted to dose Heparin around ERCP planned for 8/5. Heparin off at [facility] for ERCP 8/5, resumed after procedure at 15:20  Today, 08/12/2020 Heparin level 0.49 (therapeutic) on 1250 units/hr CBC pending No bleeding reported  Goal of Therapy:  Heparin level 0.3-0.7 Monitor platelets by anticoagulation protocol: Yes   Plan:  Continue heparin 1250 units/hr  Daily heparin level and CBC Follow up ability to resume apixaban  Peggyann Juba, PharmD, BCPS Pharmacy: [phone] 08/12/2020 8:36 AM

## 2020-08-12 NOTE — Progress Notes (Signed)
Progress Note  Patient Name: Barry Contreras. Date of Encounter: 08/12/2020  [facility] Cardiologist: Elouise Munroe, MD   Subjective   No chest pain but tired today after yesterday's ERCP  Inpatient Medications    Scheduled Meds:  Chlorhexidine Gluconate Cloth  6 each Topical Daily   diltiazem  240 mg Oral Daily   mouth rinse  15 mL Mouth Rinse BID   metoprolol tartrate  50 mg Oral BID   metroNIDAZOLE  500 mg Oral Q12H   Continuous Infusions:   ceFAZolin (ANCEF) IV Stopped (08/12/20 [id number])   diltiazem (CARDIZEM) infusion 5 mg/hr (08/12/20 0900)   heparin 1,250 Units/hr (08/12/20 1015)   lactated ringers 10 mL/hr at 08/11/20 1125   PRN Meds: acetaminophen, alum & mag hydroxide-simeth, hydrALAZINE, labetalol, metoprolol tartrate, ondansetron (ZOFRAN) IV, traMADol, traZODone   Vital Signs    Vitals:   08/12/20 0800 08/12/20 0848 08/12/20 0900 08/12/20 1000  BP: 135/80  (!) 146/86 132/83  Pulse: 89  95 87  Resp: (!) '22  17 20  '$ Temp:  98.5 F (36.9 C)    TempSrc:  Oral    SpO2: 95%  94% 94%  Weight:      Height:        Intake/Output Summary (Last 24 hours) at 08/12/2020 1059 Last data filed at 08/12/2020 1030 Gross per 24 hour  Intake 1442.62 ml  Output 550 ml  Net 892.62 ml   Last 3 Weights 08/04/2020 07/24/2020 07/13/2020  Weight (lbs) 162 lb 162 lb 3.2 oz 161 lb 6.4 oz  Weight (kg) 73.483 kg 73.573 kg 73.211 kg      Telemetry    Atrial fibrillation with rates in the 80s to 90s. - Personally Reviewed  ECG    No new ECG tracing today. - Personally Reviewed  Physical Exam   GEN: No acute distress.   Neck: No JVD Cardiac: RRR, soft systolic murmur Respiratory: Clear to auscultation bilaterally. GI: Soft, nontender, non-distended  MS: No edema; No deformity. Neuro:  Nonfocal  Psych: Normal affect  Skin: jaundice.   Labs    High Sensitivity Troponin:   Recent Labs  Lab 08/07/20 2045 08/07/20 2214  TROPONINIHS 12 12       Chemistry Recent Labs  Lab 08/10/20 0251 08/11/20 0405 08/12/20 0311  NA 135 134* 129*  K 3.2* 3.5 3.9  CL 99 105 99  CO2 '23 22 22  '$ GLUCOSE 130* 118* 193*  BUN '14 13 16  '$ CREATININE 0.51* 0.46* 0.55*  CALCIUM 8.6* 8.6* 8.5*  PROT 6.1* 5.9* 6.1*  ALBUMIN 2.9* 2.9* 2.8*  AST 102* 85* 94*  ALT 117* 98* 78*  ALKPHOS 564* 602* 623*  BILITOT 6.5* 8.3* 8.5*  GFRNONAA >60 >60 >60  ANIONGAP '13 7 8     '$ Hematology Recent Labs  Lab 08/07/20 0458 08/08/20 0257 08/09/20 0247  WBC 12.4* 13.9* 10.8*  RBC 3.43* 3.94* 3.89*  HGB 10.5* 11.8* 11.6*  HCT 32.3* 35.7* 35.6*  MCV 94.2 90.6 91.5  MCH 30.6 29.9 29.8  MCHC 32.5 33.1 32.6  RDW 14.4 14.5 14.6  PLT 260 332 318    BNP Recent Labs  Lab 08/07/20 2045  BNP 371.1*     DDimer No results for input(s): DDIMER in the last 168 hours.   Radiology    DG ERCP BILIARY & PANCREATIC DUCTS  Result Date: 08/11/2020 CLINICAL DATA:  Biliary obstruction. EXAM: ERCP TECHNIQUE: Multiple spot images obtained with the fluoroscopic device and submitted for interpretation post-procedure.  FLUOROSCOPY TIME:  Fluoroscopy Time:  3 minutes and 20 seconds. Radiation Exposure Index (if provided by the fluoroscopic device): Not reported Number of Acquired Spot Images: 5 COMPARISON:  CT Abdomen Pelvis, 08/04/2020.  ERCP, 07/04/2020 FINDINGS: Pre-existing metallic biliary stent. Multiple, limited oblique views of the right upper quadrant demonstrating endoscopy common bile duct cannulation and retrograde cholangiogram. No discrete filling defect demonstrated. IMPRESSION: Fluoroscopic imaging during ERCP and retrograde cholangiogram, as above. Please see dictated report for intraprocedural findings. These images were submitted for radiologic interpretation only. Please see the procedural report for the amount of contrast and the fluoroscopy dose. Electronically Signed   By: Michaelle Birks MD   On: 08/11/2020 13:10    Cardiac Studies   Echocardiogram  08/07/2020: Impressions: 1. Left ventricular ejection fraction, by estimation, is 70 to 75%. The  left ventricle has hyperdynamic function. The left ventricle has no  regional wall motion abnormalities. Left ventricular diastolic function  could not be evaluated.   2. Right ventricular systolic function is normal. The right ventricular  size is normal. There is mildly elevated pulmonary artery systolic  pressure.   3. Left atrial size was mildly dilated.   4. The mitral valve is normal in structure. Mild mitral valve  regurgitation. No evidence of mitral stenosis.   5. The aortic valve is tricuspid. There is mild calcification of the  aortic valve. There is mild thickening of the aortic valve. Aortic valve  regurgitation is mild to moderate. Mild to moderate aortic valve  sclerosis/calcification is present, without  any evidence of aortic stenosis.   6. There is mild dilatation of the ascending aorta, measuring 39 mm.   7. The inferior vena cava is normal in size with greater than 50%  respiratory variability, suggesting right atrial pressure of 3 mmHg.   Comparison(s): Prior images reviewed side by side. Degenerative aortic valve changes and the degree of aortic insufficiency is slightly worse. The ascending aorta is not seen as clearly on the current study and was previously measured as larger.  Patient Profile     87 y.o. male with a history of permanent atrial fibrillation on Eliquis, ascending thoracic aortic aneurysm followed by Dr. [name], hypertension, pancreatic cancer with obstructive juandice s/p biliary stent. Admitted on 08/04/2020 with sepsis secondary to intrabdominal infection. Cardiology consulted on 08/08/2020 for evaluation of atrial fibrillation with RVR at the request of Dr. [name].  Assessment & Plan    Permanent Atrial Fibrillation - Echo showed LVEF of 70-75% with normal wall motion. - Currently on IV Diltiazem. May need repeat ERCP due to continued elevation of  LFTs, will continue IV diltiazem through periprocedural period. - Continue PO Diltiazem '240mg'$  daily. - Continue Lopressor '50mg'$  twice daily. - Patient has been on IV Heparin for AC. Restart Eliquis when OK with GI.   Acute Diastolic CHF - Appears euvolemic on exam. No additional Lasix needed. - Continue to monitor volume status closely.   Ascending Thoracic Aorta Aneurysm - Stable at 4.2cm on last chest CTA in 07/2019. - Follow-up as outpatient.   Hypertension - IV Diltiazem as above. - Continue PO Diltiazem '240mg'$  daily and Lopressor '50mg'$  twice daily.  - Consider restarting Benazepril-HCTZ 20-12.'5mg'$  daily prior to discharge after reviewing BP trend. BP ok today.   Otherwise, per primary team: - Sepsis secondary to intrabdominal infection and Klebsiella bacteremia - Metastatic pancreatic cancer with mets to the liver s/p biliary stent in 06/2020 - Elevated LFTs - Metabolic acidosis - Normocytic anemia - Hypophosphatemia  For questions or updates, please contact [place] Please consult [email or web address] for contact info under        Signed, Elouise Munroe, MD  08/12/2020, 10:59 AM

## 2020-08-12 NOTE — Progress Notes (Signed)
Progress Note    Barry Contreras.   GQQ:[id number]  DOB: 02/13/1931  DOA: 08/04/2020     7  PCP: Barry Manes, MD  CC: N/V, fever, chills  Hospital Course: Barry Contreras is an 87 year old Caucasian male with PMH pancreatic cancer, history of obstructive jaundice status post biliary stent placement 07/04/20, permanent atrial fibrillation on Eliquis, hypertension, prediabetes as well as other comorbidities who presented with a chief complaint of fever, nausea, vomiting, chills, and rigors.   In the ED is found to be tachycardic and tachypneic with a lactic acid of 3.4 and abnormal LFTs.  He underwent a CT of the abdomen and pelvis which showed pneumobilia with slight gallbladder wall enhancement and wall thickening.  A dedicated right upper quadrant ultrasound showed gallbladder wall thickening with no visible stone.   Case was discussed with GI.  Patient was started on antibiotics.  Blood cultures ultimately speciated out Klebsiella. Patient also developed A. fib with RVR during hospitalization.  Cardiology was consulted and he ultimately required initiation of a Cardizem drip. Due to uptrending LFTs, he underwent ERCP on 08/11/20 as well (see below).   Interval History:  No events overnight.  Lethargic but awake and alert this morning.  Reviewed reasoning for why he remains in the hospital as well as what we are still doing in terms of watching liver function testing and still needing antibiotics and repeat cultures. Also called and spoke with his daughter Barry Contreras this afternoon and gave her a full update and answered questions.   ROS: Constitutional: negative for chills and fevers, Respiratory: negative for cough and sputum, Cardiovascular: negative for chest pain, and Gastrointestinal: positive for abdominal pain  Assessment & Plan: * Sepsis (HCC)-resolved as of 08/09/2020 - Patient met sepsis criteria on admission; febrile, tachycardic and had a leukocytosis; source considered  bacteremia likely translocation from GI source - see bacteremia   Bacteremia due to Gram-negative bacteria - 4/4 bottles with klebsiella noted on 7/29 cultures - transitioned to Rocephin; goal is 10 days treatment (currently D9); patient will need negative repeat culture after abx prior to chemo port placement as well - repeat blood cultures on 8/8  Elevated LFTs - LFT pattern consistent with obstructive pattern as seen on ERCP 8/5 with sludge partially occluding biliary stent - TB up to 8.3 on 8/5 prior to ERCP - TB still up (8.5) on 8/6 post ERCP; either lag in decline vs ongoing obstruction - per report, sludge partially occluding stent which was removed; tumor ingrowth noted as well and may still require stent within stent - due to findings on ERCP; continue heparin drip until LFTs/TB show persistent downtrend in case needs repeat procedure  Cancer of head of pancreas ([place]) - stage IV; mets to liver; diagnosed on EUS 07/04/20 s/p biopsy with adenocarcinoma - now s/p CBD stent on 6/28 - tentative plan is patient wishes for chemo for prolonging life some but also wishes to have a quality of life - currently prognosis seems guarded/poor  Acute diastolic CHF (congestive heart failure) ([place]) - elevated BNP; CXR shows vascular congestion - etiology possibly from afib with RVR - cardiology following and patient on lasix per cardiology - continue I&O - continue cardizem  - s/p echo: EF 70-75%, unable to grade diastology 2/2 afib  Permanent atrial fibrillation [facility]) - cardiology following as well - has been on cardizem drip and PO - remaining on drip until procedures are concluded (still possibility of repeat ERCP) - continue holding eliquis; continue heparin drip; see  LFTs for reasoning   Acute respiratory failure with hypoxia (HCC) - continue O2, wean as able   Normocytic anemia -Likely dilutional drop -iron 17, TIBC of 241, saturation ratios of 7%, ferritin level 333, folate  level 14.1, and vitamin B12 level 1477 - CBC daily   Hypophosphatemia - Replete and recheck as needed  Hypokalemia - Replete and recheck as needed  Hypertension - continue cardizem and lopressor - BP low/normal (better some today), but will continue to hold benazepril-hctz until off cardizem drip   Old records reviewed in assessment of this patient  Antimicrobials: Cefepime 08/04/2020 >> 08/05/2020 Augmentin 08/07/2020 >> 08/08/2020 Rocephin 08/05/2020 >> 08/09/20 Ancef 08/09/20 >> current   DVT prophylaxis:    Code Status:   Code Status: Full Code Family Communication:   Disposition Plan: Status is: Inpatient  Remains inpatient appropriate because:IV treatments appropriate due to intensity of illness or inability to take PO and Inpatient level of care appropriate due to severity of illness  Dispo: The patient is from: Home              Anticipated d/c is to: Home              Patient currently is not medically stable to d/c.   Difficult to place patient No  Risk of unplanned readmission score: Unplanned Admission- Pilot do not use: 22.8   Objective: Blood pressure 125/78, pulse 77, temperature 97.6 F (36.4 C), temperature source Axillary, resp. rate (!) 22, height '5\' 2"'  (1.575 m), weight 73.5 kg, SpO2 96 %.  Examination: General appearance: alert, cooperative, fatigued, and no distress Head: Normocephalic, without obvious abnormality, atraumatic Eyes:  EOMI Lungs: clear to auscultation bilaterally Heart: regular rate and rhythm and S1, S2 normal Abdomen:  worst TTP in right quadrants; BS present  Extremities:  trace LE edema Skin: mobility and turgor normal Neurologic: Grossly normal  Consultants:  Oncology Cardiology GI  Procedures:  ERCP, 08/11/20  Data Reviewed: I have personally reviewed following labs and imaging studies Results for orders placed or performed during the hospital encounter of 08/04/20 (from the past 24 hour(s))  APTT     Status: Abnormal    Collection Time: 08/11/20 11:36 PM  Result Value Ref Range   aPTT 77 (H) 24 - 36 seconds  Heparin level (unfractionated)     Status: None   Collection Time: 08/11/20 11:36 PM  Result Value Ref Range   Heparin Unfractionated 0.46 0.30 - 0.70 IU/mL  Magnesium     Status: None   Collection Time: 08/12/20  3:11 AM  Result Value Ref Range   Magnesium 2.1 1.7 - 2.4 mg/dL  Comprehensive metabolic panel     Status: Abnormal   Collection Time: 08/12/20  3:11 AM  Result Value Ref Range   Sodium 129 (L) 135 - 145 mmol/L   Potassium 3.9 3.5 - 5.1 mmol/L   Chloride 99 98 - 111 mmol/L   CO2 22 22 - 32 mmol/L   Glucose, Bld 193 (H) 70 - 99 mg/dL   BUN 16 8 - 23 mg/dL   Creatinine, Ser 0.55 (L) 0.61 - 1.24 mg/dL   Calcium 8.5 (L) 8.9 - 10.3 mg/dL   Total Protein 6.1 (L) 6.5 - 8.1 g/dL   Albumin 2.8 (L) 3.5 - 5.0 g/dL   AST 94 (H) 15 - 41 U/L   ALT 78 (H) 0 - 44 U/L   Alkaline Phosphatase 623 (H) 38 - 126 U/L   Total Bilirubin 8.5 (H) 0.3 - 1.2  mg/dL   GFR, Estimated >60 >60 mL/min   Anion gap 8 5 - 15  Heparin level (unfractionated)     Status: None   Collection Time: 08/12/20  7:45 AM  Result Value Ref Range   Heparin Unfractionated 0.49 0.30 - 0.70 IU/mL    Recent Results (from the past 240 hour(s))  Blood Culture (routine x 2)     Status: Abnormal   Collection Time: 08/04/20  7:18 PM   Specimen: BLOOD  Result Value Ref Range Status   Specimen Description   Final    BLOOD RIGHT ANTECUBITAL Performed at [facility] [address], [place], [place] [address]    Special Requests   Final    BOTTLES DRAWN AEROBIC AND ANAEROBIC Blood Culture adequate volume Performed at [facility] [address] [place], [place] [address]    Culture  Setup Time   Final    GRAM NEGATIVE RODS IN BOTH AEROBIC AND ANAEROBIC BOTTLES CRITICAL VALUE NOTED.  VALUE IS CONSISTENT WITH PREVIOUSLY REPORTED AND CALLED VALUE.    Culture (A)  Final    KLEBSIELLA  PNEUMONIAE SUSCEPTIBILITIES PERFORMED ON PREVIOUS CULTURE WITHIN THE LAST 5 DAYS. Performed at [facility], [place] [address], [place], [place] [address]    Report Status 08/07/2020 FINAL  Final  Blood Culture (routine x 2)     Status: Abnormal   Collection Time: 08/04/20  7:25 PM   Specimen: BLOOD  Result Value Ref Range Status   Specimen Description   Final    BLOOD WRIST LEFT Performed at [facility] [address] [place], [place] [address]    Special Requests   Final    BOTTLES DRAWN AEROBIC AND ANAEROBIC Blood Culture adequate volume Performed at [facility] [address], [place], [place] [address]    Culture  Setup Time   Final    GRAM NEGATIVE RODS IN BOTH AEROBIC AND ANAEROBIC BOTTLES CRITICAL RESULT CALLED TO, READ BACK BY AND VERIFIED WITH: CHRISTINE SHADE PHARMD '@[id number]'  08/05/20 EB Performed at [facility], [place] [address] [place], [place] [address]    Culture KLEBSIELLA PNEUMONIAE (A)  Final   Report Status 08/07/2020 FINAL  Final   Organism ID, Bacteria KLEBSIELLA PNEUMONIAE  Final      Susceptibility   Klebsiella pneumoniae - MIC*    AMPICILLIN RESISTANT Resistant     CEFAZOLIN <=4 SENSITIVE Sensitive     CEFEPIME <=0.12 SENSITIVE Sensitive     CEFTAZIDIME <=1 SENSITIVE Sensitive     CEFTRIAXONE <=0.25 SENSITIVE Sensitive     CIPROFLOXACIN <=0.25 SENSITIVE Sensitive     GENTAMICIN <=1 SENSITIVE Sensitive     IMIPENEM <=0.25 SENSITIVE Sensitive     TRIMETH/SULFA <=20 SENSITIVE Sensitive     AMPICILLIN/SULBACTAM 4 SENSITIVE Sensitive     PIP/TAZO <=4 SENSITIVE Sensitive     * KLEBSIELLA PNEUMONIAE  Blood Culture ID Panel (Reflexed)     Status: Abnormal   Collection Time: 08/04/20  7:25 PM  Result Value Ref Range Status   Enterococcus faecalis NOT DETECTED NOT DETECTED Final   Enterococcus Faecium NOT DETECTED NOT DETECTED Final   Listeria monocytogenes NOT DETECTED NOT DETECTED Final   Staphylococcus  species NOT DETECTED NOT DETECTED Final   Staphylococcus aureus (BCID) NOT DETECTED NOT DETECTED Final   Staphylococcus epidermidis NOT DETECTED NOT DETECTED Final   Staphylococcus lugdunensis NOT DETECTED NOT DETECTED Final   Streptococcus species NOT DETECTED NOT DETECTED Final  Streptococcus agalactiae NOT DETECTED NOT DETECTED Final   Streptococcus pneumoniae NOT DETECTED NOT DETECTED Final   Streptococcus pyogenes NOT DETECTED NOT DETECTED Final   A.calcoaceticus-baumannii NOT DETECTED NOT DETECTED Final   Bacteroides fragilis NOT DETECTED NOT DETECTED Final   Enterobacterales DETECTED (A) NOT DETECTED Final    Comment: Enterobacterales represent a large order of gram negative bacteria, not a single organism. CRITICAL RESULT CALLED TO, READ BACK BY AND VERIFIED WITH: [place] '@1249'  08/05/20 EB    Enterobacter cloacae complex NOT DETECTED NOT DETECTED Final   Escherichia coli NOT DETECTED NOT DETECTED Final   Klebsiella aerogenes NOT DETECTED NOT DETECTED Final   Klebsiella oxytoca NOT DETECTED NOT DETECTED Final   Klebsiella pneumoniae DETECTED (A) NOT DETECTED Final    Comment: CRITICAL RESULT CALLED TO, READ BACK BY AND VERIFIED WITH: CHRISTINE SHADE PHARMD '@1249'  08/05/20 EB    Proteus species NOT DETECTED NOT DETECTED Final   Salmonella species NOT DETECTED NOT DETECTED Final   Serratia marcescens NOT DETECTED NOT DETECTED Final   Haemophilus influenzae NOT DETECTED NOT DETECTED Final   Neisseria meningitidis NOT DETECTED NOT DETECTED Final   Pseudomonas aeruginosa NOT DETECTED NOT DETECTED Final   Stenotrophomonas maltophilia NOT DETECTED NOT DETECTED Final   Candida albicans NOT DETECTED NOT DETECTED Final   Candida auris NOT DETECTED NOT DETECTED Final   Candida glabrata NOT DETECTED NOT DETECTED Final   Candida krusei NOT DETECTED NOT DETECTED Final   Candida parapsilosis NOT DETECTED NOT DETECTED Final   Candida tropicalis NOT DETECTED NOT DETECTED Final    Cryptococcus neoformans/gattii NOT DETECTED NOT DETECTED Final   CTX-M ESBL NOT DETECTED NOT DETECTED Final   Carbapenem resistance IMP NOT DETECTED NOT DETECTED Final   Carbapenem resistance KPC NOT DETECTED NOT DETECTED Final   Carbapenem resistance NDM NOT DETECTED NOT DETECTED Final   Carbapenem resist OXA 48 LIKE NOT DETECTED NOT DETECTED Final   Carbapenem resistance VIM NOT DETECTED NOT DETECTED Final    Comment: Performed at [facility], [address] [place], [place] [address]  Resp Panel by RT-PCR (Flu A&B, Covid) Nasopharyngeal Swab     Status: None   Collection Time: 08/04/20  7:45 PM   Specimen: Nasopharyngeal Swab; Nasopharyngeal(NP) swabs in vial transport medium  Result Value Ref Range Status   SARS Coronavirus 2 by RT PCR NEGATIVE NEGATIVE Final    Comment: (NOTE) SARS-CoV-2 target nucleic acids are NOT DETECTED.  The SARS-CoV-2 RNA is generally detectable in upper respiratory specimens during the acute phase of infection. The lowest concentration of SARS-CoV-2 viral copies this assay can detect is 138 copies/mL. A negative result does not preclude SARS-Cov-2 infection and should not be used as the sole basis for treatment or other patient management decisions. A negative result may occur with  improper specimen collection/handling, submission of specimen other than nasopharyngeal swab, presence of viral mutation(s) within the areas targeted by this assay, and inadequate number of viral copies(<138 copies/mL). A negative result must be combined with clinical observations, patient history, and epidemiological information. The expected result is Negative.  Fact Sheet for Patients:  EntrepreneurPulse.com.au  Fact Sheet for Healthcare Providers:  IncredibleEmployment.be  This test is no t yet approved or cleared by the [place] FDA and  has been authorized for detection and/or diagnosis of SARS-CoV-2 by FDA under an  Emergency Use Authorization (EUA). This EUA will remain  in effect (meaning this test can be used) for the duration of the COVID-19 declaration under  Section 564(b)(1) of the Act, 21 U.S.C.section 360bbb-3(b)(1), unless the authorization is terminated  or revoked sooner.       Influenza A by PCR NEGATIVE NEGATIVE Final   Influenza B by PCR NEGATIVE NEGATIVE Final    Comment: (NOTE) The Xpert Xpress SARS-CoV-2/FLU/RSV plus assay is intended as an aid in the diagnosis of influenza from Nasopharyngeal swab specimens and should not be used as a sole basis for treatment. Nasal washings and aspirates are unacceptable for Xpert Xpress SARS-CoV-2/FLU/RSV testing.  Fact Sheet for Patients: EntrepreneurPulse.com.au  Fact Sheet for Healthcare Providers: IncredibleEmployment.be  This test is not yet approved or cleared by the [place] FDA and has been authorized for detection and/or diagnosis of SARS-CoV-2 by FDA under an Emergency Use Authorization (EUA). This EUA will remain in effect (meaning this test can be used) for the duration of the COVID-19 declaration under Section 564(b)(1) of the Act, 21 U.S.C. section 360bbb-3(b)(1), unless the authorization is terminated or revoked.  Performed at [facility], [place] [address], [place], [place] [address]   C Difficile Quick Screen w PCR reflex     Status: None   Collection Time: 08/06/20  1:41 PM   Specimen: STOOL  Result Value Ref Range Status   C Diff antigen NEGATIVE NEGATIVE Final   C Diff toxin NEGATIVE NEGATIVE Final   C Diff interpretation NEGATIVE  Final    Comment: Performed at [facility], [place] [address], [place], [place] [address]  MRSA Next Gen by PCR, Nasal     Status: None   Collection Time: 08/07/20  9:00 PM   Specimen: Nasal Mucosa; Nasal Swab  Result Value Ref Range Status   MRSA by PCR Next Gen NOT DETECTED NOT DETECTED Final    Comment:  (NOTE) The GeneXpert MRSA Assay (FDA approved for NASAL specimens only), is one component of a comprehensive MRSA colonization surveillance program. It is not intended to diagnose MRSA infection nor to guide or monitor treatment for MRSA infections. Test performance is not FDA approved in patients less than 49 years old. Performed at [facility], [place] [address], [place], [place] [address]      Radiology Studies: DG ERCP BILIARY & PANCREATIC DUCTS  Result Date: 08/11/2020 CLINICAL DATA:  Biliary obstruction. EXAM: ERCP TECHNIQUE: Multiple spot images obtained with the fluoroscopic device and submitted for interpretation post-procedure. FLUOROSCOPY TIME:  Fluoroscopy Time:  3 minutes and 20 seconds. Radiation Exposure Index (if provided by the fluoroscopic device): Not reported Number of Acquired Spot Images: 5 COMPARISON:  CT Abdomen Pelvis, 08/04/2020.  ERCP, 07/04/2020 FINDINGS: Pre-existing metallic biliary stent. Multiple, limited oblique views of the right upper quadrant demonstrating endoscopy common bile duct cannulation and retrograde cholangiogram. No discrete filling defect demonstrated. IMPRESSION: Fluoroscopic imaging during ERCP and retrograde cholangiogram, as above. Please see dictated report for intraprocedural findings. These images were submitted for radiologic interpretation only. Please see the procedural report for the amount of contrast and the fluoroscopy dose. Electronically Signed   By: Michaelle Birks MD   On: 08/11/2020 13:10   DG ERCP BILIARY & PANCREATIC DUCTS  Final Result    DG CHEST PORT 1 VIEW  Final Result    DG CHEST PORT 1 VIEW  Final Result    DG Chest Port 1 View  Final Result    US Abdomen Limited RUQ (LIVER/GB)  Final Result    CT ABDOMEN PELVIS W CONTRAST  Final Result    DG Chest Portable 1 View  Final Result      Scheduled Meds:  Chlorhexidine Gluconate Cloth  6 each Topical Daily   diltiazem  240 mg Oral Daily   mouth  rinse  15 mL Mouth Rinse BID   metoprolol tartrate  50 mg Oral BID   metroNIDAZOLE  500 mg Oral Q12H   PRN Meds: acetaminophen, alum & mag hydroxide-simeth, hydrALAZINE, labetalol, metoprolol tartrate, ondansetron (ZOFRAN) IV, traMADol, traZODone Continuous Infusions:   ceFAZolin (ANCEF) IV Stopped (08/12/20 0658)   diltiazem (CARDIZEM) infusion 5 mg/hr (08/12/20 1200)   heparin 1,250 Units/hr (08/12/20 1200)   lactated ringers 10 mL/hr at 08/11/20 1125     LOS: 7 days  Time spent: Greater than 50% of the 35 minute visit was spent in counseling/coordination of care for the patient as laid out in the A&P.   Dwyane Dee, MD [facility] 08/12/2020, 1:19 PM

## 2020-08-12 NOTE — Progress Notes (Signed)
Spoke to [employer], the nurse receiving the patient to room 1429 for handoff. Patient was given 2200 medications, and was transported with cardizem gtt, and heparin gtt infusing per order. Patient VSS prior to transport, condom cath emptied, belongings collected. Sylvain RN transported the patient via bed. Tele was notified of transfer, transfer uneventful.

## 2020-08-12 NOTE — Progress Notes (Signed)
I was informed by lab tech that while drawing patients AM labs he told her he felt like he had been neglected throughout the night and that he was covered in stool. I had just previously been in the patients room 30 minutes earlier and was asked about getting him something for his indigestion, which I had to have ordered as he did not have an active order. I asked patient earlier in the evening if he wanted a bath prior to going to sleep (he had refused during the day), and he had told me that he wanted to wait until morning, but only if he was awake. CNA rounded soon after and he had also told her he had used the bathroom in his bed. She checked his linen and could not find signs of this but we were going to go ahead and do the bath and linen change he had refused earlier. While she was gathering linens the patient fell back asleep and because he had told me prior not to wake him for a bath if he was sleeping I instructed her to let him. I immediately went into the room after speaking to the lab tech and asked the patient if he needed his bed changed due to soiling and he told me he did not soil the bed. I tried to gently remind him of the events throughout the night and he told me not to argue with him. I took care of this patient during his last admit and he did have confusion at night. Will continue to assess for any change in status.

## 2020-08-12 NOTE — Progress Notes (Signed)
[facility] Gastroenterology Progress Note  Paola Burkins. 87 y.o. [birth year]  CC: Obstructive jaundice   Subjective: Patient seen and examined at bedside.  Somewhat frustrated as his LFTs are not improving.  No other acute issues noted.  ROS : Afebrile, negative for chest pain   Objective: Vital signs in last 24 hours: Vitals:   08/12/20 0848 08/12/20 0900  BP:  (!) 146/86  Pulse:  95  Resp:  17  Temp: 98.5 F (36.9 C)   SpO2:  94%    Physical Exam:  General:  Alert, cooperative, no distress, appears stated age  Head:  Normocephalic, without obvious abnormality, atraumatic  Eyes:  , Scleral icterus  Lungs:   No visible respiratory distress  Heart:  Irregular rate and rhythm  Abdomen:   Soft, non-tender, bowel sounds active all four quadrants,  no masses,   Neuro Alert and oriented times 3  Psych Mood and affect normal    Lab Results: Recent Labs    08/11/20 0405 08/12/20 0311  NA 134* 129*  K 3.5 3.9  CL 105 99  CO2 22 22  GLUCOSE 118* 193*  BUN 13 16  CREATININE 0.46* 0.55*  CALCIUM 8.6* 8.5*  MG 2.0 2.1   Recent Labs    08/11/20 0405 08/12/20 0311  AST 85* 94*  ALT 98* 78*  ALKPHOS 602* 623*  BILITOT 8.3* 8.5*  PROT 5.9* 6.1*  ALBUMIN 2.9* 2.8*   No results for input(s): WBC, NEUTROABS, HGB, HCT, MCV, PLT in the last 72 hours. No results for input(s): LABPROT, INR in the last 72 hours.    Assessment/Plan: -Metastatic pancreatic cancer with obstructive jaundice.  Underwent initial ERCP on July 04, 2020 with 10 French by 6 cm uncovered stent placement.  His bilirubin was down to 1.3 as of August 07, 2020 but now having worsening jaundice.    Underwent repeat ERCP yesterday which showed possible tumor growth around the stent as well as minimal ingrowth of the tumor inside the stent.  Balloon pull-through with 9 to 10 mm balloon was performed  History of atrial fibrillation.  On anticoagulation  Recommendation ---------------------------- -No  improvement in bilirubin today. -Recommend to recheck LFTs tomorrow -Advance diet to soft -If no improvement in bilirubin, he may need another ERCP with another stent placement inside the previously placed stent. -GI will follow   Otis Brace MD, [place] 08/12/2020, 9:43 AM  Contact #  [phone]

## 2020-08-13 DIAGNOSIS — D72829 Elevated white blood cell count, unspecified: Secondary | ICD-10-CM

## 2020-08-13 DIAGNOSIS — A4159 Other Gram-negative sepsis: Secondary | ICD-10-CM | POA: Diagnosis not present

## 2020-08-13 LAB — CBC WITH DIFFERENTIAL/PLATELET
Abs Immature Granulocytes: 0.16 10*3/uL — ABNORMAL HIGH (ref 0.00–0.07)
Basophils Absolute: 0 10*3/uL (ref 0.0–0.1)
Basophils Relative: 0 %
Eosinophils Absolute: 0 10*3/uL (ref 0.0–0.5)
Eosinophils Relative: 0 %
HCT: 32.9 % — ABNORMAL LOW (ref 39.0–52.0)
Hemoglobin: 10.7 g/dL — ABNORMAL LOW (ref 13.0–17.0)
Immature Granulocytes: 1 %
Lymphocytes Relative: 7 %
Lymphs Abs: 1.4 10*3/uL (ref 0.7–4.0)
MCH: 29.6 pg (ref 26.0–34.0)
MCHC: 32.5 g/dL (ref 30.0–36.0)
MCV: 90.9 fL (ref 80.0–100.0)
Monocytes Absolute: 1.4 10*3/uL — ABNORMAL HIGH (ref 0.1–1.0)
Monocytes Relative: 7 %
Neutro Abs: 17.4 10*3/uL — ABNORMAL HIGH (ref 1.7–7.7)
Neutrophils Relative %: 85 %
Platelets: 336 10*3/uL (ref 150–400)
RBC: 3.62 MIL/uL — ABNORMAL LOW (ref 4.22–5.81)
RDW: 15.3 % (ref 11.5–15.5)
WBC: 20.4 10*3/uL — ABNORMAL HIGH (ref 4.0–10.5)
nRBC: 0 % (ref 0.0–0.2)

## 2020-08-13 LAB — COMPREHENSIVE METABOLIC PANEL
ALT: 83 U/L — ABNORMAL HIGH (ref 0–44)
AST: 121 U/L — ABNORMAL HIGH (ref 15–41)
Albumin: 2.6 g/dL — ABNORMAL LOW (ref 3.5–5.0)
Alkaline Phosphatase: 590 U/L — ABNORMAL HIGH (ref 38–126)
Anion gap: 6 (ref 5–15)
BUN: 20 mg/dL (ref 8–23)
CO2: 21 mmol/L — ABNORMAL LOW (ref 22–32)
Calcium: 8.2 mg/dL — ABNORMAL LOW (ref 8.9–10.3)
Chloride: 101 mmol/L (ref 98–111)
Creatinine, Ser: 0.44 mg/dL — ABNORMAL LOW (ref 0.61–1.24)
GFR, Estimated: >60 mL/min (ref >60)
Glucose, Bld: 143 mg/dL — ABNORMAL HIGH (ref 70–99)
Potassium: 3.9 mmol/L (ref 3.5–5.1)
Sodium: 128 mmol/L — ABNORMAL LOW (ref 135–145)
Total Bilirubin: 9.9 mg/dL — ABNORMAL HIGH (ref 0.3–1.2)
Total Protein: 5.9 g/dL — ABNORMAL LOW (ref 6.5–8.1)

## 2020-08-13 LAB — HEPARIN LEVEL (UNFRACTIONATED)
Heparin Unfractionated: 0.23 IU/mL — ABNORMAL LOW (ref 0.30–0.70)
Heparin Unfractionated: 0.25 IU/mL — ABNORMAL LOW (ref 0.30–0.70)
Heparin Unfractionated: 0.44 IU/mL (ref 0.30–0.70)

## 2020-08-13 LAB — MAGNESIUM: Magnesium: 2.1 mg/dL (ref 1.7–2.4)

## 2020-08-13 MED ORDER — SODIUM CHLORIDE 0.9 % IV SOLN: INTRAVENOUS | Status: DC

## 2020-08-13 NOTE — Progress Notes (Signed)
ANTICOAGULATION CONSULT NOTE   Pharmacy Consult for heparin Indication: atrial fibrillation  No Known Allergies  Patient Measurements: Height: '5\' 2"'$  (157.5 cm) Weight: 73.5 kg (162 lb) IBW/kg (Calculated) : 54.6 Heparin Dosing Weight: 73 kg  Vital Signs: Temp: 99.1 F (37.3 C) (08/07 2044) Temp Source: Oral (08/07 2044) BP: 153/93 (08/07 2100) Pulse Rate: 96 (08/07 2100)  Labs: Recent Labs    08/11/20 0405 08/11/20 2336 08/12/20 0311 08/12/20 0745 08/13/20 0304 08/13/20 1132 08/13/20 2049  HGB  --   --   --   --  10.7*  --   --   HCT  --   --   --   --  32.9*  --   --   PLT  --   --   --   --  336  --   --   APTT 65* 77*  --   --   --   --   --   HEPARINUNFRC 0.75* 0.46  --    < > 0.23* 0.25* 0.44  CREATININE 0.46*  --  0.55*  --  0.44*  --   --    < > = values in this interval not displayed.     Estimated Creatinine Clearance: 58.3 mL/min (A) (by C-G formula based on SCr of 0.44 mg/dL (L)).   Medications:  - on Eliquis '5mg'$  bid PTA  Assessment: Patient is an 87 y.o M with hx pancreatic cancer with obstructive juandice (s/p bilary stent placement on 07/04/20) and afib on Eliquis PTA presented to the ED on 7/29 with fever and generalized weakness.  He was found to have elevated LFTs  with signs of cholangitis and underwent ERCP on 8/5 (noted to have one partially occluded stent).  He's currently on heparin drip while Eliquis is on hold.  Today, 08/13/2020: - heparin level remains subtherapeutic at 0.25 drip infusing at 1400 units/hr - plan for repeat ERCP on 8/8. GI team recom to hold heparin drip at 0600 on 8/8 in anticipation for procedure at 1130. - hgb 10.7, plts ok - no bleeding documented  PM Update: - heparin level therapeutic = 0.44 with heparin gtt @ 1600 units/hr - no complications of therapy noted  Goal of Therapy:  Heparin level 0.3-0.7 units/ml Monitor platelets by anticoagulation protocol: Yes   Plan:  -  continue heparin @ 1600 units/hr -  Daily heparin level/CBC - Turn heparin drip off at 0600 on 8/8 - monitor for s/sx bleeding  Eduin Friedel, Toribio [place], PharmD 08/13/2020,11:43 PM

## 2020-08-13 NOTE — Plan of Care (Signed)

## 2020-08-13 NOTE — Progress Notes (Signed)
[facility] Gastroenterology Progress Note  Barry Contreras. 87 y.o. 22-Nov-1931  CC: Obstructive jaundice   Subjective: Patient seen and examined at bedside.   No acute issues noted.  ROS : Afebrile, negative for chest pain   Objective: Vital signs in last 24 hours: Vitals:   08/12/20 2306 08/13/20 0500  BP: 121/86 104/76  Pulse: 95 87  Resp: 18   Temp: 99.1 F (37.3 C) 98.5 F (36.9 C)  SpO2: 95% 98%    Physical Exam:  General:  Alert, cooperative, no distress, appears stated age  Head:  Normocephalic, without obvious abnormality, atraumatic  Eyes:  , Scleral icterus  Lungs:   No visible respiratory distress  Heart:  Irregular rate and rhythm  Abdomen:   Soft, non-tender, bowel sounds active all four quadrants,  no masses,   Neuro Alert and oriented times 3  Psych Mood and affect normal    Lab Results: Recent Labs    08/12/20 0311 08/13/20 0304  NA 129* 128*  K 3.9 3.9  CL 99 101  CO2 22 21*  GLUCOSE 193* 143*  BUN 16 20  CREATININE 0.55* 0.44*  CALCIUM 8.5* 8.2*  MG 2.1 2.1   Recent Labs    08/12/20 0311 08/13/20 0304  AST 94* 121*  ALT 78* 83*  ALKPHOS 623* 590*  BILITOT 8.5* 9.9*  PROT 6.1* 5.9*  ALBUMIN 2.8* 2.6*   Recent Labs    08/13/20 0304  WBC 20.4*  NEUTROABS 17.4*  HGB 10.7*  HCT 32.9*  MCV 90.9  PLT 336   No results for input(s): LABPROT, INR in the last 72 hours.    Assessment/Plan: -Metastatic pancreatic cancer with obstructive jaundice.  Underwent initial ERCP on July 04, 2020 with 10 French by 6 cm uncovered stent placement.  His bilirubin was down to 1.3 as of August 07, 2020 but now having worsening jaundice.    Underwent repeat ERCP 08/05 which showed possible tumor growth around the stent as well as minimal ingrowth of the tumor inside the stent.  Balloon pull-through with 9 to 10 mm balloon was performed  History of atrial fibrillation.  On anticoagulation  Recommendation ---------------------------- - Total  bilirubin trending up.  9.9 today. -Tentative plan for ERCP with stent in stent placement tomorrow at 1130 with Dr. Watt Climes.  Hold heparin drip from 6 AM in the morning. -Keep n.p.o. past midnight -Patient also has worsening leukocytosis.  He is currently on cefazolin and Flagyl.  If ongoing leukocytosis, recommend changing antibiotics to Zosyn. -Repeat labs in the morning -GI will follow  Risks (post ERCP pancreatitis, bleeding, infection, bowel perforation that could require surgery, sedation-related changes in cardiopulmonary systems), benefits (identification and possible treatment of source of symptoms, exclusion of certain causes of symptoms), and alternatives (watchful waiting, radiographic imaging studies, empiric medical treatment)  were explained to patient/family in detail and patient wishes to proceed.    Otis Brace MD, FACP 08/13/2020, 9:29 AM  Contact #  [phone]

## 2020-08-13 NOTE — Progress Notes (Signed)
Progress Note  Patient Name: Barry Contreras. Date of Encounter: 08/13/2020  [facility] Cardiologist: Barry Munroe, MD   Subjective   Appears more fatigued over serial days. We discussed ambulation as tolerated.   Inpatient Medications    Scheduled Meds:  Chlorhexidine Gluconate Cloth  6 each Topical Daily   diltiazem  240 mg Oral Daily   mouth rinse  15 mL Mouth Rinse BID   metoprolol tartrate  50 mg Oral BID   metroNIDAZOLE  500 mg Oral Q12H   Continuous Infusions:  sodium chloride      ceFAZolin (ANCEF) IV 2 g (08/13/20 0503)   diltiazem (CARDIZEM) infusion 5 mg/hr (08/12/20 2322)   heparin 1,400 Units/hr (08/13/20 0504)   lactated ringers 10 mL/hr at 08/11/20 1125   PRN Meds: acetaminophen, alum & mag hydroxide-simeth, hydrALAZINE, labetalol, metoprolol tartrate, ondansetron (ZOFRAN) IV, traMADol, traZODone   Vital Signs    Vitals:   08/12/20 2000 08/12/20 2208 08/12/20 2306 08/13/20 0500  BP: (!) 161/86 (!) 148/90 121/86 104/76  Pulse: 91 97 95 87  Resp: (!) 24  18   Temp:  97.7 F (36.5 C) 99.1 F (37.3 C) 98.5 F (36.9 C)  TempSrc:  Oral Oral Oral  SpO2: 94% 95% 95% 98%  Weight:      Height:        Intake/Output Summary (Last 24 hours) at 08/13/2020 0958 Last data filed at 08/13/2020 0900 Gross per 24 hour  Intake 905.72 ml  Output 500 ml  Net 405.72 ml   Last 3 Weights 08/04/2020 07/24/2020 07/13/2020  Weight (lbs) 162 lb 162 lb 3.2 oz 161 lb 6.4 oz  Weight (kg) 73.483 kg 73.573 kg 73.211 kg      Telemetry    Atrial fibrillation with rates in 90s - 100s  - Personally Reviewed  ECG    No new ECG tracing today. - Personally Reviewed  Physical Exam   GEN: No acute distress.   Neck: No JVD Cardiac: RRR, soft systolic murmur Respiratory: Clear to auscultation bilaterally. GI: Soft, nontender, non-distended  MS: No edema; No deformity. Neuro:  Nonfocal  Psych: Normal affect  Skin: jaundice.  Labs    High Sensitivity Troponin:    Recent Labs  Lab 08/07/20 2045 08/07/20 2214  TROPONINIHS 12 12      Chemistry Recent Labs  Lab 08/11/20 0405 08/12/20 0311 08/13/20 0304  NA 134* 129* 128*  K 3.5 3.9 3.9  CL 105 99 101  CO2 22 22 21*  GLUCOSE 118* 193* 143*  BUN '13 16 20  '$ CREATININE 0.46* 0.55* 0.44*  CALCIUM 8.6* 8.5* 8.2*  PROT 5.9* 6.1* 5.9*  ALBUMIN 2.9* 2.8* 2.6*  AST 85* 94* 121*  ALT 98* 78* 83*  ALKPHOS 602* 623* 590*  BILITOT 8.3* 8.5* 9.9*  GFRNONAA >60 >60 >60  ANIONGAP '7 8 6     '$ Hematology Recent Labs  Lab 08/08/20 0257 08/09/20 0247 08/13/20 0304  WBC 13.9* 10.8* 20.4*  RBC 3.94* 3.89* 3.62*  HGB 11.8* 11.6* 10.7*  HCT 35.7* 35.6* 32.9*  MCV 90.6 91.5 90.9  MCH 29.9 29.8 29.6  MCHC 33.1 32.6 32.5  RDW 14.5 14.6 15.3  PLT 332 318 336    BNP Recent Labs  Lab 08/07/20 2045  BNP 371.1*     DDimer No results for input(s): DDIMER in the last 168 hours.   Radiology    DG ERCP BILIARY & PANCREATIC DUCTS  Result Date: 08/11/2020 CLINICAL DATA:  Biliary obstruction.  EXAM: ERCP TECHNIQUE: Multiple spot images obtained with the fluoroscopic device and submitted for interpretation post-procedure. FLUOROSCOPY TIME:  Fluoroscopy Time:  3 minutes and 20 seconds. Radiation Exposure Index (if provided by the fluoroscopic device): Not reported Number of Acquired Spot Images: 5 COMPARISON:  CT Abdomen Pelvis, 08/04/2020.  ERCP, 07/04/2020 FINDINGS: Pre-existing metallic biliary stent. Multiple, limited oblique views of the right upper quadrant demonstrating endoscopy common bile duct cannulation and retrograde cholangiogram. No discrete filling defect demonstrated. IMPRESSION: Fluoroscopic imaging during ERCP and retrograde cholangiogram, as above. Please see dictated report for intraprocedural findings. These images were submitted for radiologic interpretation only. Please see the procedural report for the amount of contrast and the fluoroscopy dose. Electronically Signed   By: Barry Birks  MD   On: 08/11/2020 13:10    Cardiac Studies   Echocardiogram 08/07/2020: Impressions: 1. Left ventricular ejection fraction, by estimation, is 70 to 75%. The  left ventricle has hyperdynamic function. The left ventricle has no  regional wall motion abnormalities. Left ventricular diastolic function  could not be evaluated.   2. Right ventricular systolic function is normal. The right ventricular  size is normal. There is mildly elevated pulmonary artery systolic  pressure.   3. Left atrial size was mildly dilated.   4. The mitral valve is normal in structure. Mild mitral valve  regurgitation. No evidence of mitral stenosis.   5. The aortic valve is tricuspid. There is mild calcification of the  aortic valve. There is mild thickening of the aortic valve. Aortic valve  regurgitation is mild to moderate. Mild to moderate aortic valve  sclerosis/calcification is present, without  any evidence of aortic stenosis.   6. There is mild dilatation of the ascending aorta, measuring 39 mm.   7. The inferior vena cava is normal in size with greater than 50%  respiratory variability, suggesting right atrial pressure of 3 mmHg.   Comparison(s): Prior images reviewed side by side. Degenerative aortic valve changes and the degree of aortic insufficiency is slightly worse. The ascending aorta is not seen as clearly on the current study and was previously measured as larger.  Patient Profile     87 y.o. male with a history of permanent atrial fibrillation on Eliquis, ascending thoracic aortic aneurysm followed by Dr. Prescott Contreras, hypertension, pancreatic cancer with obstructive juandice s/p biliary stent. Admitted on 08/04/2020 with sepsis secondary to intrabdominal infection. Cardiology consulted on 08/08/2020 for evaluation of atrial fibrillation with RVR at the request of Dr. Alfredia Contreras.  Assessment & Plan    Permanent Atrial Fibrillation - Echo showed LVEF of 70-75% with normal wall motion. - Currently on  IV Diltiazem. May need repeat ERCP due to continued elevation of LFTs, will continue IV diltiazem through periprocedural period. Next ERCP planned for tomorrow tentatively.  - Continue PO Diltiazem '240mg'$  daily. - Continue Lopressor '50mg'$  twice daily. - Patient has been on IV Heparin for AC. Restart Eliquis when OK with GI.   Acute Diastolic CHF - Appears euvolemic on exam. No additional Lasix needed. - Continue to monitor volume status closely.   Ascending Thoracic Aorta Aneurysm - Stable at 4.2cm on last chest CTA in 07/2019. - Follow-up as outpatient.   Hypertension - IV Diltiazem as above. - Continue PO Diltiazem '240mg'$  daily and Lopressor '50mg'$  twice daily.  - Consider restarting Benazepril-HCTZ 20-12.'5mg'$  daily prior to discharge after reviewing BP trend. BP ok today.   Otherwise, per primary team: - Sepsis secondary to intrabdominal infection and Klebsiella bacteremia - Metastatic pancreatic cancer  with mets to the liver s/p biliary stent in 06/2020 - Elevated LFTs - Metabolic acidosis - Normocytic anemia - Hypophosphatemia   For questions or updates, please contact [facility] Please consult [email or web address] for contact info under        Signed, Barry Munroe, MD  08/13/2020, 9:58 AM

## 2020-08-13 NOTE — Assessment & Plan Note (Addendum)
-   suspect increased leukocytosis possibly reactive in nature from ERCP on 8/5 (no bandemia appreciated) - still no further concern for new/recurrent infection - d/c abx on 8/9; leukocytosis improving - see bacteremia as well

## 2020-08-13 NOTE — Progress Notes (Signed)
[facility] for Heparin Indication: atrial fibrillation  No Known Allergies  Patient Measurements: Height: '5\' 2"'$  (157.5 cm) Weight: 73.5 kg (162 lb) IBW/kg (Calculated) : 54.6 Heparin Dosing Weight: 69.8 kg  Medications:  Infusions:   sodium chloride      ceFAZolin (ANCEF) IV Stopped (08/12/20 2240)   diltiazem (CARDIZEM) infusion 5 mg/hr (08/12/20 2322)   heparin 1,250 Units/hr (08/12/20 1534)   lactated ringers 10 mL/hr at 08/11/20 1125    Assessment: 88 yoM on chronic apixaban for Afib.  Pharmacy has been consulted to dose Heparin around ERCP planned for 8/5. Heparin off at [facility] for ERCP 8/5, resumed after procedure at 15:20  Today, 08/13/2020 Heparin level 0.23 (subtherapeutic) on 1250 units/hr Hgb 10.7 Pltc 336K No bleeding reported; no interruption of therapy per RN  Goal of Therapy:  Heparin level 0.3-0.7 Monitor platelets by anticoagulation protocol: Yes   Plan:  Increase heparin to 1400 units/hr  Check heparin level 8 hr after rate increase Daily heparin level and CBC Follow up ability to resume apixaban  Leone Haven, PharmD 08/13/2020 4:06 AM

## 2020-08-13 NOTE — Progress Notes (Signed)
ANTICOAGULATION CONSULT NOTE - Follow Up Consult  Pharmacy Consult for heparin Indication: atrial fibrillation  No Known Allergies  Patient Measurements: Height: '5\' 2"'$  (157.5 cm) Weight: 73.5 kg (162 lb) IBW/kg (Calculated) : 54.6 Heparin Dosing Weight: 73 kg  Vital Signs: Temp: 98.5 F (36.9 C) (08/07 0500) Temp Source: Oral (08/07 0500) BP: 104/76 (08/07 0500) Pulse Rate: 87 (08/07 0500)  Labs: Recent Labs    08/10/20 1848 08/11/20 0405 08/11/20 0405 08/11/20 2336 08/12/20 0311 08/12/20 0745 08/13/20 0304  HGB  --   --   --   --   --   --  10.7*  HCT  --   --   --   --   --   --  32.9*  PLT  --   --   --   --   --   --  336  APTT 47* 65*  --  77*  --   --   --   HEPARINUNFRC  --  0.75*   < > 0.46  --  0.49 0.23*  CREATININE  --  0.46*  --   --  0.55*  --  0.44*   < > = values in this interval not displayed.    Estimated Creatinine Clearance: 58.3 mL/min (A) (by C-G formula based on SCr of 0.44 mg/dL (L)).   Medications:  - on Eliquis '5mg'$  bid PTA  Assessment: Patient is an 87 y.o M with hx pancreatic cancer with obstructive juandice (s/p bilary stent placement on 07/04/20) and afib on Eliquis PTA presented to the ED on 7/29 with fever and generalized weakness.  He was found to have elevated LFTs  with signs of cholangitis and underwent ERCP on 8/5 (noted to have one partially occluded stent).  He's currently on heparin drip while Eliquis is on hold.  Today, 08/13/2020: - heparin level remains subtherapeutic at 0.25 drip infusing at 1400 units/hr - plan for repeat ERCP on 8/8. GI team recom to hold heparin drip at 0600 on 8/8 in anticipation for procedure at 1130. - hgb 10.7, plts ok - no bleeding documented   Goal of Therapy:  Heparin level 0.3-0.7 units/ml Monitor platelets by anticoagulation protocol: Yes   Plan:  -  increase heparin to 1600 units/hr - check 8 hr heparin level - Turn heparin drip off at 0600 on 8/8 - monitor for s/sx bleeding  Lauralynn Loeb,  Lalia Loudon P 08/13/2020,10:21 AM

## 2020-08-13 NOTE — Progress Notes (Signed)
Progress Note    Barry Contreras.   PFY:[id number]  DOB: 03/07/1931  DOA: 08/04/2020     8  PCP: Barry Manes, MD  CC: N/V, fever, chills  Hospital Course: [name] is an 87 year old Caucasian male with PMH pancreatic cancer, history of obstructive jaundice status post biliary stent placement 07/04/20, permanent atrial fibrillation on Eliquis, hypertension, prediabetes as well as other comorbidities who presented with a chief complaint of fever, nausea, vomiting, chills, and rigors.   In the ED is found to be tachycardic and tachypneic with a lactic acid of 3.4 and abnormal LFTs.  He underwent a CT of the abdomen and pelvis which showed pneumobilia with slight gallbladder wall enhancement and wall thickening.  A dedicated right upper quadrant ultrasound showed gallbladder wall thickening with no visible stone.   Case was discussed with GI.  Patient was started on antibiotics.  Blood cultures ultimately speciated out Klebsiella. Patient also developed A. fib with RVR during hospitalization.  Cardiology was consulted and he ultimately required initiation of a Cardizem drip. Due to uptrending LFTs, he underwent ERCP on 08/11/20 as well (see below). LFTs continued to trend up post 1st ERCP and plan is for repeat ERCP on 08/14/20.   Interval History:  Daughter present bedside this morning.  Update given with questions answered.  Due to further uptrending of bilirubin, plan is for repeat ERCP tomorrow.  ROS: Constitutional: negative for chills and fevers, Respiratory: negative for cough and sputum, Cardiovascular: negative for chest pain, and Gastrointestinal: positive for abdominal pain  Assessment & Plan: * Sepsis due to Klebsiella (HCC)-resolved as of 08/09/2020 - Patient met sepsis criteria on admission; febrile, tachycardic and had a leukocytosis; source considered bacteremia likely translocation from GI source - see bacteremia   Leukocytosis - suspect increased leukocytosis possibly  reactive in nature from ERCP on 8/5 (no bandemia appreciated); continue ancef and flagyl for now; will continue trending CBC  - if develops fever will broaden - repeat blood culture planned for 8/8  Bacteremia due to Gram-negative bacteria - 4/4 bottles with klebsiella noted on 7/29 cultures - transitioned to Rocephin; goal is 10 days treatment (currently D10, course completed on 8/7 for bacteremia but given plan for repeat ERCP, will keep abx going for now); patient will need negative repeat culture after abx prior to chemo port placement as well - repeat blood cultures on 8/8  Elevated LFTs - LFT pattern consistent with obstructive pattern as seen on ERCP 8/5 with sludge partially occluding biliary stent - TB up to 8.3 on 8/5 prior to ERCP - TB still up (8.5) on 8/6 post ERCP; either lag in decline vs ongoing obstruction - per report, sludge partially occluding stent which was removed; tumor ingrowth noted as well and may still require stent within stent - due to findings on ERCP; continue heparin drip, see afib  Cancer of head of pancreas ([place]) - stage IV; mets to liver; diagnosed on EUS 07/04/20 s/p biopsy with adenocarcinoma - now s/p CBD stent on 6/28 - tentative plan is patient wishes for chemo for prolonging life some but also wishes to have a quality of life - currently prognosis seems guarded/poor  Acute diastolic CHF (congestive heart failure) ([place]) - elevated BNP; CXR shows vascular congestion - etiology possibly from afib with RVR - cardiology following and patient on lasix per cardiology - continue I&O - continue cardizem  - s/p echo: EF 70-75%, unable to grade diastology 2/2 afib  Permanent atrial fibrillation [facility]) - cardiology following  as well - has been on cardizem drip and PO - remaining on drip until procedures are concluded (still possibility of repeat ERCP) - continue holding eliquis; continue heparin drip (hold heparin at 6 am on 08/14/20 in anticipation for  repeat ERCP)  Acute respiratory failure with hypoxia ([place]) - continue O2, wean as able   Normocytic anemia -Likely dilutional drop -iron 17, TIBC of 241, saturation ratios of 7%, ferritin level 333, folate level 14.1, and vitamin B12 level 1477 - CBC daily   Hypophosphatemia - Replete and recheck as needed  Hypokalemia - Replete and recheck as needed  Hypertension - continue cardizem and lopressor - BP low/normal (better some today), but will continue to hold benazepril-hctz until off cardizem drip   Old records reviewed in assessment of this patient  Antimicrobials: Cefepime 08/04/2020 >> 08/05/2020 Augmentin 08/07/2020 >> 08/08/2020 Rocephin 08/05/2020 >> 08/09/20 Ancef 08/09/20 >> current  Flagyl 08/08/20 >> current   DVT prophylaxis: Heparin drip (on eliquis at home)   Code Status:   Code Status: Full Code Family Communication: daughter   Disposition Plan: Status is: Inpatient  Remains inpatient appropriate because:IV treatments appropriate due to intensity of illness or inability to take PO and Inpatient level of care appropriate due to severity of illness  Dispo: The patient is from: Home              Anticipated d/c is to: Home              Patient currently is not medically stable to d/c.   Difficult to place patient No  Risk of unplanned readmission score: Unplanned Admission- Pilot do not use: 25.34   Objective: Blood pressure 104/76, pulse 87, temperature 98.5 F (36.9 C), temperature source Oral, resp. rate 18, height '5\' 2"'  (1.575 m), weight 73.5 kg, SpO2 98 %.  Examination: General appearance: alert, cooperative, fatigued, and no distress Head: Normocephalic, without obvious abnormality, atraumatic Eyes:  EOMI Lungs: clear to auscultation bilaterally Heart: regular rate and rhythm and S1, S2 normal Abdomen:  worst TTP in right quadrants; BS present  Extremities:  trace LE edema Skin: mobility and turgor normal Neurologic: Grossly normal  Consultants:   Oncology Cardiology GI  Procedures:  ERCP, 08/11/20  Data Reviewed: I have personally reviewed following labs and imaging studies Results for orders placed or performed during the hospital encounter of 08/04/20 (from the past 24 hour(s))  Magnesium     Status: None   Collection Time: 08/13/20  3:04 AM  Result Value Ref Range   Magnesium 2.1 1.7 - 2.4 mg/dL  Comprehensive metabolic panel     Status: Abnormal   Collection Time: 08/13/20  3:04 AM  Result Value Ref Range   Sodium 128 (L) 135 - 145 mmol/L   Potassium 3.9 3.5 - 5.1 mmol/L   Chloride 101 98 - 111 mmol/L   CO2 21 (L) 22 - 32 mmol/L   Glucose, Bld 143 (H) 70 - 99 mg/dL   BUN 20 8 - 23 mg/dL   Creatinine, Ser 0.44 (L) 0.61 - 1.24 mg/dL   Calcium 8.2 (L) 8.9 - 10.3 mg/dL   Total Protein 5.9 (L) 6.5 - 8.1 g/dL   Albumin 2.6 (L) 3.5 - 5.0 g/dL   AST 121 (H) 15 - 41 U/L   ALT 83 (H) 0 - 44 U/L   Alkaline Phosphatase 590 (H) 38 - 126 U/L   Total Bilirubin 9.9 (H) 0.3 - 1.2 mg/dL   GFR, Estimated >60 >60 mL/min   Anion  gap 6 5 - 15  Heparin level (unfractionated)     Status: Abnormal   Collection Time: 08/13/20  3:04 AM  Result Value Ref Range   Heparin Unfractionated 0.23 (L) 0.30 - 0.70 IU/mL  CBC with Differential/Platelet     Status: Abnormal   Collection Time: 08/13/20  3:04 AM  Result Value Ref Range   WBC 20.4 (H) 4.0 - 10.5 K/uL   RBC 3.62 (L) 4.22 - 5.81 MIL/uL   Hemoglobin 10.7 (L) 13.0 - 17.0 g/dL   HCT 32.9 (L) 39.0 - 52.0 %   MCV 90.9 80.0 - 100.0 fL   MCH 29.6 26.0 - 34.0 pg   MCHC 32.5 30.0 - 36.0 g/dL   RDW 15.3 11.5 - 15.5 %   Platelets 336 150 - 400 K/uL   nRBC 0.0 0.0 - 0.2 %   Neutrophils Relative % 85 %   Neutro Abs 17.4 (H) 1.7 - 7.7 K/uL   Lymphocytes Relative 7 %   Lymphs Abs 1.4 0.7 - 4.0 K/uL   Monocytes Relative 7 %   Monocytes Absolute 1.4 (H) 0.1 - 1.0 K/uL   Eosinophils Relative 0 %   Eosinophils Absolute 0.0 0.0 - 0.5 K/uL   Basophils Relative 0 %   Basophils Absolute 0.0 0.0 -  0.1 K/uL   Immature Granulocytes 1 %   Abs Immature Granulocytes 0.16 (H) 0.00 - 0.07 K/uL  Heparin level (unfractionated)     Status: Abnormal   Collection Time: 08/13/20 11:32 AM  Result Value Ref Range   Heparin Unfractionated 0.25 (L) 0.30 - 0.70 IU/mL    Recent Results (from the past 240 hour(s))  Blood Culture (routine x 2)     Status: Abnormal   Collection Time: 08/04/20  7:18 PM   Specimen: BLOOD  Result Value Ref Range Status   Specimen Description   Final    BLOOD RIGHT ANTECUBITAL Performed at [facility], [place] [address], [place], [place] [address]    Special Requests   Final    BOTTLES DRAWN AEROBIC AND ANAEROBIC Blood Culture adequate volume Performed at [place] [address] [place], [place] [address]    Culture  Setup Time   Final    GRAM NEGATIVE RODS IN BOTH AEROBIC AND ANAEROBIC BOTTLES CRITICAL VALUE NOTED.  VALUE IS CONSISTENT WITH PREVIOUSLY REPORTED AND CALLED VALUE.    Culture (A)  Final    KLEBSIELLA PNEUMONIAE SUSCEPTIBILITIES PERFORMED ON PREVIOUS CULTURE WITHIN THE LAST 5 DAYS. Performed at [facility], [place] [address] [place], [place] [address]    Report Status 08/07/2020 FINAL  Final  Blood Culture (routine x 2)     Status: Abnormal   Collection Time: 08/04/20  7:25 PM   Specimen: BLOOD  Result Value Ref Range Status   Specimen Description   Final    BLOOD WRIST LEFT Performed at [facility] [address] [place], [place] [address]    Special Requests   Final    BOTTLES DRAWN AEROBIC AND ANAEROBIC Blood Culture adequate volume Performed at [place] [address], [place], [place] [address]    Culture  Setup Time   Final    GRAM NEGATIVE RODS IN BOTH AEROBIC AND ANAEROBIC BOTTLES CRITICAL RESULT CALLED TO, READ BACK BY AND VERIFIED WITH: CHRISTINE SHADE PHARMD '@1249'  08/05/20 EB Performed at [facility], [place] [address],  [place], [place] [address]    Culture KLEBSIELLA PNEUMONIAE (A)  Final   Report Status 08/07/2020 FINAL  Final   Organism ID, Bacteria KLEBSIELLA PNEUMONIAE  Final      Susceptibility   Klebsiella pneumoniae - MIC*    AMPICILLIN RESISTANT Resistant     CEFAZOLIN <=4 SENSITIVE Sensitive     CEFEPIME <=0.12 SENSITIVE Sensitive     CEFTAZIDIME <=1 SENSITIVE Sensitive     CEFTRIAXONE <=0.25 SENSITIVE Sensitive     CIPROFLOXACIN <=0.25 SENSITIVE Sensitive     GENTAMICIN <=1 SENSITIVE Sensitive     IMIPENEM <=0.25 SENSITIVE Sensitive     TRIMETH/SULFA <=20 SENSITIVE Sensitive     AMPICILLIN/SULBACTAM 4 SENSITIVE Sensitive     PIP/TAZO <=4 SENSITIVE Sensitive     * KLEBSIELLA PNEUMONIAE  Blood Culture ID Panel (Reflexed)     Status: Abnormal   Collection Time: 08/04/20  7:25 PM  Result Value Ref Range Status   Enterococcus faecalis NOT DETECTED NOT DETECTED Final   Enterococcus Faecium NOT DETECTED NOT DETECTED Final   Listeria monocytogenes NOT DETECTED NOT DETECTED Final   Staphylococcus species NOT DETECTED NOT DETECTED Final   Staphylococcus aureus (BCID) NOT DETECTED NOT DETECTED Final   Staphylococcus epidermidis NOT DETECTED NOT DETECTED Final   Staphylococcus lugdunensis NOT DETECTED NOT DETECTED Final   Streptococcus species NOT DETECTED NOT DETECTED Final   Streptococcus agalactiae NOT DETECTED NOT DETECTED Final   Streptococcus pneumoniae NOT DETECTED NOT DETECTED Final   Streptococcus pyogenes NOT DETECTED NOT DETECTED Final   A.calcoaceticus-baumannii NOT DETECTED NOT DETECTED Final   Bacteroides fragilis NOT DETECTED NOT DETECTED Final   Enterobacterales DETECTED (A) NOT DETECTED Final    Comment: Enterobacterales represent a large order of gram negative bacteria, not a single organism. CRITICAL RESULT CALLED TO, READ BACK BY AND VERIFIED WITH: [place] '@1249'  08/05/20 EB    Enterobacter cloacae complex NOT DETECTED NOT DETECTED Final   Escherichia coli NOT  DETECTED NOT DETECTED Final   Klebsiella aerogenes NOT DETECTED NOT DETECTED Final   Klebsiella oxytoca NOT DETECTED NOT DETECTED Final   Klebsiella pneumoniae DETECTED (A) NOT DETECTED Final    Comment: CRITICAL RESULT CALLED TO, READ BACK BY AND VERIFIED WITH: CHRISTINE SHADE PHARMD '@1249'  08/05/20 EB    Proteus species NOT DETECTED NOT DETECTED Final   Salmonella species NOT DETECTED NOT DETECTED Final   Serratia marcescens NOT DETECTED NOT DETECTED Final   Haemophilus influenzae NOT DETECTED NOT DETECTED Final   Neisseria meningitidis NOT DETECTED NOT DETECTED Final   Pseudomonas aeruginosa NOT DETECTED NOT DETECTED Final   Stenotrophomonas maltophilia NOT DETECTED NOT DETECTED Final   Candida albicans NOT DETECTED NOT DETECTED Final   Candida auris NOT DETECTED NOT DETECTED Final   Candida glabrata NOT DETECTED NOT DETECTED Final   Candida krusei NOT DETECTED NOT DETECTED Final   Candida parapsilosis NOT DETECTED NOT DETECTED Final   Candida tropicalis NOT DETECTED NOT DETECTED Final   Cryptococcus neoformans/gattii NOT DETECTED NOT DETECTED Final   CTX-M ESBL NOT DETECTED NOT DETECTED Final   Carbapenem resistance IMP NOT DETECTED NOT DETECTED Final   Carbapenem resistance KPC NOT DETECTED NOT DETECTED Final   Carbapenem resistance NDM NOT DETECTED NOT DETECTED Final   Carbapenem resist OXA 48 LIKE NOT DETECTED NOT DETECTED Final   Carbapenem resistance VIM NOT DETECTED NOT DETECTED Final    Comment: Performed at [facility], [address], [place], [place] [address]  Resp Panel by RT-PCR (Flu A&B, Covid) Nasopharyngeal Swab     Status: None   Collection Time:  08/04/20  7:45 PM   Specimen: Nasopharyngeal Swab; Nasopharyngeal(NP) swabs in vial transport medium  Result Value Ref Range Status   SARS Coronavirus 2 by RT PCR NEGATIVE NEGATIVE Final    Comment: (NOTE) SARS-CoV-2 target nucleic acids are NOT DETECTED.  The SARS-CoV-2 RNA is generally detectable in upper  respiratory specimens during the acute phase of infection. The lowest concentration of SARS-CoV-2 viral copies this assay can detect is 138 copies/mL. A negative result does not preclude SARS-Cov-2 infection and should not be used as the sole basis for treatment or other patient management decisions. A negative result may occur with  improper specimen collection/handling, submission of specimen other than nasopharyngeal swab, presence of viral mutation(s) within the areas targeted by this assay, and inadequate number of viral copies(<138 copies/mL). A negative result must be combined with clinical observations, patient history, and epidemiological information. The expected result is Negative.  Fact Sheet for Patients:  EntrepreneurPulse.com.au  Fact Sheet for Healthcare Providers:  IncredibleEmployment.be  This test is no t yet approved or cleared by the [place] FDA and  has been authorized for detection and/or diagnosis of SARS-CoV-2 by FDA under an Emergency Use Authorization (EUA). This EUA will remain  in effect (meaning this test can be used) for the duration of the COVID-19 declaration under Section 564(b)(1) of the Act, 21 U.S.C.section 360bbb-3(b)(1), unless the authorization is terminated  or revoked sooner.       Influenza A by PCR NEGATIVE NEGATIVE Final   Influenza B by PCR NEGATIVE NEGATIVE Final    Comment: (NOTE) The Xpert Xpress SARS-CoV-2/FLU/RSV plus assay is intended as an aid in the diagnosis of influenza from Nasopharyngeal swab specimens and should not be used as a sole basis for treatment. Nasal washings and aspirates are unacceptable for Xpert Xpress SARS-CoV-2/FLU/RSV testing.  Fact Sheet for Patients: EntrepreneurPulse.com.au  Fact Sheet for Healthcare Providers: IncredibleEmployment.be  This test is not yet approved or cleared by the [place] FDA and has been  authorized for detection and/or diagnosis of SARS-CoV-2 by FDA under an Emergency Use Authorization (EUA). This EUA will remain in effect (meaning this test can be used) for the duration of the COVID-19 declaration under Section 564(b)(1) of the Act, 21 U.S.C. section 360bbb-3(b)(1), unless the authorization is terminated or revoked.  Performed at [facility], [place] [address] [place], [place] [address]   C Difficile Quick Screen w PCR reflex     Status: None   Collection Time: 08/06/20  1:41 PM   Specimen: STOOL  Result Value Ref Range Status   C Diff antigen NEGATIVE NEGATIVE Final   C Diff toxin NEGATIVE NEGATIVE Final   C Diff interpretation NEGATIVE  Final    Comment: Performed at [facility], [place] [address] [place], [place] [address]  MRSA Next Gen by PCR, Nasal     Status: None   Collection Time: 08/07/20  9:00 PM   Specimen: Nasal Mucosa; Nasal Swab  Result Value Ref Range Status   MRSA by PCR Next Gen NOT DETECTED NOT DETECTED Final    Comment: (NOTE) The GeneXpert MRSA Assay (FDA approved for NASAL specimens only), is one component of a comprehensive MRSA colonization surveillance program. It is not intended to diagnose MRSA infection nor to guide or monitor treatment for MRSA infections. Test performance is not FDA approved in patients less than 64 years old. Performed at [facility], [place] [address] [place], [place] [address]      Radiology  Studies: No results found. DG ERCP BILIARY & PANCREATIC DUCTS  Final Result    DG CHEST PORT 1 VIEW  Final Result    DG CHEST PORT 1 VIEW  Final Result    DG Chest Port 1 View  Final Result    US Abdomen Limited RUQ (LIVER/GB)  Final Result    CT ABDOMEN PELVIS W CONTRAST  Final Result    DG Chest Portable 1 View  Final Result      Scheduled Meds:  Chlorhexidine Gluconate Cloth  6 each Topical Daily   diltiazem  240 mg Oral Daily   mouth rinse  15  mL Mouth Rinse BID   metoprolol tartrate  50 mg Oral BID   metroNIDAZOLE  500 mg Oral Q12H   PRN Meds: acetaminophen, alum & mag hydroxide-simeth, hydrALAZINE, labetalol, metoprolol tartrate, ondansetron (ZOFRAN) IV, traMADol, traZODone Continuous Infusions:  sodium chloride     sodium chloride      ceFAZolin (ANCEF) IV 2 g (08/13/20 0503)   diltiazem (CARDIZEM) infusion 5 mg/hr (08/12/20 2322)   heparin 1,400 Units/hr (08/13/20 0504)   lactated ringers 10 mL/hr at 08/11/20 1125     LOS: 8 days  Time spent: Greater than 50% of the 35 minute visit was spent in counseling/coordination of care for the patient as laid out in the A&P.   Dwyane Dee, MD [facility] 08/13/2020, 12:47 PM

## 2020-08-14 ENCOUNTER — Encounter (HOSPITAL_COMMUNITY): Payer: Self-pay | Admitting: Internal Medicine

## 2020-08-14 ENCOUNTER — Inpatient Hospital Stay (HOSPITAL_COMMUNITY): Payer: Medicare PPO | Admitting: Anesthesiology

## 2020-08-14 ENCOUNTER — Inpatient Hospital Stay (HOSPITAL_COMMUNITY): Payer: Medicare PPO

## 2020-08-14 ENCOUNTER — Encounter (HOSPITAL_COMMUNITY)
Admission: EM | Disposition: A | Discharge status: Home Health | Payer: Self-pay | Source: Home / Self Care | Attending: Internal Medicine

## 2020-08-14 HISTORY — PX: ERCP: SHX5425

## 2020-08-14 HISTORY — PX: REMOVAL OF STONES: SHX5545

## 2020-08-14 HISTORY — PX: BILIARY STENT PLACEMENT: SHX5538

## 2020-08-14 LAB — CBC WITH DIFFERENTIAL/PLATELET
Abs Immature Granulocytes: 0.2 10*3/uL — ABNORMAL HIGH (ref 0.00–0.07)
Basophils Absolute: 0 10*3/uL (ref 0.0–0.1)
Basophils Relative: 0 %
Eosinophils Absolute: 0 10*3/uL (ref 0.0–0.5)
Eosinophils Relative: 0 %
HCT: 32 % — ABNORMAL LOW (ref 39.0–52.0)
Hemoglobin: 10.9 g/dL — ABNORMAL LOW (ref 13.0–17.0)
Immature Granulocytes: 1 %
Lymphocytes Relative: 8 %
Lymphs Abs: 1.4 10*3/uL (ref 0.7–4.0)
MCH: 30.5 pg (ref 26.0–34.0)
MCHC: 34.1 g/dL (ref 30.0–36.0)
MCV: 89.6 fL (ref 80.0–100.0)
Monocytes Absolute: 1.2 10*3/uL — ABNORMAL HIGH (ref 0.1–1.0)
Monocytes Relative: 6 %
Neutro Abs: 15.3 10*3/uL — ABNORMAL HIGH (ref 1.7–7.7)
Neutrophils Relative %: 85 %
Platelets: 326 10*3/uL (ref 150–400)
RBC: 3.57 MIL/uL — ABNORMAL LOW (ref 4.22–5.81)
RDW: 15.4 % (ref 11.5–15.5)
WBC: 18.1 10*3/uL — ABNORMAL HIGH (ref 4.0–10.5)
nRBC: 0 % (ref 0.0–0.2)

## 2020-08-14 LAB — COMPREHENSIVE METABOLIC PANEL
ALT: 69 U/L — ABNORMAL HIGH (ref 0–44)
AST: 103 U/L — ABNORMAL HIGH (ref 15–41)
Albumin: 2.5 g/dL — ABNORMAL LOW (ref 3.5–5.0)
Alkaline Phosphatase: 605 U/L — ABNORMAL HIGH (ref 38–126)
Anion gap: 9 (ref 5–15)
BUN: 12 mg/dL (ref 8–23)
CO2: 23 mmol/L (ref 22–32)
Calcium: 8.1 mg/dL — ABNORMAL LOW (ref 8.9–10.3)
Chloride: 97 mmol/L — ABNORMAL LOW (ref 98–111)
Creatinine, Ser: 0.46 mg/dL — ABNORMAL LOW (ref 0.61–1.24)
GFR, Estimated: >60 mL/min (ref >60)
Glucose, Bld: 116 mg/dL — ABNORMAL HIGH (ref 70–99)
Potassium: 3.7 mmol/L (ref 3.5–5.1)
Sodium: 129 mmol/L — ABNORMAL LOW (ref 135–145)
Total Bilirubin: 12.7 mg/dL — ABNORMAL HIGH (ref 0.3–1.2)
Total Protein: 6.2 g/dL — ABNORMAL LOW (ref 6.5–8.1)

## 2020-08-14 LAB — MAGNESIUM: Magnesium: 2 mg/dL (ref 1.7–2.4)

## 2020-08-14 LAB — HEPARIN LEVEL (UNFRACTIONATED): Heparin Unfractionated: 0.47 IU/mL (ref 0.30–0.70)

## 2020-08-14 SURGERY — ERCP, WITH INTERVENTION IF INDICATED
Anesthesia: General

## 2020-08-14 MED ORDER — LACTATED RINGERS IV SOLN
INTRAVENOUS | Status: AC | PRN
  Administered 2020-08-14: 10 mL/h via INTRAVENOUS

## 2020-08-14 MED ORDER — FENTANYL CITRATE (PF) 100 MCG/2ML IJ SOLN
INTRAMUSCULAR | Status: AC
  Filled 2020-08-14: qty 2

## 2020-08-14 MED ORDER — SODIUM CHLORIDE 0.9 % IV SOLN: INTRAVENOUS | Status: DC | PRN

## 2020-08-14 MED ORDER — PROPOFOL 10 MG/ML IV BOLUS
INTRAVENOUS | Status: DC | PRN
  Administered 2020-08-14: 80 mg via INTRAVENOUS

## 2020-08-14 MED ORDER — PHENYLEPHRINE HCL (PRESSORS) 10 MG/ML IV SOLN
INTRAVENOUS | Status: AC
  Filled 2020-08-14: qty 2

## 2020-08-14 MED ORDER — FENTANYL CITRATE (PF) 100 MCG/2ML IJ SOLN
INTRAMUSCULAR | Status: DC | PRN
  Administered 2020-08-14: 50 ug via INTRAVENOUS

## 2020-08-14 MED ORDER — SUGAMMADEX SODIUM 200 MG/2ML IV SOLN
INTRAVENOUS | Status: DC | PRN
  Administered 2020-08-14: 200 mg via INTRAVENOUS

## 2020-08-14 MED ORDER — HEPARIN (PORCINE) 25000 UT/250ML-% IV SOLN
1600.0000 [IU]/h | INTRAVENOUS | Status: DC
  Administered 2020-08-14 – 2020-08-15 (×3): 1600 [IU]/h via INTRAVENOUS
  Filled 2020-08-14 (×4): qty 250

## 2020-08-14 MED ORDER — GLUCAGON HCL RDNA (DIAGNOSTIC) 1 MG IJ SOLR
INTRAMUSCULAR | Status: AC
  Filled 2020-08-14: qty 1

## 2020-08-14 MED ORDER — PHENYLEPHRINE 40 MCG/ML (10ML) SYRINGE FOR IV PUSH (FOR BLOOD PRESSURE SUPPORT)
PREFILLED_SYRINGE | INTRAVENOUS | Status: DC | PRN
Start: 2020-08-14 — End: 2020-08-14
  Administered 2020-08-14 (×3): 80 ug via INTRAVENOUS

## 2020-08-14 MED ORDER — INDOMETHACIN 50 MG RE SUPP
RECTAL | Status: AC
  Filled 2020-08-14: qty 2

## 2020-08-14 MED ORDER — PROPOFOL 500 MG/50ML IV EMUL
INTRAVENOUS | Status: AC
  Filled 2020-08-14: qty 50

## 2020-08-14 MED ORDER — PROPOFOL 10 MG/ML IV BOLUS
INTRAVENOUS | Status: AC
  Filled 2020-08-14: qty 20

## 2020-08-14 MED ORDER — LIDOCAINE 2% (20 MG/ML) 5 ML SYRINGE
INTRAMUSCULAR | Status: DC | PRN
Start: 2020-08-14 — End: 2020-08-14
  Administered 2020-08-14: 100 mg via INTRAVENOUS

## 2020-08-14 MED ORDER — ROCURONIUM BROMIDE 10 MG/ML (PF) SYRINGE
PREFILLED_SYRINGE | INTRAVENOUS | Status: DC | PRN
  Administered 2020-08-14: 10 mg via INTRAVENOUS
  Administered 2020-08-14: 60 mg via INTRAVENOUS

## 2020-08-14 MED ORDER — PHENYLEPHRINE HCL-NACL 20-0.9 MG/250ML-% IV SOLN
INTRAVENOUS | Status: DC | PRN
  Administered 2020-08-14: 25 ug/min via INTRAVENOUS

## 2020-08-14 MED ORDER — ONDANSETRON HCL 4 MG/2ML IJ SOLN
INTRAMUSCULAR | Status: DC | PRN
  Administered 2020-08-14: 4 mg via INTRAVENOUS

## 2020-08-14 MED ORDER — DEXAMETHASONE SODIUM PHOSPHATE 10 MG/ML IJ SOLN
INTRAMUSCULAR | Status: DC | PRN
  Administered 2020-08-14: 4 mg via INTRAVENOUS

## 2020-08-14 NOTE — Op Note (Signed)
[facility] Patient Name: Barry Contreras Procedure Date: 08/14/2020 MRN: [id number] Attending MD: Clarene Essex , MD Date of Birth: 1931-07-11 CSN: [id number] Age: 87 Admit Type: Inpatient Procedure:                ERCP Indications:              Jaundice in patient with pancreatic stent not                            improved after cleaning out stent the other day Providers:                Clarene Essex, MD, Edwina Hickman, Kary Kos RN,                            RN, Cletis Athens, Technician, Ladona Ridgel,                            Technician Referring MD:              Medicines:                General Anesthesia Complications:            No immediate complications. Estimated Blood Loss:     Estimated blood loss: none. Procedure:                Pre-Anesthesia Assessment:                           - Prior to the procedure, a History and Physical                            was performed, and patient medications and                            allergies were reviewed. The patient's tolerance of                            previous anesthesia was also reviewed. The risks                            and benefits of the procedure and the sedation                            options and risks were discussed with the patient.                            All questions were answered, and informed consent                            was obtained. Prior Anticoagulants: The patient has                            taken heparin, last dose was day of procedure. ASA  Grade Assessment: III - A patient with severe                            systemic disease. After reviewing the risks and                            benefits, the patient was deemed in satisfactory                            condition to undergo the procedure.                           After obtaining informed consent, the scope was                            passed under direct vision. Throughout the                             procedure, the patient's blood pressure, pulse, and                            oxygen saturations were monitored continuously. The                            [id number]) Olympus duodenoscope was                            introduced through the mouth, and used to inject                            contrast into and used to cannulate the bile duct.                            The ERCP was somewhat difficult due to Difficulty                            in passing the scope. Successful completion of the                            procedure was aided by Change scope to the new                            exalt. The patient tolerated the procedure well. Scope In: Scope Out: Findings:      One uncovered metal stent originating in the common bile duct was       emerging from the major papilla. The stent was partially occluded with       obvious tumor ingrowth. And deep selective cannulation was readily       obtained using the balloon catheter and the wire was advanced into the       intrahepatic and the biliary tree was swept after occlusion       cholangiogram did not reveal any proximal issues with the stent with a 9       mm balloon starting at  the bifurcation. Sludge was swept from the duct.       There was some resistance and on occlusion cholangiogram there seemed to       be a mid duct narrowing and we elected to place one 10 Fr by 6 cm       uncovered metal stent with no external flaps and no internal flaps was       placed 5.5 cm into the common bile duct. Fluid and light pus flowed       through the stent. The stent was in good position with an obvious waist       in the mid duct. There was no pancreatic duct injection or wire       advancement throughout the procedure and the patient tolerated the       procedure well Impression:               - One partially occluded stent from the common bile                            duct was seen in the major  papilla.                           - The biliary tree was swept and sludge was found.                            There was some narrowing in the mid duct                           - One uncovered metal stent was placed into the                            common bile duct. Moderate Sedation:      Not Applicable - Patient had care per Anesthesia. Recommendation:           - Clear liquid diet today. If doing well may have                            soft solids tomorrow                           - Resume Eliquis (apixaban) or heparin at prior                            doses today. As per medical team                           - Return to GI clinic PRN.                           - Telephone GI clinic if symptomatic PRN.                           - Check liver enzymes (AST, ALT, alkaline                            phosphatase, bilirubin) and  CBC tomorrow. Procedure Code(s):        --- Professional ---                           [phone], Endoscopic retrograde                            cholangiopancreatography (ERCP); with removal and                            exchange of stent(s), biliary or pancreatic duct,                            including pre- and post-dilation and guide wire                            passage, when performed, including sphincterotomy,                            when performed, each stent exchanged                           [id number], Endoscopic retrograde                            cholangiopancreatography (ERCP); with removal of                            calculi/debris from biliary/pancreatic duct(s) Diagnosis Code(s):        --- Professional ---                           [id number], Other mechanical complication of bile                            duct prosthesis, initial encounter                           R17, Unspecified jaundice CPT copyright 2019 American Medical Association. All rights reserved. The codes documented in this report are preliminary and upon coder review  may  be revised to meet current compliance requirements. Clarene Essex, MD 08/14/2020 1:57:04 PM This report has been signed electronically. Number of Addenda: 0

## 2020-08-14 NOTE — Progress Notes (Signed)
Progress Note    Barry Contreras.   BZJ:[id number]  DOB: 1931-10-15  DOA: 08/04/2020     9  PCP: Lajean Manes, MD  CC: N/V, fever, chills  Hospital Course: [name] is an 87 year old Caucasian male with PMH pancreatic cancer, history of obstructive jaundice status post biliary stent placement 07/04/20, permanent atrial fibrillation on Eliquis, hypertension, prediabetes as well as other comorbidities who presented with a chief complaint of fever, nausea, vomiting, chills, and rigors.   In the ED is found to be tachycardic and tachypneic with a lactic acid of 3.4 and abnormal LFTs.  He underwent a CT of the abdomen and pelvis which showed pneumobilia with slight gallbladder wall enhancement and wall thickening.  A dedicated right upper quadrant ultrasound showed gallbladder wall thickening with no visible stone.   Case was discussed with GI.  Patient was started on antibiotics.  Blood cultures ultimately speciated out Klebsiella. Patient also developed A. fib with RVR during hospitalization.  Cardiology was consulted and he ultimately required initiation of a Cardizem drip. Due to uptrending LFTs, he underwent ERCP on 08/11/20 as well (see below). LFTs continued to trend up post 1st ERCP and plan is for repeat ERCP on 08/14/20.   Interval History:  No events overnight. Very lethargic in bed but no distress. Still ongoing abd pain/discomfort but otherwise understands plan for today.   ROS: Constitutional: negative for chills and fevers, Respiratory: negative for cough and sputum, Cardiovascular: negative for chest pain, and Gastrointestinal: positive for abdominal pain  Assessment & Plan: * Sepsis due to Klebsiella (HCC)-resolved as of 08/09/2020 - Patient met sepsis criteria on admission; febrile, tachycardic and had a leukocytosis; source considered bacteremia likely translocation from GI source - see bacteremia   Leukocytosis - suspect increased leukocytosis possibly reactive in  nature from ERCP on 8/5 (no bandemia appreciated); continue ancef and flagyl for now; will continue trending CBC  - if develops fever will broaden - follow up repeat blood cultures on 8/8 for bacteremia s/p 10 days treatment  Bacteremia due to Gram-negative bacteria - 4/4 bottles with klebsiella noted on 7/29 cultures - transitioned to Rocephin; goal is 10 days treatment (day 10 was 8/7, course completed on 8/7 for bacteremia but given plan for repeat ERCP, will keep abx going for now) - repeat blood cultures on 8/8 - if blood culture negative at 48 hours likely can d/c abx if okay with GI  Elevated LFTs - LFT pattern consistent with obstructive pattern as seen on ERCP 8/5 with sludge partially occluding biliary stent and tumor ingrowth - TB still uptrending s/p 1st ERCP; up to 12.7 on 8/8 - per report, sludge partially occluding stent which was removed; tumor ingrowth noted as well and may still require stent within stent - follow up ERCP results on 8/8 - trend LFTs   Cancer of head of pancreas ([place]) - stage IV; mets to liver; diagnosed on EUS 07/04/20 s/p biopsy with adenocarcinoma - now s/p CBD stent on 6/28 - tentative plan is patient wishes for chemo for prolonging life some but also wishes to have a quality of life - currently prognosis seems guarded/poor  Acute diastolic CHF (congestive heart failure) ([place]) - elevated BNP; CXR shows vascular congestion - etiology possibly from afib with RVR - cardiology following and patient on lasix per cardiology - continue I&O - continue cardizem  - s/p echo: EF 70-75%, unable to grade diastology 2/2 afib  Permanent atrial fibrillation [facility]) - cardiology following as well - has  been on cardizem drip and PO - remaining on drip until procedures are concluded (still possibility of repeat ERCP) - continue holding eliquis; continue heparin drip until LFTs consistently downtrending  Acute respiratory failure with hypoxia (HCC) - continue O2,  wean as able   Normocytic anemia -Likely dilutional drop -iron 17, TIBC of 241, saturation ratios of 7%, ferritin level 333, folate level 14.1, and vitamin B12 level 1477 - CBC daily   Hypophosphatemia - Replete and recheck as needed  Hypokalemia - Replete and recheck as needed  Hypertension - continue cardizem and lopressor - BP low/normal initially; starting to improve; follow up vitals after ERCP, if BP still okay can likely resume benazepril-hctz today or tomorrow   Old records reviewed in assessment of this patient  Antimicrobials: Cefepime 08/04/2020 >> 08/05/2020 Augmentin 08/07/2020 >> 08/08/2020 Rocephin 08/05/2020 >> 08/09/20 Ancef 08/09/20 >> current  Flagyl 08/08/20 >> current   DVT prophylaxis: Heparin drip (on eliquis at home)   Code Status:   Code Status: Full Code Family Communication: daughter   Disposition Plan: Status is: Inpatient  Remains inpatient appropriate because:IV treatments appropriate due to intensity of illness or inability to take PO and Inpatient level of care appropriate due to severity of illness  Dispo: The patient is from: Home              Anticipated d/c is to: Home              Patient currently is not medically stable to d/c.   Difficult to place patient No  Risk of unplanned readmission score: Unplanned Admission- Pilot do not use: 25.91   Objective: Blood pressure (!) 159/94, pulse (!) 105, temperature 99.1 F (37.3 C), temperature source Oral, resp. rate (!) 21, height _0  (1.575 m), weight 73.5 kg, SpO2 95 %.  Examination: General appearance: alert, cooperative, fatigued, and no distress Head: Normocephalic, without obvious abnormality, atraumatic Eyes:  EOMI Lungs: clear to auscultation bilaterally Heart: regular rate and rhythm and S1, S2 normal Abdomen:  worst TTP in right quadrants; BS present  Extremities:  trace LE edema Skin: mobility and turgor normal Neurologic: Grossly normal  Consultants:   Oncology Cardiology GI  Procedures:  ERCP, 08/11/20  Data Reviewed: I have personally reviewed following labs and imaging studies Results for orders placed or performed during the hospital encounter of 08/04/20 (from the past 24 hour(s))  Heparin level (unfractionated)     Status: None   Collection Time: 08/13/20  8:49 PM  Result Value Ref Range   Heparin Unfractionated 0.44 0.30 - 0.70 IU/mL  Magnesium     Status: None   Collection Time: 08/14/20  4:37 AM  Result Value Ref Range   Magnesium 2.0 1.7 - 2.4 mg/dL  Comprehensive metabolic panel     Status: Abnormal   Collection Time: 08/14/20  4:37 AM  Result Value Ref Range   Sodium 129 (L) 135 - 145 mmol/L   Potassium 3.7 3.5 - 5.1 mmol/L   Chloride 97 (L) 98 - 111 mmol/L   CO2 23 22 - 32 mmol/L   Glucose, Bld 116 (H) 70 - 99 mg/dL   BUN 12 8 - 23 mg/dL   Creatinine, Ser 0.46 (L) 0.61 - 1.24 mg/dL   Calcium 8.1 (L) 8.9 - 10.3 mg/dL   Total Protein 6.2 (L) 6.5 - 8.1 g/dL   Albumin 2.5 (L) 3.5 - 5.0 g/dL   AST 103 (H) 15 - 41 U/L   ALT 69 (H) 0 - 44 U/L  Alkaline Phosphatase 605 (H) 38 - 126 U/L   Total Bilirubin 12.7 (H) 0.3 - 1.2 mg/dL   GFR, Estimated >60 >60 mL/min   Anion gap 9 5 - 15  Heparin level (unfractionated)     Status: None   Collection Time: 08/14/20  4:37 AM  Result Value Ref Range   Heparin Unfractionated 0.47 0.30 - 0.70 IU/mL  CBC with Differential/Platelet     Status: Abnormal   Collection Time: 08/14/20  4:37 AM  Result Value Ref Range   WBC 18.1 (H) 4.0 - 10.5 K/uL   RBC 3.57 (L) 4.22 - 5.81 MIL/uL   Hemoglobin 10.9 (L) 13.0 - 17.0 g/dL   HCT 32.0 (L) 39.0 - 52.0 %   MCV 89.6 80.0 - 100.0 fL   MCH 30.5 26.0 - 34.0 pg   MCHC 34.1 30.0 - 36.0 g/dL   RDW 15.4 11.5 - 15.5 %   Platelets 326 150 - 400 K/uL   nRBC 0.0 0.0 - 0.2 %   Neutrophils Relative % 85 %   Neutro Abs 15.3 (H) 1.7 - 7.7 K/uL   Lymphocytes Relative 8 %   Lymphs Abs 1.4 0.7 - 4.0 K/uL   Monocytes Relative 6 %   Monocytes  Absolute 1.2 (H) 0.1 - 1.0 K/uL   Eosinophils Relative 0 %   Eosinophils Absolute 0.0 0.0 - 0.5 K/uL   Basophils Relative 0 %   Basophils Absolute 0.0 0.0 - 0.1 K/uL   Immature Granulocytes 1 %   Abs Immature Granulocytes 0.20 (H) 0.00 - 0.07 K/uL    Recent Results (from the past 240 hour(s))  Blood Culture (routine x 2)     Status: Abnormal   Collection Time: 08/04/20  7:18 PM   Specimen: BLOOD  Result Value Ref Range Status   Specimen Description   Final    BLOOD RIGHT ANTECUBITAL Performed at [facility], [place] [address], [place], [place] [address]    Special Requests   Final    BOTTLES DRAWN AEROBIC AND ANAEROBIC Blood Culture adequate volume Performed at [place] [address], [place], [place] [address]    Culture  Setup Time   Final    GRAM NEGATIVE RODS IN BOTH AEROBIC AND ANAEROBIC BOTTLES CRITICAL VALUE NOTED.  VALUE IS CONSISTENT WITH PREVIOUSLY REPORTED AND CALLED VALUE.    Culture (A)  Final    KLEBSIELLA PNEUMONIAE SUSCEPTIBILITIES PERFORMED ON PREVIOUS CULTURE WITHIN THE LAST 5 DAYS. Performed at [facility], [place] [address] [place], [place] [address]    Report Status 08/07/2020 FINAL  Final  Blood Culture (routine x 2)     Status: Abnormal   Collection Time: 08/04/20  7:25 PM   Specimen: BLOOD  Result Value Ref Range Status   Specimen Description   Final    BLOOD WRIST LEFT Performed at [place] [address], [place], [place] [address]    Special Requests   Final    BOTTLES DRAWN AEROBIC AND ANAEROBIC Blood Culture adequate volume Performed at [place] [address], [place], [place] [address]    Culture  Setup Time   Final    GRAM NEGATIVE RODS IN BOTH AEROBIC AND ANAEROBIC BOTTLES CRITICAL RESULT CALLED TO, READ BACK BY AND VERIFIED WITH: CHRISTINE SHADE PHARMD _0  08/05/20 EB Performed at [facility], [place] [address], [place], [place]  [address]    Culture KLEBSIELLA PNEUMONIAE (A)  Final   Report Status  08/07/2020 FINAL  Final   Organism ID, Bacteria KLEBSIELLA PNEUMONIAE  Final      Susceptibility   Klebsiella pneumoniae - MIC*    AMPICILLIN RESISTANT Resistant     CEFAZOLIN <=4 SENSITIVE Sensitive     CEFEPIME <=0.12 SENSITIVE Sensitive     CEFTAZIDIME <=1 SENSITIVE Sensitive     CEFTRIAXONE <=0.25 SENSITIVE Sensitive     CIPROFLOXACIN <=0.25 SENSITIVE Sensitive     GENTAMICIN <=1 SENSITIVE Sensitive     IMIPENEM <=0.25 SENSITIVE Sensitive     TRIMETH/SULFA <=20 SENSITIVE Sensitive     AMPICILLIN/SULBACTAM 4 SENSITIVE Sensitive     PIP/TAZO <=4 SENSITIVE Sensitive     * KLEBSIELLA PNEUMONIAE  Blood Culture ID Panel (Reflexed)     Status: Abnormal   Collection Time: 08/04/20  7:25 PM  Result Value Ref Range Status   Enterococcus faecalis NOT DETECTED NOT DETECTED Final   Enterococcus Faecium NOT DETECTED NOT DETECTED Final   Listeria monocytogenes NOT DETECTED NOT DETECTED Final   Staphylococcus species NOT DETECTED NOT DETECTED Final   Staphylococcus aureus (BCID) NOT DETECTED NOT DETECTED Final   Staphylococcus epidermidis NOT DETECTED NOT DETECTED Final   Staphylococcus lugdunensis NOT DETECTED NOT DETECTED Final   Streptococcus species NOT DETECTED NOT DETECTED Final   Streptococcus agalactiae NOT DETECTED NOT DETECTED Final   Streptococcus pneumoniae NOT DETECTED NOT DETECTED Final   Streptococcus pyogenes NOT DETECTED NOT DETECTED Final   A.calcoaceticus-baumannii NOT DETECTED NOT DETECTED Final   Bacteroides fragilis NOT DETECTED NOT DETECTED Final   Enterobacterales DETECTED (A) NOT DETECTED Final    Comment: Enterobacterales represent a large order of gram negative bacteria, not a single organism. CRITICAL RESULT CALLED TO, READ BACK BY AND VERIFIED WITH: [place] _0  08/05/20 EB    Enterobacter cloacae complex NOT DETECTED NOT DETECTED Final   Escherichia coli NOT DETECTED NOT  DETECTED Final   Klebsiella aerogenes NOT DETECTED NOT DETECTED Final   Klebsiella oxytoca NOT DETECTED NOT DETECTED Final   Klebsiella pneumoniae DETECTED (A) NOT DETECTED Final    Comment: CRITICAL RESULT CALLED TO, READ BACK BY AND VERIFIED WITH: CHRISTINE SHADE PHARMD _1  08/05/20 EB    Proteus species NOT DETECTED NOT DETECTED Final   Salmonella species NOT DETECTED NOT DETECTED Final   Serratia marcescens NOT DETECTED NOT DETECTED Final   Haemophilus influenzae NOT DETECTED NOT DETECTED Final   Neisseria meningitidis NOT DETECTED NOT DETECTED Final   Pseudomonas aeruginosa NOT DETECTED NOT DETECTED Final   Stenotrophomonas maltophilia NOT DETECTED NOT DETECTED Final   Candida albicans NOT DETECTED NOT DETECTED Final   Candida auris NOT DETECTED NOT DETECTED Final   Candida glabrata NOT DETECTED NOT DETECTED Final   Candida krusei NOT DETECTED NOT DETECTED Final   Candida parapsilosis NOT DETECTED NOT DETECTED Final   Candida tropicalis NOT DETECTED NOT DETECTED Final   Cryptococcus neoformans/gattii NOT DETECTED NOT DETECTED Final   CTX-M ESBL NOT DETECTED NOT DETECTED Final   Carbapenem resistance IMP NOT DETECTED NOT DETECTED Final   Carbapenem resistance KPC NOT DETECTED NOT DETECTED Final   Carbapenem resistance NDM NOT DETECTED NOT DETECTED Final   Carbapenem resist OXA 48 LIKE NOT DETECTED NOT DETECTED Final   Carbapenem resistance VIM NOT DETECTED NOT DETECTED Final    Comment: Performed at [facility], [address], [place], [place] [address]  Resp Panel by RT-PCR (Flu A&B, Covid) Nasopharyngeal Swab     Status: None   Collection Time: 08/04/20  7:45 PM  Specimen: Nasopharyngeal Swab; Nasopharyngeal(NP) swabs in vial transport medium  Result Value Ref Range Status   SARS Coronavirus 2 by RT PCR NEGATIVE NEGATIVE Final    Comment: (NOTE) SARS-CoV-2 target nucleic acids are NOT DETECTED.  The SARS-CoV-2 RNA is generally detectable in upper  respiratory specimens during the acute phase of infection. The lowest concentration of SARS-CoV-2 viral copies this assay can detect is 138 copies/mL. A negative result does not preclude SARS-Cov-2 infection and should not be used as the sole basis for treatment or other patient management decisions. A negative result may occur with  improper specimen collection/handling, submission of specimen other than nasopharyngeal swab, presence of viral mutation(s) within the areas targeted by this assay, and inadequate number of viral copies(<138 copies/mL). A negative result must be combined with clinical observations, patient history, and epidemiological information. The expected result is Negative.  Fact Sheet for Patients:  EntrepreneurPulse.com.au  Fact Sheet for Healthcare Providers:  IncredibleEmployment.be  This test is no t yet approved or cleared by the [place] FDA and  has been authorized for detection and/or diagnosis of SARS-CoV-2 by FDA under an Emergency Use Authorization (EUA). This EUA will remain  in effect (meaning this test can be used) for the duration of the COVID-19 declaration under Section 564(b)(1) of the Act, 21 U.S.C.section 360bbb-3(b)(1), unless the authorization is terminated  or revoked sooner.       Influenza A by PCR NEGATIVE NEGATIVE Final   Influenza B by PCR NEGATIVE NEGATIVE Final    Comment: (NOTE) The Xpert Xpress SARS-CoV-2/FLU/RSV plus assay is intended as an aid in the diagnosis of influenza from Nasopharyngeal swab specimens and should not be used as a sole basis for treatment. Nasal washings and aspirates are unacceptable for Xpert Xpress SARS-CoV-2/FLU/RSV testing.  Fact Sheet for Patients: EntrepreneurPulse.com.au  Fact Sheet for Healthcare Providers: IncredibleEmployment.be  This test is not yet approved or cleared by the [place] FDA and has been  authorized for detection and/or diagnosis of SARS-CoV-2 by FDA under an Emergency Use Authorization (EUA). This EUA will remain in effect (meaning this test can be used) for the duration of the COVID-19 declaration under Section 564(b)(1) of the Act, 21 U.S.C. section 360bbb-3(b)(1), unless the authorization is terminated or revoked.  Performed at [facility], [place] [address], [place], [place] [address]   C Difficile Quick Screen w PCR reflex     Status: None   Collection Time: 08/06/20  1:41 PM   Specimen: STOOL  Result Value Ref Range Status   C Diff antigen NEGATIVE NEGATIVE Final   C Diff toxin NEGATIVE NEGATIVE Final   C Diff interpretation NEGATIVE  Final    Comment: Performed at [facility], [place] [address], [place], [place] [address]  MRSA Next Gen by PCR, Nasal     Status: None   Collection Time: 08/07/20  9:00 PM   Specimen: Nasal Mucosa; Nasal Swab  Result Value Ref Range Status   MRSA by PCR Next Gen NOT DETECTED NOT DETECTED Final    Comment: (NOTE) The GeneXpert MRSA Assay (FDA approved for NASAL specimens only), is one component of a comprehensive MRSA colonization surveillance program. It is not intended to diagnose MRSA infection nor to guide or monitor treatment for MRSA infections. Test performance is not FDA approved in patients less than 36 years old. Performed at [facility], [place] [address], [place], [place] [address]      Radiology Studies: No results found. DG ERCP  BILIARY & PANCREATIC DUCTS  Final Result    DG CHEST PORT 1 VIEW  Final Result    DG CHEST PORT 1 VIEW  Final Result    DG Chest Port 1 View  Final Result    US Abdomen Limited RUQ (LIVER/GB)  Final Result    CT ABDOMEN PELVIS W CONTRAST  Final Result    DG Chest Portable 1 View  Final Result      Scheduled Meds:  [MAR Hold] Chlorhexidine Gluconate Cloth  6 each Topical Daily   [MAR Hold] diltiazem  240 mg Oral  Daily   [MAR Hold] mouth rinse  15 mL Mouth Rinse BID   [MAR Hold] metoprolol tartrate  50 mg Oral BID   [MAR Hold] metroNIDAZOLE  500 mg Oral Q12H   PRN Meds: [MAR Hold] acetaminophen, [MAR Hold] alum & mag hydroxide-simeth, [MAR Hold] hydrALAZINE, [MAR Hold] labetalol, lactated ringers, [MAR Hold] metoprolol tartrate, [MAR Hold] ondansetron (ZOFRAN) IV, [MAR Hold] traMADol, [MAR Hold] traZODone Continuous Infusions:  sodium chloride     sodium chloride     [MAR Hold]  ceFAZolin (ANCEF) IV 2 g (08/14/20 0535)   [MAR Hold] diltiazem (CARDIZEM) infusion 5 mg/hr (08/14/20 1227)   lactated ringers 10 mL/hr at 08/11/20 1125   lactated ringers 10 mL/hr at 08/14/20 1227     LOS: 9 days  Time spent: Greater than 50% of the 35 minute visit was spent in counseling/coordination of care for the patient as laid out in the A&P.   Dwyane Dee, MD [facility] 08/14/2020, 1:14 PM

## 2020-08-14 NOTE — Anesthesia Procedure Notes (Signed)
Procedure Name: Intubation Date/Time: 08/14/2020 12:59 PM Performed by: Lavina Hamman, CRNA Pre-anesthesia Checklist: Patient identified, Emergency Drugs available, Suction available, Patient being monitored and Timeout performed Patient Re-evaluated:Patient Re-evaluated prior to induction Oxygen Delivery Method: Circle system utilized Preoxygenation: Pre-oxygenation with 100% oxygen Induction Type: IV induction Ventilation: Mask ventilation without difficulty Laryngoscope Size: Mac and 3 Grade View: Grade I Tube type: Oral Tube size: 7.0 mm Number of attempts: 1 Airway Equipment and Method: Stylet Placement Confirmation: ETT inserted through vocal cords under direct vision, positive ETCO2, CO2 detector and breath sounds checked- equal and bilateral Secured at: 22 cm Tube secured with: Tape Dental Injury: Teeth and Oropharynx as per pre-operative assessment  Comments: ATOI

## 2020-08-14 NOTE — Anesthesia Preprocedure Evaluation (Signed)
Anesthesia Evaluation  Patient identified by MRN, date of birth, ID band Patient awake  General Assessment Comment:Klebsiella sepsis  Reviewed: Allergy & Precautions, NPO status , Patient's Chart, lab work & pertinent test results  Airway Mallampati: II  TM Distance: >3 FB Neck ROM: Full    Dental no notable dental hx.    Pulmonary neg pulmonary ROS, former smoker,    Pulmonary exam normal breath sounds clear to auscultation       Cardiovascular hypertension, Normal cardiovascular exam+ dysrhythmias Atrial Fibrillation + Valvular Problems/Murmurs MR  Rhythm:Regular Rate:Normal     Neuro/Psych negative neurological ROS  negative psych ROS   GI/Hepatic negative GI ROS, Neg liver ROS,   Endo/Other  Pancreatic cancer  Renal/GU negative Renal ROS  negative genitourinary   Musculoskeletal negative musculoskeletal ROS (+)   Abdominal   Peds negative pediatric ROS (+)  Hematology negative hematology ROS (+) anemia ,   Anesthesia Other Findings   Reproductive/Obstetrics negative OB ROS                             Anesthesia Physical Anesthesia Plan  ASA: 4  Anesthesia Plan: General   Post-op Pain Management:    Induction: Intravenous  PONV Risk Score and Plan: 2 and Ondansetron, Dexamethasone and Treatment may vary due to age or medical condition  Airway Management Planned: Oral ETT  Additional Equipment:   Intra-op Plan:   Post-operative Plan: Extubation in OR  Informed Consent: I have reviewed the patients History and Physical, chart, labs and discussed the procedure including the risks, benefits and alternatives for the proposed anesthesia with the patient or authorized representative who has indicated his/her understanding and acceptance.     Dental advisory given  Plan Discussed with: CRNA and Surgeon  Anesthesia Plan Comments:         Anesthesia Quick Evaluation

## 2020-08-14 NOTE — Transfer of Care (Signed)
Immediate Anesthesia Transfer of Care Note  Patient: Barry Contreras.  Procedure(s) Performed: Procedure(s): ENDOSCOPIC RETROGRADE CHOLANGIOPANCREATOGRAPHY (ERCP) (N/A) BILIARY STENT PLACEMENT (N/A)  Patient Location: PACU  Anesthesia Type:General  Level of Consciousness:  sedated, patient cooperative and responds to stimulation  Airway & Oxygen Therapy:Patient Spontanous Breathing and Patient connected to face mask oxgen  Post-op Assessment:  Report given to PACU RN and Post -op Vital signs reviewed and stable  Post vital signs:  Reviewed and stable  Last Vitals:  Vitals:   08/14/20 1115 08/14/20 1356  BP: (!) 159/94 121/64  Pulse: (!) 105 91  Resp: (!) 21 19  Temp: 37.3 C   SpO2: [id number]    Complications: No apparent anesthesia complications

## 2020-08-14 NOTE — Progress Notes (Signed)
[place] for heparin Indication: atrial fibrillation  No Known Allergies  Patient Measurements: Height: '5\' 2"'$  (157.5 cm) Weight: 73.5 kg (162 lb) IBW/kg (Calculated) : 54.6 Heparin Dosing Weight: 73 kg  Vital Signs: Temp: 99.1 F (37.3 C) (08/08 0354) Temp Source: Oral (08/08 0354) BP: 155/93 (08/08 0354) Pulse Rate: 100 (08/08 0354)  Labs: Recent Labs    08/11/20 2336 08/12/20 0311 08/12/20 0745 08/13/20 0304 08/13/20 1132 08/13/20 2049 08/14/20 0437  HGB  --   --   --  10.7*  --   --  10.9*  HCT  --   --   --  32.9*  --   --  32.0*  PLT  --   --   --  336  --   --  326  APTT 77*  --   --   --   --   --   --   HEPARINUNFRC 0.46  --    < > 0.23* 0.25* 0.44 0.47  CREATININE  --  0.55*  --  0.44*  --   --  0.46*   < > = values in this interval not displayed.     Estimated Creatinine Clearance: 58.3 mL/min (A) (by C-G formula based on SCr of 0.46 mg/dL (L)).   Medications:  - on Eliquis '5mg'$  bid PTA  Assessment: Patient is an 87 y.o M with hx pancreatic cancer with obstructive juandice (s/p bilary stent placement on 07/04/20) and afib on Eliquis PTA presented to the ED on 7/29 with fever and generalized weakness.  He was found to have elevated LFTs  with signs of cholangitis and underwent ERCP on 8/5 (noted to have one partially occluded stent).  He's currently on heparin drip while Eliquis is on hold.  Today, 08/14/2020: - 0437 AM heparin level therapeutic at 0.47 on drip infusing at 1600 units/hr- drip stopped at 6 am as planned - plan for repeat ERCP on 8/8. GI team recom to hold heparin drip at 0600 on 8/8 in anticipation for procedure at 1130. - CBC stable - no bleeding documented  Goal of Therapy:  Heparin level 0.3-0.7 units/ml Monitor platelets by anticoagulation protocol: Yes   Plan:  -  heparin drip stopped at 6 am for ERCP - f/u anticoagulation plans post procedure - Daily heparin level/CBC  Eudelia Bunch,  Pharm.D 08/14/2020 7:19 AM

## 2020-08-14 NOTE — Anesthesia Postprocedure Evaluation (Signed)
Anesthesia Post Note  Patient: Barry Contreras.  Procedure(s) Performed: ENDOSCOPIC RETROGRADE CHOLANGIOPANCREATOGRAPHY (ERCP) BILIARY STENT PLACEMENT REMOVAL OF STONES     Patient location during evaluation: PACU Anesthesia Type: General Level of consciousness: awake and alert Pain management: pain level controlled Vital Signs Assessment: post-procedure vital signs reviewed and stable Respiratory status: spontaneous breathing, nonlabored ventilation, respiratory function stable and patient connected to nasal cannula oxygen Cardiovascular status: blood pressure returned to baseline and stable Postop Assessment: no apparent nausea or vomiting Anesthetic complications: no   No notable events documented.  Last Vitals:  Vitals:   08/14/20 1115 08/14/20 1356  BP: (!) 159/94 121/64  Pulse: (!) 105 91  Resp: (!) 21 19  Temp: 37.3 C 36.5 C  SpO2: 95% 91%    Last Pain:  Vitals:   08/14/20 1356  TempSrc: Axillary  PainSc: 0-No pain                 Stesha Neyens S

## 2020-08-14 NOTE — Progress Notes (Signed)
Barry Contreras. 12:28 PM  Subjective: Patient is doing about the same since I last saw him and his case discussed with my partner Dr. Alessandra Bevels and his hospital computer chart reviewed he has no new complaints  Objective: Vital signs stable afebrile no acute distress obviously jaundiced exam please see preassessment evaluation labs reviewed white count increased  Assessment: Obstructive jaundice not helped with cleaning out the stent with a balloon tumor ingrowth must be more significant and appeared on last week's cholangiogram  Plan: Okay to proceed with ERCP and probable place a stent inside the stent  [facility]  office [phone] After 5PM or if no answer call [phone]

## 2020-08-14 NOTE — Progress Notes (Signed)
Chaplain engaged in an initial visit with Barry Contreras' family.  Chaplain checked in with them as they waited for Gar to come back from a procedure.  Chaplain offered support and refreshments to them.  Chaplain was able to bring water and crackers to family.    Chaplain is available to follow-up as needed.    08/14/20 1200  Clinical Encounter Type  Visited With Family  Visit Type Initial;Social support

## 2020-08-14 NOTE — Plan of Care (Signed)

## 2020-08-15 ENCOUNTER — Encounter (HOSPITAL_COMMUNITY): Payer: Self-pay | Admitting: Gastroenterology

## 2020-08-15 ENCOUNTER — Encounter: Payer: Self-pay | Admitting: Genetic Counselor

## 2020-08-15 ENCOUNTER — Other Ambulatory Visit: Payer: Self-pay | Admitting: Radiology

## 2020-08-15 DIAGNOSIS — A4159 Other Gram-negative sepsis: Secondary | ICD-10-CM | POA: Diagnosis not present

## 2020-08-15 DIAGNOSIS — I5031 Acute diastolic (congestive) heart failure: Secondary | ICD-10-CM | POA: Diagnosis not present

## 2020-08-15 DIAGNOSIS — Z1379 Encounter for other screening for genetic and chromosomal anomalies: Secondary | ICD-10-CM | POA: Insufficient documentation

## 2020-08-15 DIAGNOSIS — C250 Malignant neoplasm of head of pancreas: Secondary | ICD-10-CM | POA: Diagnosis not present

## 2020-08-15 DIAGNOSIS — J9601 Acute respiratory failure with hypoxia: Secondary | ICD-10-CM | POA: Diagnosis not present

## 2020-08-15 LAB — COMPREHENSIVE METABOLIC PANEL
ALT: 53 U/L — ABNORMAL HIGH (ref 0–44)
AST: 63 U/L — ABNORMAL HIGH (ref 15–41)
Albumin: 2.4 g/dL — ABNORMAL LOW (ref 3.5–5.0)
Alkaline Phosphatase: 546 U/L — ABNORMAL HIGH (ref 38–126)
Anion gap: 10 (ref 5–15)
BUN: 13 mg/dL (ref 8–23)
CO2: 24 mmol/L (ref 22–32)
Calcium: 8.5 mg/dL — ABNORMAL LOW (ref 8.9–10.3)
Chloride: 101 mmol/L (ref 98–111)
Creatinine, Ser: 0.5 mg/dL — ABNORMAL LOW (ref 0.61–1.24)
GFR, Estimated: >60 mL/min (ref >60)
Glucose, Bld: 165 mg/dL — ABNORMAL HIGH (ref 70–99)
Potassium: 4.1 mmol/L (ref 3.5–5.1)
Sodium: 135 mmol/L (ref 135–145)
Total Bilirubin: 5 mg/dL — ABNORMAL HIGH (ref 0.3–1.2)
Total Protein: 6 g/dL — ABNORMAL LOW (ref 6.5–8.1)

## 2020-08-15 LAB — CBC WITH DIFFERENTIAL/PLATELET
Abs Immature Granulocytes: 0.17 10*3/uL — ABNORMAL HIGH (ref 0.00–0.07)
Basophils Absolute: 0 10*3/uL (ref 0.0–0.1)
Basophils Relative: 0 %
Eosinophils Absolute: 0 10*3/uL (ref 0.0–0.5)
Eosinophils Relative: 0 %
HCT: 31.1 % — ABNORMAL LOW (ref 39.0–52.0)
Hemoglobin: 10.5 g/dL — ABNORMAL LOW (ref 13.0–17.0)
Immature Granulocytes: 1 %
Lymphocytes Relative: 9 %
Lymphs Abs: 1.3 10*3/uL (ref 0.7–4.0)
MCH: 30.6 pg (ref 26.0–34.0)
MCHC: 33.8 g/dL (ref 30.0–36.0)
MCV: 90.7 fL (ref 80.0–100.0)
Monocytes Absolute: 0.8 10*3/uL (ref 0.1–1.0)
Monocytes Relative: 5 %
Neutro Abs: 12.9 10*3/uL — ABNORMAL HIGH (ref 1.7–7.7)
Neutrophils Relative %: 85 %
Platelets: 322 10*3/uL (ref 150–400)
RBC: 3.43 MIL/uL — ABNORMAL LOW (ref 4.22–5.81)
RDW: 15.3 % (ref 11.5–15.5)
WBC: 15.1 10*3/uL — ABNORMAL HIGH (ref 4.0–10.5)
nRBC: 0 % (ref 0.0–0.2)

## 2020-08-15 LAB — MAGNESIUM: Magnesium: 2.2 mg/dL (ref 1.7–2.4)

## 2020-08-15 LAB — HEPARIN LEVEL (UNFRACTIONATED): Heparin Unfractionated: 0.44 IU/mL (ref 0.30–0.70)

## 2020-08-15 MED ORDER — BENAZEPRIL HCL 20 MG PO TABS
20.0000 mg | ORAL_TABLET | Freq: Every day | ORAL | Status: DC
  Administered 2020-08-15 – 2020-08-16 (×2): 20 mg via ORAL
  Filled 2020-08-15 (×2): qty 1

## 2020-08-15 MED ORDER — HYDROCHLOROTHIAZIDE 12.5 MG PO CAPS
12.5000 mg | ORAL_CAPSULE | Freq: Every day | ORAL | Status: DC
  Administered 2020-08-15 – 2020-08-16 (×2): 12.5 mg via ORAL
  Filled 2020-08-15 (×2): qty 1

## 2020-08-15 MED ORDER — BENAZEPRIL-HYDROCHLOROTHIAZIDE 20-12.5 MG PO TABS: 1.0000 | ORAL_TABLET | Freq: Every day | ORAL | Status: DC

## 2020-08-15 MED ORDER — METOPROLOL TARTRATE 50 MG PO TABS
75.0000 mg | ORAL_TABLET | Freq: Two times a day (BID) | ORAL | Status: DC
  Administered 2020-08-15 – 2020-08-16 (×4): 75 mg via ORAL
  Filled 2020-08-15 (×4): qty 1

## 2020-08-15 MED ORDER — METOPROLOL TARTRATE 50 MG PO TABS: 75.0000 mg | ORAL_TABLET | Freq: Two times a day (BID) | ORAL | Status: DC

## 2020-08-15 MED ORDER — NYSTATIN 100000 UNIT/ML MT SUSP
5.0000 mL | Freq: Four times a day (QID) | OROMUCOSAL | Status: DC
  Administered 2020-08-15 – 2020-08-16 (×8): 500000 [IU] via ORAL
  Filled 2020-08-15 (×8): qty 5

## 2020-08-15 NOTE — Progress Notes (Signed)
Progress Note    Barry Contreras.   SWF:[id number]  DOB: 1931/08/17  DOA: 08/04/2020     10  PCP: Lajean Manes, MD  CC: N/V, fever, chills  Hospital Course: Barry Contreras is an 87 year old Caucasian male with PMH pancreatic cancer, history of obstructive jaundice status post biliary stent placement 07/04/20, permanent atrial fibrillation on Eliquis, hypertension, prediabetes as well as other comorbidities who presented with a chief complaint of fever, nausea, vomiting, chills, and rigors.   In the ED is found to be tachycardic and tachypneic with a lactic acid of 3.4 and abnormal LFTs.  He underwent a CT of the abdomen and pelvis which showed pneumobilia with slight gallbladder wall enhancement and wall thickening.  A dedicated right upper quadrant ultrasound showed gallbladder wall thickening with no visible stone.   Case was discussed with GI.  Patient was started on antibiotics.  Blood cultures ultimately speciated out Klebsiella. Patient also developed A. fib with RVR during hospitalization.  Cardiology was consulted and he ultimately required initiation of a Cardizem drip. Due to uptrending LFTs, he underwent ERCP on 08/11/20 as well (see below). LFTs continued to trend up post 1st ERCP and plan is for repeat ERCP on 08/14/20.   Interval History:  No events overnight. He is more awake and alert today.  Starting to feel a little better after repeat ERCP yesterday. He was happy to hear that his liver enzymes numbers have improved today.  ROS: Constitutional: negative for chills and fevers, Respiratory: negative for cough and sputum, Cardiovascular: negative for chest pain, and Gastrointestinal: positive for abdominal pain  Assessment & Plan: * Sepsis due to Klebsiella (HCC)-resolved as of 08/09/2020 - Patient met sepsis criteria on admission; febrile, tachycardic and had a leukocytosis; source considered bacteremia likely translocation from GI source - see bacteremia   Leukocytosis -  suspect increased leukocytosis possibly reactive in nature from ERCP on 8/5 (no bandemia appreciated) - still no further concern for new/recurrent infection - d/c abx on 8/9; leukocytosis improving - see bacteremia as well  Bacteremia due to Gram-negative bacteria - 4/4 bottles with klebsiella noted on 7/29 cultures - transitioned to Rocephin; goal is 10 days treatment (day 10 was 8/7, course completed on 8/7 for bacteremia - repeat blood cultures on 8/8 (1/4 GPC, this is a probable contaminate and will not cover; repeat blood culture again on 8/10) - if blood culture negative at 48 hours likely can d/c abx if okay with GI: will have abx discontinued on 8/9 and monitor off  Elevated LFTs - LFT pattern consistent with obstructive pattern as seen on ERCP 8/5 with sludge partially occluding biliary stent and tumor ingrowth - TB still uptrending s/p 1st ERCP; up to 12.7 on 8/8 - per report, sludge partially occluding stent which was removed; tumor ingrowth noted as well and may still require stent within stent - s/p repeat ERCP on 08/14/20 and now LFTs starting to downtrend - d/c abx as noted above   Cancer of head of pancreas ([facility]) - stage IV; mets to liver; diagnosed on EUS 07/04/20 s/p biopsy with adenocarcinoma - now s/p CBD stent on 6/28 - tentative plan is patient wishes for chemo for prolonging life some but also wishes to have a quality of life - currently prognosis seems guarded/poor - s/p ERCP x 2 inpatient - oncology pursuing inpatient port placement (continuing heparin gtt until placed)  Acute diastolic CHF (congestive heart failure) ([place]) - elevated BNP; CXR shows vascular congestion - etiology possibly from afib  with RVR - cardiology following and patient on lasix per cardiology - continue I&O - continue cardizem  - s/p echo: EF 70-75%, unable to grade diastology 2/2 afib  Permanent atrial fibrillation [facility]) - cardiology following as well - has been on cardizem drip and  PO - remaining on drip until procedures are concluded (still possibility of repeat ERCP) - continue holding eliquis; continue heparin drip until LFTs consistently downtrending x 48 hours and until port is placed, then okay to resume Eliquis   Acute respiratory failure with hypoxia ([place]) - continue O2, wean as able   Normocytic anemia -Likely dilutional drop -iron 17, TIBC of 241, saturation ratios of 7%, ferritin level 333, folate level 14.1, and vitamin B12 level 1477 - CBC daily   Hypophosphatemia - Replete and recheck as needed  Hypokalemia - Replete and recheck as needed  Hypertension - continue cardizem and lopressor -Blood pressure was initially low/normal throughout admission.  Pressure has since recovered and ERCPs have been performed.  Blood pressure still adequate - Resume benazepril-HCTZ on 08/15/2020   Old records reviewed in assessment of this patient  Antimicrobials: Cefepime 08/04/2020 >> 08/05/2020 Augmentin 08/07/2020 >> 08/08/2020 Rocephin 08/05/2020 >> 08/09/20 Ancef 08/09/20 >> 08/15/2020 Flagyl 08/08/20 >> 08/15/2020  DVT prophylaxis: Heparin drip (on eliquis at home)   Code Status:   Code Status: Full Code Family Communication: daughter   Disposition Plan: Status is: Inpatient  Remains inpatient appropriate because:IV treatments appropriate due to intensity of illness or inability to take PO and Inpatient level of care appropriate due to severity of illness  Dispo: The patient is from: Home              Anticipated d/c is to: Home              Patient currently is not medically stable to d/c.   Difficult to place patient No  Risk of unplanned readmission score: Unplanned Admission- Pilot do not use: 25.76   Objective: Blood pressure (!) 130/95, pulse 97, temperature 97.8 F (36.6 C), temperature source Oral, resp. rate 16, height _0  (1.575 m), weight 73.5 kg, SpO2 95 %.  Examination: General appearance: alert, cooperative, fatigued, and no distress Head:  Normocephalic, without obvious abnormality, atraumatic Eyes:  EOMI , scleral icterus appreciated  Lungs: clear to auscultation bilaterally Heart: regular rate and rhythm and S1, S2 normal Abdomen:  improving right quadrants tenderness Extremities:  trace LE edema Skin:  jaundiced Neurologic: Grossly normal  Consultants:  Oncology Cardiology GI  Procedures:  ERCP, 08/11/20 Repeat ERCP, 08/14/20  Data Reviewed: I have personally reviewed following labs and imaging studies Results for orders placed or performed during the hospital encounter of 08/04/20 (from the past 24 hour(s))  CBC with Differential/Platelet     Status: Abnormal   Collection Time: 08/15/20  1:06 AM  Result Value Ref Range   WBC 15.1 (H) 4.0 - 10.5 K/uL   RBC 3.43 (L) 4.22 - 5.81 MIL/uL   Hemoglobin 10.5 (L) 13.0 - 17.0 g/dL   HCT 31.1 (L) 39.0 - 52.0 %   MCV 90.7 80.0 - 100.0 fL   MCH 30.6 26.0 - 34.0 pg   MCHC 33.8 30.0 - 36.0 g/dL   RDW 15.3 11.5 - 15.5 %   Platelets 322 150 - 400 K/uL   nRBC 0.0 0.0 - 0.2 %   Neutrophils Relative % 85 %   Neutro Abs 12.9 (H) 1.7 - 7.7 K/uL   Lymphocytes Relative 9 %   Lymphs Abs 1.3  0.7 - 4.0 K/uL   Monocytes Relative 5 %   Monocytes Absolute 0.8 0.1 - 1.0 K/uL   Eosinophils Relative 0 %   Eosinophils Absolute 0.0 0.0 - 0.5 K/uL   Basophils Relative 0 %   Basophils Absolute 0.0 0.0 - 0.1 K/uL   Immature Granulocytes 1 %   Abs Immature Granulocytes 0.17 (H) 0.00 - 0.07 K/uL  Comprehensive metabolic panel     Status: Abnormal   Collection Time: 08/15/20  1:06 AM  Result Value Ref Range   Sodium 135 135 - 145 mmol/L   Potassium 4.1 3.5 - 5.1 mmol/L   Chloride 101 98 - 111 mmol/L   CO2 24 22 - 32 mmol/L   Glucose, Bld 165 (H) 70 - 99 mg/dL   BUN 13 8 - 23 mg/dL   Creatinine, Ser 0.50 (L) 0.61 - 1.24 mg/dL   Calcium 8.5 (L) 8.9 - 10.3 mg/dL   Total Protein 6.0 (L) 6.5 - 8.1 g/dL   Albumin 2.4 (L) 3.5 - 5.0 g/dL   AST 63 (H) 15 - 41 U/L   ALT 53 (H) 0 - 44 U/L    Alkaline Phosphatase 546 (H) 38 - 126 U/L   Total Bilirubin 5.0 (H) 0.3 - 1.2 mg/dL   GFR, Estimated >60 >60 mL/min   Anion gap 10 5 - 15  Magnesium     Status: None   Collection Time: 08/15/20  1:06 AM  Result Value Ref Range   Magnesium 2.2 1.7 - 2.4 mg/dL  Heparin level (unfractionated)     Status: None   Collection Time: 08/15/20  1:06 AM  Result Value Ref Range   Heparin Unfractionated 0.44 0.30 - 0.70 IU/mL    Recent Results (from the past 240 hour(s))  C Difficile Quick Screen w PCR reflex     Status: None   Collection Time: 08/06/20  1:41 PM   Specimen: STOOL  Result Value Ref Range Status   C Diff antigen NEGATIVE NEGATIVE Final   C Diff toxin NEGATIVE NEGATIVE Final   C Diff interpretation NEGATIVE  Final    Comment: Performed at [facility], [place] [address], [place], [place] [address]  MRSA Next Gen by PCR, Nasal     Status: None   Collection Time: 08/07/20  9:00 PM   Specimen: Nasal Mucosa; Nasal Swab  Result Value Ref Range Status   MRSA by PCR Next Gen NOT DETECTED NOT DETECTED Final    Comment: (NOTE) The GeneXpert MRSA Assay (FDA approved for NASAL specimens only), is one component of a comprehensive MRSA colonization surveillance program. It is not intended to diagnose MRSA infection nor to guide or monitor treatment for MRSA infections. Test performance is not FDA approved in patients less than 68 years old. Performed at [facility], [place] [address] [place], [place] [address]   Culture, blood (routine x 2)     Status: None (Preliminary result)   Collection Time: 08/14/20  4:37 AM   Specimen: BLOOD  Result Value Ref Range Status   Specimen Description   Final    BLOOD LEFT ANTECUBITAL Performed at [facility] [address] [place], [place] [address]    Special Requests   Final    BOTTLES DRAWN AEROBIC ONLY Blood Culture adequate volume Performed at [facility]  [address], [place], [place] [address]    Culture   Final    NO GROWTH < 24 HOURS Performed at [facility], [place] [address], [place], [place] [address]    Report Status PENDING  Incomplete  Culture, blood (routine x 2)     Status: None (Preliminary result)   Collection Time: 08/14/20  4:37 AM   Specimen: BLOOD  Result Value Ref Range Status   Specimen Description   Final    BLOOD BLOOD LEFT FOREARM Performed at [facility] [address] [place], [place] [address]    Special Requests   Final    BOTTLES DRAWN AEROBIC ONLY Blood Culture adequate volume Performed at [facility] [address] [place], [place] [address]    Culture  Setup Time   Final    GRAM POSITIVE COCCI AEROBIC BOTTLE ONLY CRITICAL VALUE NOTED.  VALUE IS CONSISTENT WITH PREVIOUSLY REPORTED AND CALLED VALUE. Performed at [facility], [place] [address], [place],  [address]    Culture PENDING  Incomplete   Report Status PENDING  Incomplete     Radiology Studies: DG ERCP  Result Date: 08/14/2020 CLINICAL DATA:  Biliary obstruction EXAM: ERCP TECHNIQUE: Multiple spot images obtained with the fluoroscopic device and submitted for interpretation post-procedure. COMPARISON:  08/11/2020 FINDINGS: Initial scout image demonstrates metallic biliary stent. Subsequent images document Skopic cannulation and opacification of the biliary tree with passage of balloon catheter. Final image documents placement of a second metallic stent within the first. There is incomplete visualization of the intrahepatic biliary tree which appears only mildly ectatic centrally. IMPRESSION: Endoscopic CBD cannulation and intervention as above. These images were submitted for radiologic interpretation only. Please see the procedural report for the amount of contrast and the fluoroscopy time utilized. Electronically Signed   By: Lucrezia Europe M.D.   On: 08/14/2020 14:40   DG ERCP  Final Result     DG ERCP BILIARY & PANCREATIC DUCTS  Final Result    DG CHEST PORT 1 VIEW  Final Result    DG CHEST PORT 1 VIEW  Final Result    DG Chest Port 1 View  Final Result    US Abdomen Limited RUQ (LIVER/GB)  Final Result    CT ABDOMEN PELVIS W CONTRAST  Final Result    DG Chest Portable 1 View  Final Result    IR IMAGING GUIDED PORT INSERTION    (Results Pending)    Scheduled Meds:  benazepril-hydrochlorthiazide  1 tablet Oral Daily   diltiazem  240 mg Oral Daily   mouth rinse  15 mL Mouth Rinse BID   metoprolol tartrate  75 mg Oral BID   nystatin  5 mL Oral QID   PRN Meds: acetaminophen, alum & mag hydroxide-simeth, hydrALAZINE, labetalol, metoprolol tartrate, ondansetron (ZOFRAN) IV, traMADol, traZODone Continuous Infusions:  diltiazem (CARDIZEM) infusion 5 mg/hr (08/14/20 2316)   heparin 1,600 Units/hr (08/15/20 0800)   lactated ringers 10 mL/hr at 08/11/20 1125     LOS: 10 days  Time spent: Greater than 50% of the 35 minute visit was spent in counseling/coordination of care for the patient as laid out in the A&P.   Dwyane Dee, MD [facility] 08/15/2020, 2:56 PM

## 2020-08-15 NOTE — Progress Notes (Addendum)
Progress Note  Patient Name: Barry Contreras. Date of Encounter: 08/15/2020  [facility] Cardiologist: Elouise Munroe, MD   Subjective   No acute overnight events. Patient states he is feeling better today compared to last week. Not as tired. Breathing fine. No chest pain or palpitations.   Inpatient Medications    Scheduled Meds:  Chlorhexidine Gluconate Cloth  6 each Topical Daily   diltiazem  240 mg Oral Daily   mouth rinse  15 mL Mouth Rinse BID   metoprolol tartrate  50 mg Oral BID   metroNIDAZOLE  500 mg Oral Q12H   Continuous Infusions:   ceFAZolin (ANCEF) IV 2 g (08/15/20 0529)   diltiazem (CARDIZEM) infusion 5 mg/hr (08/14/20 2316)   heparin 1,600 Units/hr (08/15/20 0800)   lactated ringers 10 mL/hr at 08/11/20 1125   PRN Meds: acetaminophen, alum & mag hydroxide-simeth, hydrALAZINE, labetalol, metoprolol tartrate, ondansetron (ZOFRAN) IV, traMADol, traZODone   Vital Signs    Vitals:   08/14/20 1410 08/14/20 1449 08/14/20 1946 08/15/20 0459  BP: 108/65 107/67 139/79 (!) 148/96  Pulse: 86 73 89 92  Resp: '12 16 18 13  '$ Temp:  98.1 F (36.7 C) 98.4 F (36.9 C) 97.6 F (36.4 C)  TempSrc:  Oral Oral Oral  SpO2: 95% 94% 96% 99%  Weight:      Height:        Intake/Output Summary (Last 24 hours) at 08/15/2020 1002 Last data filed at 08/15/2020 0855 Gross per 24 hour  Intake 876.87 ml  Output 1650 ml  Net -773.13 ml   Last 3 Weights 08/14/2020 08/04/2020 07/24/2020  Weight (lbs) 162 lb 0.6 oz 162 lb 162 lb 3.2 oz  Weight (kg) 73.5 kg 73.483 kg 73.573 kg      Telemetry    Atrial fibrillation. Rates in the 80s overnight but in the low 100s to 110s this morning. - Personally Reviewed  ECG    No new ECG tracing today. - Personally Reviewed  Physical Exam   GEN: No acute distress.   Neck: No JVD Cardiac: Mildly tachycardic with irregularly irregular rhythm. Soft II/VI systolic murmur. No rubs or gallops.  Respiratory: Clear to auscultation  bilaterally. GI: Soft and mildly distended. Bowel sounds present. MS: Trace lower extremity edema bilaterally. No deformity. Skin: Warm and dry. Neuro:  No focal deficits. Psych: Normal affect. Responds appropriately.  Labs    High Sensitivity Troponin:   Recent Labs  Lab 08/07/20 2045 08/07/20 2214  TROPONINIHS 12 12      Chemistry Recent Labs  Lab 08/13/20 0304 08/14/20 0437 08/15/20 0106  NA 128* 129* 135  K 3.9 3.7 4.1  CL 101 97* 101  CO2 21* 23 24  GLUCOSE 143* 116* 165*  BUN '20 12 13  '$ CREATININE 0.44* 0.46* 0.50*  CALCIUM 8.2* 8.1* 8.5*  PROT 5.9* 6.2* 6.0*  ALBUMIN 2.6* 2.5* 2.4*  AST 121* 103* 63*  ALT 83* 69* 53*  ALKPHOS 590* 605* 546*  BILITOT 9.9* 12.7* 5.0*  GFRNONAA >60 >60 >60  ANIONGAP '6 9 10     '$ Hematology Recent Labs  Lab 08/13/20 0304 08/14/20 0437 08/15/20 0106  WBC 20.4* 18.1* 15.1*  RBC 3.62* 3.57* 3.43*  HGB 10.7* 10.9* 10.5*  HCT 32.9* 32.0* 31.1*  MCV 90.9 89.6 90.7  MCH 29.6 30.5 30.6  MCHC 32.5 34.1 33.8  RDW 15.3 15.4 15.3  PLT 336 326 322    BNPNo results for input(s): BNP, PROBNP in the last 168 hours.  DDimer No results for input(s): DDIMER in the last 168 hours.   Radiology    DG ERCP  Result Date: 08/14/2020 CLINICAL DATA:  Biliary obstruction EXAM: ERCP TECHNIQUE: Multiple spot images obtained with the fluoroscopic device and submitted for interpretation post-procedure. COMPARISON:  08/11/2020 FINDINGS: Initial scout image demonstrates metallic biliary stent. Subsequent images document Skopic cannulation and opacification of the biliary tree with passage of balloon catheter. Final image documents placement of a second metallic stent within the first. There is incomplete visualization of the intrahepatic biliary tree which appears only mildly ectatic centrally. IMPRESSION: Endoscopic CBD cannulation and intervention as above. These images were submitted for radiologic interpretation only. Please see the procedural  report for the amount of contrast and the fluoroscopy time utilized. Electronically Signed   By: Lucrezia Europe M.D.   On: 08/14/2020 14:40    Cardiac Studies   Echocardiogram 08/07/2020: Impressions: 1. Left ventricular ejection fraction, by estimation, is 70 to 75%. The  left ventricle has hyperdynamic function. The left ventricle has no  regional wall motion abnormalities. Left ventricular diastolic function  could not be evaluated.   2. Right ventricular systolic function is normal. The right ventricular  size is normal. There is mildly elevated pulmonary artery systolic  pressure.   3. Left atrial size was mildly dilated.   4. The mitral valve is normal in structure. Mild mitral valve  regurgitation. No evidence of mitral stenosis.   5. The aortic valve is tricuspid. There is mild calcification of the  aortic valve. There is mild thickening of the aortic valve. Aortic valve  regurgitation is mild to moderate. Mild to moderate aortic valve  sclerosis/calcification is present, without  any evidence of aortic stenosis.   6. There is mild dilatation of the ascending aorta, measuring 39 mm.   7. The inferior vena cava is normal in size with greater than 50%  respiratory variability, suggesting right atrial pressure of 3 mmHg.   Comparison(s): Prior images reviewed side by side. Degenerative aortic valve changes and the degree of aortic insufficiency is slightly worse. The ascending aorta is not seen as clearly on the current study and was previously measured as larger.  Patient Profile     87 y.o. male with a history of permanent atrial fibrillation on Eliquis, ascending thoracic aortic aneurysm followed by Dr. [name], hypertension, pancreatic cancer with obstructive juandice s/p biliary stent. Admitted on 08/04/2020 with sepsis secondary to intrabdominal infection. Cardiology consulted on 08/08/2020 for evaluation of atrial fibrillation with RVR at the request of Dr. [name].  Assessment &  Plan    Permanent Atrial Fibrillation - Rates in the 80s overnight but in the 100s to 110s this morning. Suspect deconditioning is playing a role in slightly elevated rates. - Electrolytes and TSH normal. - Echo showed LVEF of 70-75% with normal wall motion. - Currently on IV Diltiazem as well as PO Diltiazem '240mg'$  daily and Lopressor '50mg'$  twice daily. Plan was to continue IV Diltiazem through periprocedural period until ERCP was completed. Patient underwent 2nd ERCP this admission yesterday and had stent placed to common bile duct. GI is not planning on any more procedures this admission. Will increase Lopressor to '75mg'$  twice daily and continue currently dose of PO Diltiazem. Can then try to discontinue IV Diltiazem. - Patient has been on IV Heparin given need for multiple ERCPs. May have Port-A-Cath placed today. Can restart Eliquis when clear no other procedures will be done.   Acute Diastolic CHF - Patient developed acute shortness  of breath on 08/07/2020. Occurred after receiving IV fluids in setting of sepsis. - BNP 371. - Chest x-ray 8/3 showed cardiomegaly with bilateral interstitial prominence and small bilateral pleural effusions slightly progressed from prior exam. - Has received multiple dose of IV Lasix. Last dose on 8/3. Net negative 815 mL this admission.  Renal function stable.  - Appears euvolemic on exam. No additional Lasix needed.  - Continue to monitor volume status closely.   Ascending Thoracic Aorta Aneurysm - Stable at 4.2cm on last chest CTA in 07/2019. - Not clearly seen on Echo this admission but noted to be 3.9 cm. - Follow-up with Dr. [name] as outpatient.   Hypertension - BP mildly elevated at times.  - Will increase Lopressor to '75mg'$  twice daily. - Continue PO Diltiazem '240mg'$  daily. Will hopefully be able to wean off IV Diltiazem soon. - Consider restarting Benazepril-HCTZ 20-12.'5mg'$  if BP remains elevated with above changes.  Otherwise, per primary team: -  Sepsis secondary to intrabdominal infection and Klebsiella bacteremia - Metastatic pancreatic cancer with mets to the liver s/p biliary stent in 06/2020 - Elevated LFTs - Metabolic acidosis - Normocytic anemia - Hypophosphatemia   For questions or updates, please contact [facility] Please consult [email or web address] for contact info under        Signed, Darreld Mclean, PA-C  08/15/2020, 10:02 AM     I have seen and examined the patient along with Darreld Mclean, PA-C .  I have reviewed the chart, notes and new data.  I agree with PA/NP's note.  Key new complaints: currently he has no CV complaints. He is not dyspneic and his pain is well controlled Key examination changes: pale, jaundiced, puffy eyelids; irregular rhythm, rate controlled Key new findings / data: marked improvement in TBili, but still high at 5.0, corrected hyponatremia 129-135, stable Hgb 10.5. Ventricular rate in 100-110s this AM, but now 70-80s at rest  PLAN: Once his Port-A-Cath is completed, will switch his rate control and anticoagulant meds back to oral route.  Sanda Klein, MD, [place] [phone] 08/15/2020, 12:56 PM

## 2020-08-15 NOTE — Progress Notes (Addendum)
HEMATOLOGY-ONCOLOGY PROGRESS NOTE  SUBJECTIVE: The patient reports improvement in his abdominal pain today.  He currently denies nausea or vomiting.  He reports soreness in his mouth and throat.  Oncology History  Cancer of head of pancreas ([place])  07/13/2020 Initial Diagnosis   Cancer of head of pancreas ([place])    07/13/2020 Cancer Staging   Staging form: Exocrine Pancreas, AJCC 8th Edition - Clinical: Stage IV (cT2, cN1, cM1) - Signed by [name], MD on 07/13/2020    08/10/2020 -  Chemotherapy    Patient is on Treatment Plan: PANCREATIC ABRAXANE / GEMCITABINE D1,8,15 Q28D        PHYSICAL EXAMINATION:  Vitals:   08/14/20 1946 08/15/20 0459  BP: 139/79 (!) 148/96  Pulse: 89 92  Resp: 18 13  Temp: 98.4 F (36.9 C) 97.6 F (36.4 C)  SpO2: 96% 99%   Filed Weights   08/04/20 1608 08/14/20 1115  Weight: 73.5 kg 73.5 kg    Intake/Output from previous day: 08/08 0701 - 08/09 0700 In: 876.9 [P.O.:240; I.V.:319.2; IV Piggyback:317.7] Out: 1300 [Urine:1300]  GENERAL:alert, no distress and comfortable SKIN: skin color, texture, turgor are normal, no rashes or significant lesions EYES: Scleral icterus present OROPHARYNX: Thrush noted to left buccal mucosa ABDOMEN:abdomen soft, non-tender and normal bowel sounds NEURO: alert & oriented x 3 with fluent speech, no focal motor/sensory deficits  LABORATORY DATA:  I have reviewed the data as listed CMP Latest Ref Rng & Units 08/15/2020 08/14/2020 08/13/2020  Glucose 70 - 99 mg/dL 165(H) 116(H) 143(H)  BUN 8 - 23 mg/dL '13 12 20  '$ Creatinine 0.61 - 1.24 mg/dL 0.50(L) 0.46(L) 0.44(L)  Sodium 135 - 145 mmol/L 135 129(L) 128(L)  Potassium 3.5 - 5.1 mmol/L 4.1 3.7 3.9  Chloride 98 - 111 mmol/L 101 97(L) 101  CO2 22 - 32 mmol/L 24 23 21(L)  Calcium 8.9 - 10.3 mg/dL 8.5(L) 8.1(L) 8.2(L)  Total Protein 6.5 - 8.1 g/dL 6.0(L) 6.2(L) 5.9(L)  Total Bilirubin 0.3 - 1.2 mg/dL 5.0(H) 12.7(H) 9.9(H)  Alkaline Phos 38 - 126 U/L 546(H) 605(H)  590(H)  AST 15 - 41 U/L 63(H) 103(H) 121(H)  ALT 0 - 44 U/L 53(H) 69(H) 83(H)    Lab Results  Component Value Date   WBC 15.1 (H) 08/15/2020   HGB 10.5 (L) 08/15/2020   HCT 31.1 (L) 08/15/2020   MCV 90.7 08/15/2020   PLT 322 08/15/2020   NEUTROABS 12.9 (H) 08/15/2020    CT ABDOMEN PELVIS W CONTRAST  Result Date: 08/04/2020 CLINICAL DATA:  Provided history of: Cholecystitis (Ped 0-18y) possible biliary stent obstruction Technologist notes state recent diagnosis of pancreatic cancer with biliary stent placed last month. Abdominal pain, nausea, vomiting, and lethargy. EXAM: CT ABDOMEN AND PELVIS WITH CONTRAST TECHNIQUE: Multidetector CT imaging of the abdomen and pelvis was performed using the standard protocol following bolus administration of intravenous contrast. CONTRAST:  22m OMNIPAQUE IOHEXOL 350 MG/ML SOLN COMPARISON:  Abdominopelvic CT 05/29/2020, MRI 06/17/2020 FINDINGS: Lower chest: Small right pleural effusion. Bibasilar atelectasis. No basilar pulmonary nodule. Small hiatal hernia. Hepatobiliary: New ill-defined hypodensity in the periphery of the right lobe, series 2, image 14. Subtle low-density in the inferior right lobe series 2, image 33, at site of hypoenhancing lesion on MRI. Stable small subcapsular cyst in the right lobe, series 2, image 33. Biliary stent is in place with air and scattered fluid in the stent. No bile duct dilatation surround the stent. Resultant pneumobilia. There is no intrahepatic biliary ductal dilatation. Gallbladder is physiologically distended,  there may be minimal gallbladder wall enhancement and wall thickening. No calcified gallstone. Pancreas: Ill-defined low-density in the pancreatic head with margins difficult to delineate, measuring approximately 3.3 x 2.3 cm, previously 3.6 x 2.6 cm. Mild atrophy of the distal pancreas. No ductal dilatation. No peripancreatic fat stranding. Spleen: Punctate granuloma.  Normal in size.  Splenule anteriorly.  Adrenals/Urinary Tract: No adrenal nodule. Exophytic 4.7 cm cyst from the anterior right kidney. Smaller cyst in the lower right kidney. No hydronephrosis. No perinephric edema. There is symmetric renal excretion on delayed phase imaging. Minimally distended urinary bladder. Stomach/Bowel: Small to moderate hiatal hernia. Stomach is decompressed. There is no bowel obstruction or inflammation. Sigmoid diverticulosis without diverticulitis. Additional scattered diverticular seen in the more proximal colon. High-riding cecum in the right mid abdomen. Diminutive appendix tentatively visualized. No appendicitis. Vascular/Lymphatic: Aortic atherosclerosis and tortuosity. No aortic aneurysm. The portal, splenic, superior mesenteric veins are patent. Small peripancreatic nodes are not enlarged by size criteria. No enlarged lymph nodes in the abdomen or pelvis. Reproductive: Prostate is unremarkable. Other: Ascites. No free air or focal fluid collection. There is a fat containing umbilical hernia. Moderate to large fat containing left inguinal hernia, small fat containing right inguinal hernia. 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 Fusion of L1 through L3 vertebral bodies. Trabeculated lesion within T10 is likely a hemangioma. No acute osseous abnormalities are seen. IMPRESSION: 1. Biliary stent in place with air and scattered fluid in the stent. No biliary dilatation. 2. Known pancreatic head mass with margins difficult to delineate, measuring approximately 3.3 x 2.3 cm, previously 3.6 x 2.6 cm. 3. New liver lesion suspicious for metastasis, there is a subtle hypodensity in the right lobe at site of suspected metastasis on MRI. 4. Slight gallbladder wall enhancement and wall thickening, nonspecific in the setting. 5. Small right pleural effusion. 6. Colonic diverticulosis without diverticulitis. 7. Hiatal hernia. 8. Fat containing  umbilical and bilateral inguinal hernias. 9. 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 Aortic Atherosclerosis (ICD10-I70.0). Electronically Signed   By: Keith Rake M.D.   On: 08/04/2020 19:23   US Abdomen Limited  Result Date: 07/20/2020 CLINICAL DATA:  87 year old gentleman with pancreatic mass and possible small liver lesion. Liver lesion measures 1 cm or smaller in is very difficult to visualize on CT and MR. Patient presents for attempted ultrasound biopsy. EXAM: ULTRASOUND ABDOMEN LIMITED RIGHT UPPER QUADRANT COMPARISON:  MR abdomen 06/17/2020; CT scan of the abdomen 05/29/2020 FINDINGS: The liver was interrogated with ultrasound. Normal hepatic contour morphology. No discrete hepatic lesions identified. Correlation with prior cross-sectional imaging was obtained during real-time ultrasound evaluation of the liver. Special attention was paid to the inferior aspect of segment 6. No lesion is evident. Echogenic material present within the decompressed biliary tree diffusely consistent with pneumobilia. Patent portal and hepatic veins. IMPRESSION: 1. The small suspected lesion in the central aspect of hepatic segment 6 is not visible sonographically and therefore cannot be biopsied. Similarly, the lesion is very difficult to detect on both CT and MR imaging. 2. Recommend follow-up abdominal MRI in 3 months. If the lesion is enlarging, a repeat attempt at biopsy may be considered. 3. Expected pneumobilia status post ERCP with biliary stent placement. Electronically Signed   By: Jacqulynn Cadet M.D.   On: 07/20/2020 14:11   DG CHEST PORT 1 VIEW  Result Date: 08/09/2020 CLINICAL DATA:  Shortness of breath. EXAM: PORTABLE  CHEST 1 VIEW COMPARISON:  08/08/2020. FINDINGS: Cardiomegaly with bilateral interstitial prominence slightly progressed from prior exam. Small bilateral pleural effusions slightly progressed from prior exam. Findings  consistent with CHF. Pneumonitis cannot be excluded. No pneumothorax. IMPRESSION: Cardiomegaly with bilateral interstitial prominence slightly progressed from prior exam. Small bilateral pleural effusions slightly progressed from prior exam. Findings most consistent with CHF. Electronically Signed   By: Marcello Moores  Register   On: 08/09/2020 07:51   DG CHEST PORT 1 VIEW  Result Date: 08/08/2020 CLINICAL DATA:  Recent diagnosis of pancreatic cancer. Fever malaise. Nausea and vomiting. EXAM: PORTABLE CHEST 1 VIEW COMPARISON:  Chest x-ray 08/07/2020. FINDINGS: Cardiomegaly with bilateral interstitial prominence in small bilateral pleural effusions again noted. Findings again consistent with CHF. Pneumonitis cannot be excluded. No pneumothorax. IMPRESSION: Cardiomegaly with bilateral interstitial prominence and small bilateral pleural effusions again noted. Findings again consistent with CHF. Pneumonitis cannot be excluded. Similar findings noted on prior study of 08/07/2020. Electronically Signed   By: Marcello Moores  Register   On: 08/08/2020 08:53   DG Chest Port 1 View  Result Date: 08/07/2020 CLINICAL DATA:  Acute respiratory distress. EXAM: PORTABLE CHEST 1 VIEW COMPARISON:  Radiograph 08/04/2020. Lung bases from abdominal CT 08/04/2020. FINDINGS: Increasing hazy opacity at the lung bases consistent with increasing pleural effusions, right greater than left. There is septal thickening and alveolar opacities consistent with pulmonary edema. Mild cardiomegaly with equivocal increase. No pneumothorax or confluent airspace disease. Bilateral glenohumeral arthropathy degenerative change of the shoulders. IMPRESSION: Findings consistent with CHF, new from prior exam. Electronically Signed   By: Keith Rake M.D.   On: 08/07/2020 20:03   DG Chest Portable 1 View  Result Date: 08/04/2020 CLINICAL DATA:  Fatigue EXAM: PORTABLE CHEST 1 VIEW COMPARISON:  CT 07/21/2019 FINDINGS: Linear densities in the lung bases could  reflect scarring or atelectasis. Heart is normal size. No effusions or acute bony abnormality. IMPRESSION: Bibasilar atelectasis or scarring Electronically Signed   By: Rolm Baptise M.D.   On: 08/04/2020 16:55   DG ERCP  Result Date: 08/14/2020 CLINICAL DATA:  Biliary obstruction EXAM: ERCP TECHNIQUE: Multiple spot images obtained with the fluoroscopic device and submitted for interpretation post-procedure. COMPARISON:  08/11/2020 FINDINGS: Initial scout image demonstrates metallic biliary stent. Subsequent images document Skopic cannulation and opacification of the biliary tree with passage of balloon catheter. Final image documents placement of a second metallic stent within the first. There is incomplete visualization of the intrahepatic biliary tree which appears only mildly ectatic centrally. IMPRESSION: Endoscopic CBD cannulation and intervention as above. These images were submitted for radiologic interpretation only. Please see the procedural report for the amount of contrast and the fluoroscopy time utilized. Electronically Signed   By: Lucrezia Europe M.D.   On: 08/14/2020 14:40   DG ERCP BILIARY & PANCREATIC DUCTS  Result Date: 08/11/2020 CLINICAL DATA:  Biliary obstruction. EXAM: ERCP TECHNIQUE: Multiple spot images obtained with the fluoroscopic device and submitted for interpretation post-procedure. FLUOROSCOPY TIME:  Fluoroscopy Time:  3 minutes and 20 seconds. Radiation Exposure Index (if provided by the fluoroscopic device): Not reported Number of Acquired Spot Images: 5 COMPARISON:  CT Abdomen Pelvis, 08/04/2020.  ERCP, 07/04/2020 FINDINGS: Pre-existing metallic biliary stent. Multiple, limited oblique views of the right upper quadrant demonstrating endoscopy common bile duct cannulation and retrograde cholangiogram. No discrete filling defect demonstrated. IMPRESSION: Fluoroscopic imaging during ERCP and retrograde cholangiogram, as above. Please see dictated report for intraprocedural findings.  These images were submitted for radiologic interpretation only. Please see the procedural  report for the amount of contrast and the fluoroscopy dose. Electronically Signed   By: Michaelle Birks MD   On: 08/11/2020 13:10   ECHOCARDIOGRAM COMPLETE  Result Date: 08/08/2020    ECHOCARDIOGRAM REPORT   Patient Name:   Barry Contreras Date of Exam: 08/08/2020 Medical Rec #:  [id number]      Height:       62.0 in Accession #:    [id number]     Weight:       162.0 lb Date of Birth:  23-Aug-1931      BSA:          1.748 m Patient Age:    24 years       BP:           128/92 mmHg Patient Gender: M              HR:           76 bpm. Exam Location:  Inpatient Procedure: 2D Echo, Cardiac Doppler and Color Doppler Indications:    Dyspnea  History:        Patient has prior history of Echocardiogram examinations, most                 recent 12/12/2016. Arrythmias:Atrial Fibrillation,                 Signs/Symptoms:Murmur; Risk Factors:Hypertension.  Sonographer:    Luisa Hart RDCS Referring Phys: [id number] Georgina Quint LATIF Marcus  1. Left ventricular ejection fraction, by estimation, is 70 to 75%. The left ventricle has hyperdynamic function. The left ventricle has no regional wall motion abnormalities. Left ventricular diastolic function could not be evaluated.  2. Right ventricular systolic function is normal. The right ventricular size is normal. There is mildly elevated pulmonary artery systolic pressure.  3. Left atrial size was mildly dilated.  4. The mitral valve is normal in structure. Mild mitral valve regurgitation. No evidence of mitral stenosis.  5. The aortic valve is tricuspid. There is mild calcification of the aortic valve. There is mild thickening of the aortic valve. Aortic valve regurgitation is mild to moderate. Mild to moderate aortic valve sclerosis/calcification is present, without any evidence of aortic stenosis.  6. There is mild dilatation of the ascending aorta, measuring 39 mm.  7. The inferior vena cava  is normal in size with greater than 50% respiratory variability, suggesting right atrial pressure of 3 mmHg. Comparison(s): Prior images reviewed side by side. Degenerative aortic valve changes and the degree of aortic insufficiency is slightly worse. The ascending aorta is not seen as clearly on the current study and was previously measured as larger. FINDINGS  Left Ventricle: Left ventricular ejection fraction, by estimation, is 70 to 75%. The left ventricle has hyperdynamic function. The left ventricle has no regional wall motion abnormalities. The left ventricular internal cavity size was normal in size. There is no left ventricular hypertrophy. Left ventricular diastolic function could not be evaluated due to atrial fibrillation. Left ventricular diastolic function could not be evaluated. Right Ventricle: The right ventricular size is normal. No increase in right ventricular wall thickness. Right ventricular systolic function is normal. There is mildly elevated pulmonary artery systolic pressure. The tricuspid regurgitant velocity is 3.03  m/s, and with an assumed right atrial pressure of 3 mmHg, the estimated right ventricular systolic pressure is [id number]. Left Atrium: Left atrial size was mildly dilated. Right Atrium: Right atrial size was normal in size. Pericardium: Trivial pericardial effusion is present. Mitral  Valve: The mitral valve is normal in structure. Mild mitral valve regurgitation. No evidence of mitral valve stenosis. Tricuspid Valve: The tricuspid valve is normal in structure. Tricuspid valve regurgitation is mild . No evidence of tricuspid stenosis. Aortic Valve: The aortic valve is tricuspid. There is mild calcification of the aortic valve. There is mild thickening of the aortic valve. Aortic valve regurgitation is mild to moderate. Aortic regurgitation PHT measures 339 msec. Mild to moderate aortic valve sclerosis/calcification is present, without any evidence of aortic stenosis. Aortic  valve mean gradient measures 8.2 mmHg. Aortic valve peak gradient measures 15.7 mmHg. Aortic valve area, by VTI measures 1.99 cm. Pulmonic Valve: The pulmonic valve was normal in structure. Pulmonic valve regurgitation is trivial. No evidence of pulmonic stenosis. Aorta: The aortic root is normal in size and structure. There is mild dilatation of the ascending aorta, measuring 39 mm. Venous: The inferior vena cava is normal in size with greater than 50% respiratory variability, suggesting right atrial pressure of 3 mmHg. IAS/Shunts: No atrial level shunt detected by color flow Doppler.  LEFT VENTRICLE PLAX 2D LVIDd:         3.60 cm LVIDs:         1.80 cm LV PW:         1.40 cm LV IVS:        1.10 cm LVOT diam:     2.30 cm LV SV:         64 LV SV Index:   37 LVOT Area:     4.15 cm  LV Volumes (MOD) LV vol d, MOD A2C: 38.7 ml LV vol d, MOD A4C: 42.3 ml LV vol s, MOD A2C: 13.2 ml LV vol s, MOD A4C: 14.4 ml LV SV MOD A2C:     25.5 ml LV SV MOD A4C:     42.3 ml LV SV MOD BP:      26.8 ml RIGHT VENTRICLE RV Basal diam:  4.30 cm RV Mid diam:    2.40 cm RV S prime:     10.60 cm/s TAPSE (M-mode): 1.1 cm LEFT ATRIUM             Index       RIGHT ATRIUM           Index LA Vol (A2C):   37.3 ml 21.34 ml/m RA Area:     17.70 cm LA Vol (A4C):   62.3 ml 35.64 ml/m RA Volume:   45.70 ml  26.15 ml/m LA Biplane Vol: 51.2 ml 29.29 ml/m  AORTIC VALVE                    PULMONIC VALVE AV Area (Vmax):    2.18 cm     PV Vmax:       0.86 m/s AV Area (Vmean):   2.25 cm     PV Vmean:      57.300 cm/s AV Area (VTI):     1.99 cm     PV VTI:        0.155 m AV Vmax:           198.25 cm/s  PV Peak grad:  3.0 mmHg AV Vmean:          127.375 cm/s PV Mean grad:  2.0 mmHg AV VTI:            0.324 m AV Peak Grad:      15.7 mmHg AV Mean Grad:      8.2 mmHg LVOT  Vmax:         104.00 cm/s LVOT Vmean:        69.000 cm/s LVOT VTI:          0.155 m LVOT/AV VTI ratio: 0.48 AI PHT:            339 msec  AORTA Ao Root diam: 3.30 cm Ao Asc diam:  3.87  cm MR Peak grad:    62.1 mmHg   TRICUSPID VALVE MR Vmax:         394.00 cm/s TR Peak grad:   36.7 mmHg MR PISA:         1.01 cm    TR Vmax:        303.00 cm/s MR PISA Eff ROA: 6 mm MR PISA Radius:  0.40 cm     SHUNTS                              Systemic VTI:  0.16 m                              Systemic Diam: 2.30 cm Dani Gobble Croitoru MD Electronically signed by Sanda Klein MD Signature Date/Time: 08/08/2020/3:15:56 PM    Final    US Abdomen Limited RUQ (LIVER/GB)  Result Date: 08/04/2020 CLINICAL DATA:  Right upper quadrant pain EXAM: ULTRASOUND ABDOMEN LIMITED RIGHT UPPER QUADRANT COMPARISON:  CT earlier today FINDINGS: Gallbladder: Shadowing noted from the gallbladder fundus, likely due to gas within the gallbladder. Gallbladder wall is thickened at 7 mm. No visible stones. Common bile duct: Diameter: Metallic biliary stent noted. Unable to measure the common bile duct. Liver: Pneumobilia. No focal hepatic abnormality. Portal vein is patent on color Doppler imaging with normal direction of blood flow towards the liver. Other: None. IMPRESSION: Pneumobilia related to biliary stent. Gallbladder wall thickening.  No visible stones. Electronically Signed   By: Rolm Baptise M.D.   On: 08/04/2020 21:18    ASSESSMENT AND PLAN: Pancreas cancer, clinical stage IV (cT2,cN1,cM1) CT abdomen/pelvis 05/29/2020 pancreas head mass versus focal pancreatitis MRI abdomen 06/17/2020-hypoenhancing mass in the pancreas head, 3.7 x 3 cm, lesion abuts the anterior surface of the portal vein, subtle 1 cm hypoenhancing segment 6 lesion suspicious for metastasis, no lymphadenopathy, distended gallbladder containing sludge and probable tiny gallstones EUS 07/04/2020, 10 mm malignant appearing peripancreatic lymph node, 35 x 25 mm pancreas head mass, abutment of the portal vein and superior mesenteric vein, no invasion of the SMA or celiac trunk, upstream pancreatic duct dilation, FNA biopsy of the pancreas head  mass-adenocarcinoma Ultrasound liver 07/20/2020-liver lesion could not be seen, biopsy procedure aborted CT 08/04/2020-new peripheral right liver lesion, subtle low density lesion in the inferior right liver at the site of a hypoenhancing lesion on MRI, no biliary duct dilatation, pancreas head mass Obstructive jaundice secondary to #1-ERCP 07/04/2020-low to mid duct stricture, uncovered metal stent placed in the common bile duct Atrial fibrillation-maintained on apixaban anticoagulation Glaucoma Aortic aneurysm Admission 08/04/2020 with a fever and hyperbilirubinemia-blood cultures positive for Klebsiella  Mr. Paris appears improved.  His abdominal pain is improving and his bilirubin is trending downward following stent placement yesterday.  He remains on oral antibiotics.  Repeat blood cultures were drawn yesterday and results are currently pending.  Plan is to initiate systemic chemotherapy with gemcitabine and Abraxane once he has recovered from cholangitis.  He will need a Port-A-Cath placed anticipation  of chemotherapy.  I have placed an order for IR to evaluate for Port-A-Cath placement.  Will need to make sure repeat blood cultures are negative before port is placed.  Also, he is on IV heparin and recommend keeping him on IV heparin until after Port-A-Cath is placed before transitioning back to oral anticoagulation.  He is noted to have oral candidiasis on his left buccal mucosa and will initiate treatment.  Recommendations: 1.  Order placed for IR evaluation for Port-A-Cath placement if blood cultures remain negative. 2.  Continue antibiotics per primary team. 3.  Initiate nystatin swish and swallow 4 times a day. 4.  Continue IV heparin until after Port-A-Cath is placed and then can resume Eliquis.   LOS: 10 days   Mikey Bussing, DNP, AGPCNP-BC, AOCNP 08/15/20 Mr. Solis was interviewed and examined.  I reviewed the ERCP note from yesterday.  The bilirubin has improved following  placement of a new bile duct stent.  We will ask IR to evaluate him for Port-A-Cath placement as he has been maintained on antibiotics for greater than 10 days.  We will arrange for outpatient follow-up with the Cancer center once the Port-A-Cath is placed in the bilirubin is down to less than 2.  I was present for greater than 50% of today's visit.  I performed medical decision making.  Julieanne Manson, MD

## 2020-08-15 NOTE — Consult Note (Addendum)
Chief Complaint: Patient was seen in consultation today for port-a-cath placement for pancreatic cancer treatment    Referring Physician(s): Dr. Benay Spice, Oncology  Supervising Physician: Rolla Plate  Patient Status: [facility] - In-pt  History of Present Illness: Barry Contreras. is a 87 y.o. male with dx of pancreatic cancer 05/29/20. Obstructive jaundice secondary to ERCP with stent placement 07/04/20, Afib, glaucoma, and recent admission 08/04/20 for fever and hyperbilirubinemia, +BC. Request from oncology to place port-a-cath.   Past Medical History:  Diagnosis Date   Atrial fibrillation, permanent ([place])    Cancer ([place])    Diverticulitis    Family history of breast cancer 07/27/2020   Family history of colon cancer 07/27/2020   Hyperglycemia    Hypertension    Inguinal hernia    Macular degeneration    Mitral regurgitation    Umbilical hernia     Past Surgical History:  Procedure Laterality Date   BILIARY STENT PLACEMENT N/A 07/04/2020   Procedure: BILIARY STENT PLACEMENT;  Surgeon: Clarene Essex, MD;  Location: [facility];  Service: Endoscopy;  Laterality: N/A;   CATARACT EXTRACTION     ERCP N/A 07/04/2020   Procedure: ENDOSCOPIC RETROGRADE CHOLANGIOPANCREATOGRAPHY (ERCP);  Surgeon: Clarene Essex, MD;  Location: [facility];  Service: Endoscopy;  Laterality: N/A;   ERCP N/A 08/11/2020   Procedure: ENDOSCOPIC RETROGRADE CHOLANGIOPANCREATOGRAPHY (ERCP);  Surgeon: Clarene Essex, MD;  Location: [facility];  Service: Endoscopy;  Laterality: N/A;   EUS N/A 07/04/2020   Procedure: UPPER ENDOSCOPIC ULTRASOUND (EUS) RADIAL;  Surgeon: Clarene Essex, MD;  Location: [facility];  Service: Endoscopy;  Laterality: N/A;   FINE NEEDLE ASPIRATION  07/04/2020   Procedure: FINE NEEDLE ASPIRATION (FNA) LINEAR;  Surgeon: Clarene Essex, MD;  Location: [facility];  Service: Endoscopy;;   IRRIGATION AND DEBRIDEMENT SEBACEOUS CYST     REMOVAL OF STONES  08/11/2020   Procedure: REMOVAL OF STONES;   Surgeon: Clarene Essex, MD;  Location: [facility];  Service: Endoscopy;;   SPHINCTEROTOMY  07/04/2020   Procedure: Joan Mayans;  Surgeon: Clarene Essex, MD;  Location: [facility];  Service: Endoscopy;;    Allergies: Patient has no known allergies.  Medications: Prior to Admission medications   Medication Sig Start Date End Date Taking? Authorizing Provider  acetaminophen (TYLENOL) 650 MG CR tablet Take 1,300 mg by mouth every 8 (eight) hours as needed for pain.   Yes [provider]  apixaban (ELIQUIS) 5 MG TABS tablet Take 5 mg by mouth 2 (two) times daily.   Yes [provider]  Apoaequorin (PREVAGEN PO) Take 1 tablet by mouth daily.   Yes [provider]  azelastine (ASTELIN) 0.1 % nasal spray Place 1 spray into both nostrils daily as needed for rhinitis (To keep ears open).   Yes [provider]  benazepril-hydrochlorthiazide (LOTENSIN HCT) 20-12.5 MG per tablet Take 1 tablet by mouth daily.     Yes [provider]  CARDIZEM LA 120 MG 24 hr tablet Take 120 mg by mouth daily.  06/17/19  Yes [provider]  dorzolamide-timolol (COSOPT) 22.3-6.8 MG/ML ophthalmic solution Place 2 drops into both eyes 2 (two) times daily.   Yes [provider]  ketotifen (ZADITOR) 0.025 % ophthalmic solution Place 1 drop into both eyes daily as needed (Clear and dry eyes).   Yes [provider]  Lysine 1000 MG TABS Take 1,000 mg by mouth daily.   Yes [provider]  metoprolol (LOPRESSOR) 50 MG tablet Take 50 mg by mouth 2 (two) times daily.  Yes [provider]  Multiple Vitamin (MULTIVITAMIN) tablet Take 1 tablet by mouth daily.     Yes [provider]  Multiple Vitamins-Minerals (VISION-VITE PRESERVE PO) Take 1 tablet by mouth 2 (two) times daily.   Yes [provider]  Saw Palmetto 450 MG CAPS Take 450 mg by mouth 2 (two) times daily.   Yes [provider]  traMADol (ULTRAM) 50 MG  tablet Take 0.5-1 tablets (25-50 mg total) by mouth every 6 (six) hours as needed. Patient taking differently: Take 50 mg by mouth every 6 (six) hours as needed for severe pain. 07/24/20  Yes [name], MD  lidocaine-prilocaine (EMLA) cream Apply 1 application topically as directed. Apply 1/2 tablespoon to port 2 hours prior to stick and cover with plastic wrap to numb site 07/26/20   [name], MD  ondansetron (ZOFRAN) 8 MG tablet Take 1 tablet (8 mg total) by mouth every 8 (eight) hours as needed for nausea or vomiting. 07/26/20   [name], MD  prochlorperazine (COMPAZINE) 10 MG tablet Take 1 tablet (10 mg total) by mouth every 6 (six) hours as needed for nausea. 07/26/20   [name], MD     Family History  Problem Relation Age of Onset   Hypertension Mother    Stroke Mother    Heart attack Mother    Hypertension Father    Alzheimer's disease Father    Pneumonia Father    Stroke Sister    Alzheimer's disease Sister    Congenital heart disease Brother    Breast cancer Paternal Aunt        dx after 84   Lymphoma Paternal Grandfather    Breast cancer Niece        dx 15s   Colon cancer Nephew        d. 37s    Social History   Socioeconomic History   Marital status: Married    Spouse name: Not on file   Number of children: Not on file   Years of education: Not on file   Highest education level: Not on file  Occupational History   Not on file  Tobacco Use   Smoking status: Former    Years: 10.00    Types: Cigarettes    Quit date: 01/08/1968    Years since quitting: 52.6   Smokeless tobacco: Never  Vaping Use   Vaping Use: Never used  Substance and Sexual Activity   Alcohol use: Yes    Comment: rarely   Drug use: No   Sexual activity: Not on file  Other Topics Concern   Not on file  Social History Narrative   Not on file   Social Determinants of Health   Financial Resource Strain: Not on file  Food Insecurity: Not on file  Transportation  Needs: Not on file  Physical Activity: Not on file  Stress: Not on file  Social Connections: Not on file      Review of Systems  Constitutional:  Negative for chills and fever.  Respiratory:  Positive for shortness of breath.        Reports intermittent SOB that is his normal baseline  Cardiovascular:  Negative for chest pain.  Gastrointestinal:  Negative for abdominal distention and abdominal pain.   Vital Signs: BP (!) 130/95 (BP Location: Right Arm)   Pulse 97   Temp 97.8 F (36.6 C) (Oral)   Resp 16   Ht '5\' 2"'$  (1.575 m)   Wt 162 lb  0.6 oz (73.5 kg)   SpO2 95%   BMI 29.64 kg/m   Physical Exam Constitutional:      Appearance: Normal appearance.  HENT:     Mouth/Throat:     Mouth: Mucous membranes are moist.     Pharynx: Oropharynx is clear.  Cardiovascular:     Rate and Rhythm: Normal rate. Rhythm irregular.  Pulmonary:     Effort: Pulmonary effort is normal.  Abdominal:     Palpations: Abdomen is soft.     Tenderness: There is abdominal tenderness. There is no guarding.     Comments: Mild tenderness on palpation to RLQ  Skin:    General: Skin is warm and dry.  Neurological:     Mental Status: He is alert and oriented to person, place, and time.  Psychiatric:        Mood and Affect: Mood normal.        Behavior: Behavior normal.        Thought Content: Thought content normal.        Judgment: Judgment normal.    Imaging: CT ABDOMEN PELVIS W CONTRAST  Result Date: 08/04/2020 CLINICAL DATA:  Provided history of: Cholecystitis (Ped 0-18y) possible biliary stent obstruction Technologist notes state recent diagnosis of pancreatic cancer with biliary stent placed last month. Abdominal pain, nausea, vomiting, and lethargy. EXAM: CT ABDOMEN AND PELVIS WITH CONTRAST TECHNIQUE: Multidetector CT imaging of the abdomen and pelvis was performed using the standard protocol following bolus administration of intravenous contrast. CONTRAST:  33m OMNIPAQUE IOHEXOL 350 MG/ML  SOLN COMPARISON:  Abdominopelvic CT 05/29/2020, MRI 06/17/2020 FINDINGS: Lower chest: Small right pleural effusion. Bibasilar atelectasis. No basilar pulmonary nodule. Small hiatal hernia. Hepatobiliary: New ill-defined hypodensity in the periphery of the right lobe, series 2, image 14. Subtle low-density in the inferior right lobe series 2, image 33, at site of hypoenhancing lesion on MRI. Stable small subcapsular cyst in the right lobe, series 2, image 33. Biliary stent is in place with air and scattered fluid in the stent. No bile duct dilatation surround the stent. Resultant pneumobilia. There is no intrahepatic biliary ductal dilatation. Gallbladder is physiologically distended, there may be minimal gallbladder wall enhancement and wall thickening. No calcified gallstone. Pancreas: Ill-defined low-density in the pancreatic head with margins difficult to delineate, measuring approximately 3.3 x 2.3 cm, previously 3.6 x 2.6 cm. Mild atrophy of the distal pancreas. No ductal dilatation. No peripancreatic fat stranding. Spleen: Punctate granuloma.  Normal in size.  Splenule anteriorly. Adrenals/Urinary Tract: No adrenal nodule. Exophytic 4.7 cm cyst from the anterior right kidney. Smaller cyst in the lower right kidney. No hydronephrosis. No perinephric edema. There is symmetric renal excretion on delayed phase imaging. Minimally distended urinary bladder. Stomach/Bowel: Small to moderate hiatal hernia. Stomach is decompressed. There is no bowel obstruction or inflammation. Sigmoid diverticulosis without diverticulitis. Additional scattered diverticular seen in the more proximal colon. High-riding cecum in the right mid abdomen. Diminutive appendix tentatively visualized. No appendicitis. Vascular/Lymphatic: Aortic atherosclerosis and tortuosity. No aortic aneurysm. The portal, splenic, superior mesenteric veins are patent. Small peripancreatic nodes are not enlarged by size criteria. No enlarged lymph nodes in the  abdomen or pelvis. Reproductive: Prostate is unremarkable. Other: Ascites. No free air or focal fluid collection. There is a fat containing umbilical hernia. Moderate to large fat containing left inguinal hernia, small fat containing right inguinal hernia. 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 Fusion of L1 through L3 vertebral bodies. Trabeculated  lesion within T10 is likely a hemangioma. No acute osseous abnormalities are seen. IMPRESSION: 1. Biliary stent in place with air and scattered fluid in the stent. No biliary dilatation. 2. Known pancreatic head mass with margins difficult to delineate, measuring approximately 3.3 x 2.3 cm, previously 3.6 x 2.6 cm. 3. New liver lesion suspicious for metastasis, there is a subtle hypodensity in the right lobe at site of suspected metastasis on MRI. 4. Slight gallbladder wall enhancement and wall thickening, nonspecific in the setting. 5. Small right pleural effusion. 6. Colonic diverticulosis without diverticulitis. 7. Hiatal hernia. 8. Fat containing umbilical and bilateral inguinal hernias. 9. 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 Aortic Atherosclerosis (ICD10-I70.0). Electronically Signed   By: Keith Rake M.D.   On: 08/04/2020 19:23   US Abdomen Limited  Result Date: 07/20/2020 CLINICAL DATA:  87 year old gentleman with pancreatic mass and possible small liver lesion. Liver lesion measures 1 cm or smaller in is very difficult to visualize on CT and MR. Patient presents for attempted ultrasound biopsy. EXAM: ULTRASOUND ABDOMEN LIMITED RIGHT UPPER QUADRANT COMPARISON:  MR abdomen 06/17/2020; CT scan of the abdomen 05/29/2020 FINDINGS: The liver was interrogated with ultrasound. Normal hepatic contour morphology. No discrete hepatic lesions identified. Correlation with prior cross-sectional imaging was  obtained during real-time ultrasound evaluation of the liver. Special attention was paid to the inferior aspect of segment 6. No lesion is evident. Echogenic material present within the decompressed biliary tree diffusely consistent with pneumobilia. Patent portal and hepatic veins. IMPRESSION: 1. The small suspected lesion in the central aspect of hepatic segment 6 is not visible sonographically and therefore cannot be biopsied. Similarly, the lesion is very difficult to detect on both CT and MR imaging. 2. Recommend follow-up abdominal MRI in 3 months. If the lesion is enlarging, a repeat attempt at biopsy may be considered. 3. Expected pneumobilia status post ERCP with biliary stent placement. Electronically Signed   By: Jacqulynn Cadet M.D.   On: 07/20/2020 14:11   DG CHEST PORT 1 VIEW  Result Date: 08/09/2020 CLINICAL DATA:  Shortness of breath. EXAM: PORTABLE CHEST 1 VIEW COMPARISON:  08/08/2020. FINDINGS: Cardiomegaly with bilateral interstitial prominence slightly progressed from prior exam. Small bilateral pleural effusions slightly progressed from prior exam. Findings consistent with CHF. Pneumonitis cannot be excluded. No pneumothorax. IMPRESSION: Cardiomegaly with bilateral interstitial prominence slightly progressed from prior exam. Small bilateral pleural effusions slightly progressed from prior exam. Findings most consistent with CHF. Electronically Signed   By: Marcello Moores  Register   On: 08/09/2020 07:51   DG CHEST PORT 1 VIEW  Result Date: 08/08/2020 CLINICAL DATA:  Recent diagnosis of pancreatic cancer. Fever malaise. Nausea and vomiting. EXAM: PORTABLE CHEST 1 VIEW COMPARISON:  Chest x-ray 08/07/2020. FINDINGS: Cardiomegaly with bilateral interstitial prominence in small bilateral pleural effusions again noted. Findings again consistent with CHF. Pneumonitis cannot be excluded. No pneumothorax. IMPRESSION: Cardiomegaly with bilateral interstitial prominence and small bilateral pleural  effusions again noted. Findings again consistent with CHF. Pneumonitis cannot be excluded. Similar findings noted on prior study of 08/07/2020. Electronically Signed   By: Marcello Moores  Register   On: 08/08/2020 08:53   DG Chest Port 1 View  Result Date: 08/07/2020 CLINICAL DATA:  Acute respiratory distress. EXAM: PORTABLE CHEST 1 VIEW COMPARISON:  Radiograph 08/04/2020. Lung bases from abdominal CT 08/04/2020. FINDINGS: Increasing hazy opacity at the lung bases consistent with increasing pleural effusions, right greater than left. There is septal thickening and alveolar opacities consistent  with pulmonary edema. Mild cardiomegaly with equivocal increase. No pneumothorax or confluent airspace disease. Bilateral glenohumeral arthropathy degenerative change of the shoulders. IMPRESSION: Findings consistent with CHF, new from prior exam. Electronically Signed   By: Keith Rake M.D.   On: 08/07/2020 20:03   DG Chest Portable 1 View  Result Date: 08/04/2020 CLINICAL DATA:  Fatigue EXAM: PORTABLE CHEST 1 VIEW COMPARISON:  CT 07/21/2019 FINDINGS: Linear densities in the lung bases could reflect scarring or atelectasis. Heart is normal size. No effusions or acute bony abnormality. IMPRESSION: Bibasilar atelectasis or scarring Electronically Signed   By: Rolm Baptise M.D.   On: 08/04/2020 16:55   DG ERCP  Result Date: 08/14/2020 CLINICAL DATA:  Biliary obstruction EXAM: ERCP TECHNIQUE: Multiple spot images obtained with the fluoroscopic device and submitted for interpretation post-procedure. COMPARISON:  08/11/2020 FINDINGS: Initial scout image demonstrates metallic biliary stent. Subsequent images document Skopic cannulation and opacification of the biliary tree with passage of balloon catheter. Final image documents placement of a second metallic stent within the first. There is incomplete visualization of the intrahepatic biliary tree which appears only mildly ectatic centrally. IMPRESSION: Endoscopic CBD  cannulation and intervention as above. These images were submitted for radiologic interpretation only. Please see the procedural report for the amount of contrast and the fluoroscopy time utilized. Electronically Signed   By: Lucrezia Europe M.D.   On: 08/14/2020 14:40   DG ERCP BILIARY & PANCREATIC DUCTS  Result Date: 08/11/2020 CLINICAL DATA:  Biliary obstruction. EXAM: ERCP TECHNIQUE: Multiple spot images obtained with the fluoroscopic device and submitted for interpretation post-procedure. FLUOROSCOPY TIME:  Fluoroscopy Time:  3 minutes and 20 seconds. Radiation Exposure Index (if provided by the fluoroscopic device): Not reported Number of Acquired Spot Images: 5 COMPARISON:  CT Abdomen Pelvis, 08/04/2020.  ERCP, 07/04/2020 FINDINGS: Pre-existing metallic biliary stent. Multiple, limited oblique views of the right upper quadrant demonstrating endoscopy common bile duct cannulation and retrograde cholangiogram. No discrete filling defect demonstrated. IMPRESSION: Fluoroscopic imaging during ERCP and retrograde cholangiogram, as above. Please see dictated report for intraprocedural findings. These images were submitted for radiologic interpretation only. Please see the procedural report for the amount of contrast and the fluoroscopy dose. Electronically Signed   By: Michaelle Birks MD   On: 08/11/2020 13:10   ECHOCARDIOGRAM COMPLETE  Result Date: 08/08/2020    ECHOCARDIOGRAM REPORT   Patient Name:   HOOPER GRIESEL Date of Exam: 08/08/2020 Medical Rec #:  [id number]      Height:       62.0 in Accession #:    [id number]     Weight:       162.0 lb Date of Birth:  [birth year]      BSA:          1.748 m Patient Age:    29 years       BP:           128/92 mmHg Patient Gender: M              HR:           76 bpm. Exam Location:  Inpatient Procedure: 2D Echo, Cardiac Doppler and Color Doppler Indications:    Dyspnea  History:        Patient has prior history of Echocardiogram examinations, most                 recent  12/12/2016. Arrythmias:Atrial Fibrillation,  Signs/Symptoms:Murmur; Risk Factors:Hypertension.  Sonographer:    Luisa Hart RDCS Referring Phys: [id number] Georgina Quint LATIF [place]  1. Left ventricular ejection fraction, by estimation, is 70 to 75%. The left ventricle has hyperdynamic function. The left ventricle has no regional wall motion abnormalities. Left ventricular diastolic function could not be evaluated.  2. Right ventricular systolic function is normal. The right ventricular size is normal. There is mildly elevated pulmonary artery systolic pressure.  3. Left atrial size was mildly dilated.  4. The mitral valve is normal in structure. Mild mitral valve regurgitation. No evidence of mitral stenosis.  5. The aortic valve is tricuspid. There is mild calcification of the aortic valve. There is mild thickening of the aortic valve. Aortic valve regurgitation is mild to moderate. Mild to moderate aortic valve sclerosis/calcification is present, without any evidence of aortic stenosis.  6. There is mild dilatation of the ascending aorta, measuring 39 mm.  7. The inferior vena cava is normal in size with greater than 50% respiratory variability, suggesting right atrial pressure of 3 mmHg. Comparison(s): Prior images reviewed side by side. Degenerative aortic valve changes and the degree of aortic insufficiency is slightly worse. The ascending aorta is not seen as clearly on the current study and was previously measured as larger. FINDINGS  Left Ventricle: Left ventricular ejection fraction, by estimation, is 70 to 75%. The left ventricle has hyperdynamic function. The left ventricle has no regional wall motion abnormalities. The left ventricular internal cavity size was normal in size. There is no left ventricular hypertrophy. Left ventricular diastolic function could not be evaluated due to atrial fibrillation. Left ventricular diastolic function could not be evaluated. Right Ventricle: The  right ventricular size is normal. No increase in right ventricular wall thickness. Right ventricular systolic function is normal. There is mildly elevated pulmonary artery systolic pressure. The tricuspid regurgitant velocity is 3.03  m/s, and with an assumed right atrial pressure of 3 mmHg, the estimated right ventricular systolic pressure is [id number]. Left Atrium: Left atrial size was mildly dilated. Right Atrium: Right atrial size was normal in size. Pericardium: Trivial pericardial effusion is present. Mitral Valve: The mitral valve is normal in structure. Mild mitral valve regurgitation. No evidence of mitral valve stenosis. Tricuspid Valve: The tricuspid valve is normal in structure. Tricuspid valve regurgitation is mild . No evidence of tricuspid stenosis. Aortic Valve: The aortic valve is tricuspid. There is mild calcification of the aortic valve. There is mild thickening of the aortic valve. Aortic valve regurgitation is mild to moderate. Aortic regurgitation PHT measures 339 msec. Mild to moderate aortic valve sclerosis/calcification is present, without any evidence of aortic stenosis. Aortic valve mean gradient measures 8.2 mmHg. Aortic valve peak gradient measures 15.7 mmHg. Aortic valve area, by VTI measures 1.99 cm. Pulmonic Valve: The pulmonic valve was normal in structure. Pulmonic valve regurgitation is trivial. No evidence of pulmonic stenosis. Aorta: The aortic root is normal in size and structure. There is mild dilatation of the ascending aorta, measuring 39 mm. Venous: The inferior vena cava is normal in size with greater than 50% respiratory variability, suggesting right atrial pressure of 3 mmHg. IAS/Shunts: No atrial level shunt detected by color flow Doppler.  LEFT VENTRICLE PLAX 2D LVIDd:         3.60 cm LVIDs:         1.80 cm LV PW:         1.40 cm LV IVS:        1.10 cm LVOT diam:  2.30 cm LV SV:         64 LV SV Index:   37 LVOT Area:     4.15 cm  LV Volumes (MOD) LV vol d, MOD  A2C: 38.7 ml LV vol d, MOD A4C: 42.3 ml LV vol s, MOD A2C: 13.2 ml LV vol s, MOD A4C: 14.4 ml LV SV MOD A2C:     25.5 ml LV SV MOD A4C:     42.3 ml LV SV MOD BP:      26.8 ml RIGHT VENTRICLE RV Basal diam:  4.30 cm RV Mid diam:    2.40 cm RV S prime:     10.60 cm/s TAPSE (M-mode): 1.1 cm LEFT ATRIUM             Index       RIGHT ATRIUM           Index LA Vol (A2C):   37.3 ml 21.34 ml/m RA Area:     17.70 cm LA Vol (A4C):   62.3 ml 35.64 ml/m RA Volume:   45.70 ml  26.15 ml/m LA Biplane Vol: 51.2 ml 29.29 ml/m  AORTIC VALVE                    PULMONIC VALVE AV Area (Vmax):    2.18 cm     PV Vmax:       0.86 m/s AV Area (Vmean):   2.25 cm     PV Vmean:      57.300 cm/s AV Area (VTI):     1.99 cm     PV VTI:        0.155 m AV Vmax:           198.25 cm/s  PV Peak grad:  3.0 mmHg AV Vmean:          127.375 cm/s PV Mean grad:  2.0 mmHg AV VTI:            0.324 m AV Peak Grad:      15.7 mmHg AV Mean Grad:      8.2 mmHg LVOT Vmax:         104.00 cm/s LVOT Vmean:        69.000 cm/s LVOT VTI:          0.155 m LVOT/AV VTI ratio: 0.48 AI PHT:            339 msec  AORTA Ao Root diam: 3.30 cm Ao Asc diam:  3.87 cm MR Peak grad:    62.1 mmHg   TRICUSPID VALVE MR Vmax:         394.00 cm/s TR Peak grad:   36.7 mmHg MR PISA:         1.01 cm    TR Vmax:        303.00 cm/s MR PISA Eff ROA: 6 mm MR PISA Radius:  0.40 cm     SHUNTS                              Systemic VTI:  0.16 m                              Systemic Diam: 2.30 cm Dani Gobble Croitoru MD Electronically signed by Sanda Klein MD Signature Date/Time: 08/08/2020/3:15:56 PM    Final    US Abdomen Limited RUQ (LIVER/GB)  Result Date: 08/04/2020 CLINICAL  DATA:  Right upper quadrant pain EXAM: ULTRASOUND ABDOMEN LIMITED RIGHT UPPER QUADRANT COMPARISON:  CT earlier today FINDINGS: Gallbladder: Shadowing noted from the gallbladder fundus, likely due to gas within the gallbladder. Gallbladder wall is thickened at 7 mm. No visible stones. Common bile duct: Diameter:  Metallic biliary stent noted. Unable to measure the common bile duct. Liver: Pneumobilia. No focal hepatic abnormality. Portal vein is patent on color Doppler imaging with normal direction of blood flow towards the liver. Other: None. IMPRESSION: Pneumobilia related to biliary stent. Gallbladder wall thickening.  No visible stones. Electronically Signed   By: Rolm Baptise M.D.   On: 08/04/2020 21:18    Labs:  CBC: Recent Labs    08/09/20 0247 08/13/20 0304 08/14/20 0437 08/15/20 0106  WBC 10.8* 20.4* 18.1* 15.1*  HGB 11.6* 10.7* 10.9* 10.5*  HCT 35.6* 32.9* 32.0* 31.1*  PLT 318 336 326 322    COAGS: Recent Labs    07/20/20 1150 08/04/20 1858 08/10/20 0838 08/10/20 1848 08/11/20 0405 08/11/20 2336  INR 1.1 1.4*  --   --   --   --   APTT 35 38* 31 47* 65* 77*    BMP: Recent Labs    08/12/20 0311 08/13/20 0304 08/14/20 0437 08/15/20 0106  NA 129* 128* 129* 135  K 3.9 3.9 3.7 4.1  CL 99 101 97* 101  CO2 22 21* 23 24  GLUCOSE 193* 143* 116* 165*  BUN '16 20 12 13  '$ CALCIUM 8.5* 8.2* 8.1* 8.5*  CREATININE 0.55* 0.44* 0.46* 0.50*  GFRNONAA >60 >60 >60 >60    LIVER FUNCTION TESTS: Recent Labs    08/12/20 0311 08/13/20 0304 08/14/20 0437 08/15/20 0106  BILITOT 8.5* 9.9* 12.7* 5.0*  AST 94* 121* 103* 63*  ALT 78* 83* 69* 53*  ALKPHOS 623* 590* 605* 546*  PROT 6.1* 5.9* 6.2* 6.0*  ALBUMIN 2.8* 2.6* 2.5* 2.4*    TUMOR MARKERS: No results for input(s): AFPTM, CEA, CA199, CHROMGRNA in the last 8760 hours.  Assessment and Plan: Redd Rende. is a 87 y.o. male with dx of pancreatic cancer 05/29/20. Obstructive jaundice secondary to ERCP with stent placement 07/04/20, Afib, glaucoma, and recent admission 08/04/20 for fever and hyperbilirubinemia and +BC. Request from oncology to place port-a-cath.  WBC 15.1 from 18.1- 8/8 Hgb 10.5- 8/8 Tbili 5.0 from 12.7- 8/8 AST 63 from 103- 8/8 ALT 53 from 69- 8/8 ALKPHOS 546 from 605- 8/8 BC pending  Plan to place  port-a-cath 08/16/20 if pending BC result negative. Pt and family are aware that if [facility] are +, the procedure may be delayed until bacteremia is tx. Risks and benefits of image guided port-a-catheter placement was discussed with the patient and family including, but not limited to bleeding, infection, pneumothorax, or fibrin sheath development and need for additional procedures.  All of the patient's questions were answered, patient is agreeable to proceed. Consent signed and in chart.    Thank you for this interesting consult.  I greatly enjoyed meeting Leng Quispe. and look forward to participating in their care.  A copy of this report was sent to the requesting provider on this date.  Electronically Signed: Tyson Alias, NP 08/15/2020, 2:44 PM   I spent a total of 20 minutes in face to face in clinical consultation, greater than 50% of which was counseling/coordinating care for image guided port-a-cath placement.

## 2020-08-15 NOTE — Care Management Important Message (Signed)
Important Message  Patient Details IM Letter placed in Patient's room. Name: Barry Contreras. MRN: [id number] Date of Birth: 10-17-1931   Medicare Important Message Given:  Yes     Kerin Salen 08/15/2020, 11:11 AM

## 2020-08-15 NOTE — Progress Notes (Signed)
[facility] Gastroenterology Progress Note  Barry Contreras. 87 y.o. 1931/12/13  CC: Obstructive jaundice   Subjective: Patient seen and examined at bedside.   Feeling somewhat better.  No acute issues noted  ROS : Afebrile, negative for chest pain   Objective: Vital signs in last 24 hours: Vitals:   08/14/20 1946 08/15/20 0459  BP: 139/79 (!) 148/96  Pulse: 89 92  Resp: 18 13  Temp: 98.4 F (36.9 C) 97.6 F (36.4 C)  SpO2: 96% 99%    Physical Exam:  General:  Alert, cooperative, no distress, appears stated age  Head:  Normocephalic, without obvious abnormality, atraumatic  Eyes:  , Scleral icterus  Lungs:   No visible respiratory distress  Heart:  Irregular rate and rhythm  Abdomen:   Soft, non-tender, bowel sounds active all four quadrants,  no masses,   Neuro Alert and oriented times 3  Psych Mood and affect normal    Lab Results: Recent Labs    08/14/20 0437 08/15/20 0106  NA 129* 135  K 3.7 4.1  CL 97* 101  CO2 23 24  GLUCOSE 116* 165*  BUN 12 13  CREATININE 0.46* 0.50*  CALCIUM 8.1* 8.5*  MG 2.0 2.2   Recent Labs    08/14/20 0437 08/15/20 0106  AST 103* 63*  ALT 69* 53*  ALKPHOS 605* 546*  BILITOT 12.7* 5.0*  PROT 6.2* 6.0*  ALBUMIN 2.5* 2.4*   Recent Labs    08/14/20 0437 08/15/20 0106  WBC 18.1* 15.1*  NEUTROABS 15.3* 12.9*  HGB 10.9* 10.5*  HCT 32.0* 31.1*  MCV 89.6 90.7  PLT 326 322   No results for input(s): LABPROT, INR in the last 72 hours.    Assessment/Plan: -Metastatic pancreatic cancer with obstructive jaundice.  Underwent initial ERCP on July 04, 2020 with 10 French by 6 cm uncovered stent placement.  His bilirubin was down to 1.3 as of August 07, 2020 but now having worsening jaundice.    Underwent repeat ERCP 08/05 which showed possible tumor growth around the stent as well as minimal ingrowth of the tumor inside the stent.  Balloon pull-through with 9 to 10 mm balloon was performed  History of atrial fibrillation.  On  anticoagulation  Recommendation ---------------------------- -S/p ERCP with stent in stent placement yesterday.  Total bilirubin improved to 5 today -Okay to switch to oral anticoagulation from GI standpoint -Okay to start soft diet from GI standpoint -GI will sign off.  Call us back if needed   Otis Brace MD, FACP 08/15/2020, 10:29 AM  Contact #  [phone]

## 2020-08-15 NOTE — Progress Notes (Signed)
ANTICOAGULATION CONSULT NOTE   Pharmacy Consult for heparin Indication: atrial fibrillation  No Known Allergies  Patient Measurements: Height: '5\' 2"'$  (157.5 cm) Weight: 73.5 kg (162 lb 0.6 oz) IBW/kg (Calculated) : 54.6 Heparin Dosing Weight: 73 kg  Vital Signs: Temp: 98.4 F (36.9 C) (08/08 1946) Temp Source: Oral (08/08 1946) BP: 139/79 (08/08 1946) Pulse Rate: 89 (08/08 1946)  Labs: Recent Labs    08/13/20 0304 08/13/20 1132 08/13/20 2049 08/14/20 0437 08/15/20 0106  HGB 10.7*  --   --  10.9* 10.5*  HCT 32.9*  --   --  32.0* 31.1*  PLT 336  --   --  326 322  HEPARINUNFRC 0.23*   < > 0.44 0.47 0.44  CREATININE 0.44*  --   --  0.46* 0.50*   < > = values in this interval not displayed.     Estimated Creatinine Clearance: 58.3 mL/min (A) (by C-G formula based on SCr of 0.5 mg/dL (L)).   Medications:  - on Eliquis '5mg'$  bid PTA  Assessment: Patient is an 87 y.o M with hx pancreatic cancer with obstructive juandice (s/p bilary stent placement on 07/04/20) and afib on Eliquis PTA presented to the ED on 7/29 with fever and generalized weakness.  He was found to have elevated LFTs  with signs of cholangitis and underwent ERCP on 8/5 (noted to have one partially occluded stent).  He's currently on heparin drip while Eliquis is on hold.  Today, 08/15/2020: - AM heparin level therapeutic at 0.44 on drip infusing at 1600 units/hr -- s/p repeat ERCP on 8/8.  - CBC stable - no bleeding documented  Goal of Therapy:  Heparin level 0.3-0.7 units/ml Monitor platelets by anticoagulation protocol: Yes   Plan:  -  Continue heparin gtt @ 1600 units/hr - Daily heparin level/CBC  Leone Haven, PharmD 08/15/2020 2:25 AM

## 2020-08-15 NOTE — Progress Notes (Signed)
Chaplain helped escort Barry Contreras to [facility]' room.  Chaplain continued to offer support and presence to Barry Contreras.  She stated that Barry Contreras husband was doing.  Barry Contreras was thankful for chaplain checking on them.  Chaplain is available to follow-up as needed.    08/15/20 1300  Clinical Encounter Type  Visited With Family;Patient  Visit Type Follow-up;Social support

## 2020-08-15 NOTE — Evaluation (Signed)
Physical Therapy Evaluation Patient Details Name: Barry Contreras. MRN: [id number] DOB: [birth year] Today's Date: 08/15/2020   History of Present Illness  The patient is an 87 year old Caucasian male with a past medical history significant for but not limited to pancreatic cancer with recent systemic chemotherapy, history of obstructive jaundice status post biliary stent placement, proximal atrial fibrillation on anticoagulant with Eliquis, hypertension, prediabetes as well as other comorbidities who presented 08/04/20  with a chief complaint of fever, nausea, vomiting.Patient also developed A. fib with RVR.  Cardiology was consulted and he ultimately required initiation of a Cardizem drip.  Due to uptrending LFTs, he underwent ERCP on 08/11/20 as well (see below). LFTs continued to trend up post 1st ERCP and plan is for repeat ERCP on 08/14/20.  Clinical Impression  The patient reports feeling weak but knows that he should get up.  Patient supervision for bed mobility. Patient stood at [employer], min Assist to step to recliner, using RW. Patient performed standing march in place x 10, seated long kicks x 10.  Patient expressed feeling better and pleased to be OOB.   Patient will need to be modified independent for ADL's and ambulation as wife relies on rollator.  Patient was independent PTA. Pt admitted with above diagnosis.  Pt currently with functional limitations due to the deficits listed below (see PT Problem List). Pt will benefit from skilled PT to increase their independence and safety with mobility to allow discharge to the venue listed below.  HR 96, BP 118/78 after transfer to recliner.     Follow Up Recommendations Home health PT    Equipment Recommendations  None recommended by PT    Recommendations for Other Services       Precautions / Restrictions Precautions Precautions: Fall      Mobility  Bed Mobility Overal bed mobility: Needs Assistance Bed Mobility: Supine to Sit      Supine to sit: Supervision;HOB elevated          Transfers Overall transfer level: Needs assistance Equipment used: Rolling walker (2 wheeled) Transfers: Sit to/from [facility] Sit to Stand: Min assist Stand pivot transfers: Min assist       General transfer comment: patient able to rise from bed at RW. Took 5 steps to recliner  Ambulation/Gait                Stairs            Wheelchair Mobility    Modified Rankin (Stroke Patients Only)       Balance Overall balance assessment: Needs assistance Sitting-balance support: Feet supported;No upper extremity supported Sitting balance-Leahy Scale: Good     Standing balance support: During functional activity;Bilateral upper extremity supported Standing balance-Leahy Scale: Fair Standing balance comment: support on RW                             Pertinent Vitals/Pain Pain Assessment: Faces Faces Pain Scale: Hurts little more Pain Location: right lower quadrant Pain Descriptors / Indicators: Discomfort Pain Intervention(s): Monitored during session    Home Living Family/patient expects to be discharged to:: Private residence Living Arrangements: Spouse/significant other Available Help at Discharge: Family;Available 24 hours/day Type of Home: House Home Access: Stairs to enter Entrance Stairs-Rails: Psychiatric nurse of Steps: 7 Home Layout: Two level;Bed/bath upstairs Home Equipment: Walker - 2 wheels;Cane - single point;Shower seat;Grab bars - tub/shower      Prior Function Level of Independence: Independent  Hand Dominance   Dominant Hand: Right    Extremity/Trunk Assessment   Upper Extremity Assessment Upper Extremity Assessment: Overall WFL for tasks assessed    Lower Extremity Assessment Lower Extremity Assessment: Generalized weakness    Cervical / Trunk Assessment Cervical / Trunk Assessment: Normal  Communication    Communication: No difficulties  Cognition Arousal/Alertness: Awake/alert Behavior During Therapy: WFL for tasks assessed/performed Overall Cognitive Status: Within Functional Limits for tasks assessed                                        General Comments      Exercises General Exercises - Lower Extremity Ankle Circles/Pumps: AROM;Both;10 reps Long Arc Quad: AROM;10 reps;Both;Seated Hip Flexion/Marching: AROM;Both;10 reps;Standing Toe Raises: AROM;5 reps;Both   Assessment/Plan    PT Assessment Patient needs continued PT services  PT Problem List Decreased strength;Decreased mobility;Decreased knowledge of use of DME;Decreased activity tolerance;Decreased safety awareness       PT Treatment Interventions DME instruction;Therapeutic activities;Gait training;Therapeutic exercise;Patient/family education;Functional mobility training;Stair training    PT Goals (Current goals can be found in the Care Plan section)  Acute Rehab PT Goals Patient Stated Goal: to get strong PT Goal Formulation: With patient/family Time For Goal Achievement: 08/29/20 Potential to Achieve Goals: Good    Frequency Min 3X/week   Barriers to discharge        Co-evaluation               AM-PAC PT "6 Clicks" Mobility  Outcome Measure Help needed turning from your back to your side while in a flat bed without using bedrails?: None Help needed moving from lying on your back to sitting on the side of a flat bed without using bedrails?: A Little Help needed moving to and from a bed to a chair (including a wheelchair)?: A Little Help needed standing up from a chair using your arms (e.g., wheelchair or bedside chair)?: A Little Help needed to walk in hospital room?: A Little Help needed climbing 3-5 steps with a railing? : A Lot 6 Click Score: 18    End of Session   Activity Tolerance: Patient tolerated treatment well Patient left: in chair;with call bell/phone within  reach;with chair alarm set;with family/visitor present Nurse Communication: Mobility status PT Visit Diagnosis: Muscle weakness (generalized) (M62.81);Difficulty in walking, not elsewhere classified (R26.2)    Time: 1500-1530 PT Time Calculation (min) (ACUTE ONLY): 30 min   Charges:   PT Evaluation $PT Eval Low Complexity: 1 Low PT Treatments $Therapeutic Activity: 8-22 mins        Tresa Endo PT Acute Rehabilitation Services Pager [phone] Office [phone]   Claretha Cooper 08/15/2020, 3:40 PM

## 2020-08-16 ENCOUNTER — Encounter (HOSPITAL_COMMUNITY): Payer: Self-pay | Admitting: Gastroenterology

## 2020-08-16 ENCOUNTER — Other Ambulatory Visit (HOSPITAL_COMMUNITY): Payer: Medicare PPO

## 2020-08-16 ENCOUNTER — Ambulatory Visit (HOSPITAL_COMMUNITY): Payer: Medicare PPO

## 2020-08-16 DIAGNOSIS — A4159 Other Gram-negative sepsis: Secondary | ICD-10-CM | POA: Diagnosis not present

## 2020-08-16 DIAGNOSIS — D72829 Elevated white blood cell count, unspecified: Secondary | ICD-10-CM

## 2020-08-16 DIAGNOSIS — B952 Enterococcus as the cause of diseases classified elsewhere: Secondary | ICD-10-CM

## 2020-08-16 DIAGNOSIS — K831 Obstruction of bile duct: Secondary | ICD-10-CM

## 2020-08-16 DIAGNOSIS — C259 Malignant neoplasm of pancreas, unspecified: Secondary | ICD-10-CM

## 2020-08-16 LAB — BLOOD CULTURE ID PANEL (REFLEXED) - BCID2

## 2020-08-16 LAB — CBC WITH DIFFERENTIAL/PLATELET
Abs Immature Granulocytes: 0.36 10*3/uL — ABNORMAL HIGH (ref 0.00–0.07)
Basophils Absolute: 0 10*3/uL (ref 0.0–0.1)
Basophils Relative: 0 %
Eosinophils Absolute: 0 10*3/uL (ref 0.0–0.5)
Eosinophils Relative: 0 %
HCT: 32.2 % — ABNORMAL LOW (ref 39.0–52.0)
Hemoglobin: 10.7 g/dL — ABNORMAL LOW (ref 13.0–17.0)
Immature Granulocytes: 2 %
Lymphocytes Relative: 8 %
Lymphs Abs: 1.8 10*3/uL (ref 0.7–4.0)
MCH: 30 pg (ref 26.0–34.0)
MCHC: 33.2 g/dL (ref 30.0–36.0)
MCV: 90.2 fL (ref 80.0–100.0)
Monocytes Absolute: 1.7 10*3/uL — ABNORMAL HIGH (ref 0.1–1.0)
Monocytes Relative: 7 %
Neutro Abs: 20.1 10*3/uL — ABNORMAL HIGH (ref 1.7–7.7)
Neutrophils Relative %: 83 %
Platelets: 421 10*3/uL — ABNORMAL HIGH (ref 150–400)
RBC: 3.57 MIL/uL — ABNORMAL LOW (ref 4.22–5.81)
RDW: 15.3 % (ref 11.5–15.5)
WBC: 24 10*3/uL — ABNORMAL HIGH (ref 4.0–10.5)
nRBC: 0 % (ref 0.0–0.2)

## 2020-08-16 LAB — COMPREHENSIVE METABOLIC PANEL
ALT: 48 U/L — ABNORMAL HIGH (ref 0–44)
AST: 84 U/L — ABNORMAL HIGH (ref 15–41)
Albumin: 2.4 g/dL — ABNORMAL LOW (ref 3.5–5.0)
Alkaline Phosphatase: 776 U/L — ABNORMAL HIGH (ref 38–126)
Anion gap: 9 (ref 5–15)
BUN: 16 mg/dL (ref 8–23)
CO2: 22 mmol/L (ref 22–32)
Calcium: 8.5 mg/dL — ABNORMAL LOW (ref 8.9–10.3)
Chloride: 101 mmol/L (ref 98–111)
Creatinine, Ser: 0.45 mg/dL — ABNORMAL LOW (ref 0.61–1.24)
GFR, Estimated: >60 mL/min (ref >60)
Glucose, Bld: 127 mg/dL — ABNORMAL HIGH (ref 70–99)
Potassium: 4.1 mmol/L (ref 3.5–5.1)
Sodium: 132 mmol/L — ABNORMAL LOW (ref 135–145)
Total Bilirubin: 9.6 mg/dL — ABNORMAL HIGH (ref 0.3–1.2)
Total Protein: 6.1 g/dL — ABNORMAL LOW (ref 6.5–8.1)

## 2020-08-16 LAB — PROTIME-INR
INR: 1.7 — ABNORMAL HIGH (ref 0.8–1.2)
Prothrombin Time: 20 seconds — ABNORMAL HIGH (ref 11.4–15.2)

## 2020-08-16 LAB — HEPARIN LEVEL (UNFRACTIONATED): Heparin Unfractionated: 0.43 IU/mL (ref 0.30–0.70)

## 2020-08-16 LAB — MAGNESIUM: Magnesium: 2.1 mg/dL (ref 1.7–2.4)

## 2020-08-16 MED ORDER — SODIUM CHLORIDE 0.9 % IV SOLN
2.0000 g | INTRAVENOUS | Status: DC
  Administered 2020-08-16 (×3): 2 g via INTRAVENOUS
  Filled 2020-08-16: qty 2000
  Filled 2020-08-16: qty 2
  Filled 2020-08-16: qty 2000
  Filled 2020-08-16: qty 2
  Filled 2020-08-16: qty 2000
  Filled 2020-08-16: qty 2

## 2020-08-16 MED ORDER — DILTIAZEM HCL ER COATED BEADS 300 MG PO CP24: 320.0000 mg | ORAL_CAPSULE | Freq: Every day | ORAL | Status: DC

## 2020-08-16 MED ORDER — AMOXICILLIN-POT CLAVULANATE 875-125 MG PO TABS: 1.0000 | ORAL_TABLET | Freq: Two times a day (BID) | ORAL | 0 refills | Status: AC

## 2020-08-16 MED ORDER — FENTANYL CITRATE (PF) 100 MCG/2ML IJ SOLN
25.0000 ug | Freq: Once | INTRAMUSCULAR | Status: AC
Start: 2020-08-16 — End: 2020-08-16
  Administered 2020-08-16: 25 ug via INTRAVENOUS
  Filled 2020-08-16: qty 2

## 2020-08-16 NOTE — Consult Note (Signed)
[name] for Infectious Disease    Date of Admission:  08/04/2020     Reason for Consult: E faecalis bacteremia     Referring Physician: [facility] consult  Current antibiotics: Ampicillin    ASSESSMENT:    Enterococcus faecium bacteremia in the setting of recent ERCP Klebsiella pneumonia bacteremia status post antibiotic course Pancreatic cancer Obstructive jaundice  PLAN:    Continue ampicillin for now while he remains in the hospital. Upon discharge, would send with Augmentin 875 twice daily x 14 days Will not plan for further work-up of his bacteremia given patient's desire to go home and not pursue other invasive procedures Available as needed, please call with any questions.   Active Problems:   Hypertension   Permanent atrial fibrillation (HCC)   Cancer of head of pancreas (HCC)   Elevated LFTs   Bacteremia due to Gram-negative bacteria   Acute diastolic CHF (congestive heart failure) (HCC)   Acute respiratory failure with hypoxia (HCC)   Hypokalemia   Hypophosphatemia   Normocytic anemia   Leukocytosis   MEDICATIONS:    Scheduled Meds:  benazepril  20 mg Oral Daily   And   hydrochlorothiazide  12.5 mg Oral Daily   diltiazem  240 mg Oral Daily   mouth rinse  15 mL Mouth Rinse BID   metoprolol tartrate  75 mg Oral BID   nystatin  5 mL Oral QID   Continuous Infusions:  ampicillin (OMNIPEN) IV 2 g (08/16/20 1232)   diltiazem (CARDIZEM) infusion 7.5 mg/hr (08/16/20 0525)   heparin 1,600 Units/hr (08/15/20 2140)   lactated ringers 10 mL/hr at 08/11/20 1125   PRN Meds:.acetaminophen, alum & mag hydroxide-simeth, hydrALAZINE, labetalol, metoprolol tartrate, ondansetron (ZOFRAN) IV, traMADol, traZODone  HPI:    Barry Contreras. is a 87 y.o. male with a history of stage IV pancreatic cancer and obstructive jaundice secondary to this.  Status post ERCP 07/04/2020 with low to mid duct stricture and uncovered metal stent placed in the common bile  duct.  He was admitted 11 days ago with fevers, nausea, vomiting and found to be septic on admission.  He underwent a CT of the abdomen and pelvis which showed pneumobilia with slight gallbladder wall enhancement and wall thickening.  A dedicated RUQ ultrasound showed gallbladder wall thickening with no visible stones.  He was seen by GI and started on antibiotics.  His blood cultures grew out Klebsiella and he was treated with antibiotics.  He did have uptrending LFTs and underwent ERCP on 08/11/2020.  These continue to trend upwards after his first ERCP and he subsequently underwent a second procedure on 08/14/2020.  Following this ERCP he has had an increase in his WBC to 24.0, LFTs have increased, and T bili has now started to increase again as well.  Blood cultures were obtained on 8/8 which unfortunately are positive in 1 out of 2 bottles for Enterococcus faecium.  There was no vancomycin resistance detected.  He had been on antibiotics to treat his gram-negative bacteremia from 7/29 through yesterday.  Ampicillin was added to his regimen overnight with the positive blood culture result.  Patient was seen by GI and oncology earlier today.  Patient reports that he has decided not to pursue further work-up and that he wants to go home.  GI also discussed further treatment options with the family such as percutaneous drainage.  However, it is felt that this will not control the patient's pain and the procedure itself will likely not  extend his life.  They are not interested in further procedures that do not do so.  During our interview with the patient he reported that he was wanting to go home as well.   Past Medical History:  Diagnosis Date   Atrial fibrillation, permanent ([place])    Cancer ([place])    Diverticulitis    Family history of breast cancer 07/27/2020   Family history of colon cancer 07/27/2020   Hyperglycemia    Hypertension    Inguinal hernia    Macular degeneration    Mitral regurgitation     Umbilical hernia     Social History   Tobacco Use   Smoking status: Former    Years: 10.00    Types: Cigarettes    Quit date: 01/08/1968    Years since quitting: 52.6   Smokeless tobacco: Never  Vaping Use   Vaping Use: Never used  Substance Use Topics   Alcohol use: Yes    Comment: rarely   Drug use: No    Family History  Problem Relation Age of Onset   Hypertension Mother    Stroke Mother    Heart attack Mother    Hypertension Father    Alzheimer's disease Father    Pneumonia Father    Stroke Sister    Alzheimer's disease Sister    Congenital heart disease Brother    Breast cancer Paternal Aunt        dx after 34   Lymphoma Paternal Grandfather    Breast cancer Niece        dx 21s   Colon cancer Nephew        d. 40s    No Known Allergies  Review of Systems  Constitutional:  Negative for fever.  Respiratory:  Negative for sputum production.   Cardiovascular:  Negative for chest pain.  Gastrointestinal:  Positive for abdominal pain.  All other systems reviewed and are negative.  OBJECTIVE:   Blood pressure (!) 143/93, pulse (!) 108, temperature 99 F (37.2 C), temperature source Oral, resp. rate 20, height '5\' 2"'$  (1.575 m), weight 73.5 kg, SpO2 95 %. Body mass index is 29.64 kg/m.  Physical Exam Constitutional:      General: He is not in acute distress.    Appearance: Normal appearance.  Eyes:     General: Scleral icterus present.     Extraocular Movements: Extraocular movements intact.  Cardiovascular:     Rate and Rhythm: Normal rate and regular rhythm.  Pulmonary:     Effort: Pulmonary effort is normal. No respiratory distress.     Breath sounds: Normal breath sounds.  Abdominal:     General: Abdomen is flat.     Palpations: Abdomen is soft.     Tenderness: There is abdominal tenderness.  Skin:    Coloration: Skin is jaundiced.  Neurological:     General: No focal deficit present.     Mental Status: He is alert and oriented to person, place,  and time.  Psychiatric:        Mood and Affect: Mood normal.        Behavior: Behavior normal.     Lab Results: Lab Results  Component Value Date   WBC 24.0 (H) 08/16/2020   HGB 10.7 (L) 08/16/2020   HCT 32.2 (L) 08/16/2020   MCV 90.2 08/16/2020   PLT 421 (H) 08/16/2020    Lab Results  Component Value Date   NA 132 (L) 08/16/2020   K 4.1 08/16/2020   CO2 22  08/16/2020   GLUCOSE 127 (H) 08/16/2020   BUN 16 08/16/2020   CREATININE 0.45 (L) 08/16/2020   CALCIUM 8.5 (L) 08/16/2020   GFRNONAA >60 08/16/2020    Lab Results  Component Value Date   ALT 48 (H) 08/16/2020   AST 84 (H) 08/16/2020   ALKPHOS 776 (H) 08/16/2020   BILITOT 9.6 (H) 08/16/2020    No results found for: CRP  No results found for: ESRSEDRATE  I have reviewed the micro and lab results in Epic.  Imaging: No results found.   Imaging independently reviewed in Epic.  Raynelle Highland for Infectious Disease [facility] [phone] pager 08/16/2020, 2:11 PM

## 2020-08-16 NOTE — Discharge Summary (Signed)
Physician Discharge Summary  Barry Contreras. [id number] DOB: Sep 20, 1931 DOA: 08/04/2020  PCP: Barry Manes, MD  Admit date: 08/04/2020 Discharge date: 08/16/2020  Admitted From: Home  Disposition: Home  Recommendations for Outpatient Follow-up:  Follow up with PCP in 1-2 weeks Please obtain BMP/CBC in one week Please follow up with GI and oncology Home Health: Yes Equipment/Devices: None  discharge Condition: Guarded with palliative follow-up  CODE STATUS: Full code Diet recommendation: Regular diet Brief/Interim Summary: Patient had a 12-day hospital stay.  He was admitted on 08/04/2020 and discharged 08/16/2020.  In summary-[name] is an 87 year old Caucasian male with PMH pancreatic cancer, history of obstructive jaundice status post biliary stent placement 07/04/20, permanent atrial fibrillation on Eliquis, hypertension, prediabetes as well as other comorbidities who presented with a chief complaint of fever, nausea, vomiting, chills, and rigors.   In the ED is found to be tachycardic and tachypneic with a lactic acid of 3.4 and abnormal LFTs.  He underwent a CT of the abdomen and pelvis which showed pneumobilia with slight gallbladder wall enhancement and wall thickening.  A dedicated right upper quadrant ultrasound showed gallbladder wall thickening with no visible stone.   Case was discussed with GI.  Patient was started on antibiotics.  Blood cultures ultimately speciated out Klebsiella. Patient also developed A. fib with RVR during hospitalization.  Cardiology was consulted and he ultimately required initiation of a Cardizem drip. Due to uptrending LFTs, he underwent ERCP on 08/11/20 as well LFTs continued to trend up post 1st ERCP and plan is for repeat ERCP on 08/14/20.     Discharge Diagnoses:  Active Problems:   Hypertension   Permanent atrial fibrillation (HCC)   Cancer of head of pancreas (HCC)   Elevated LFTs   Bacteremia due to Gram-negative bacteria   Acute  diastolic CHF (congestive heart failure) (HCC)   Acute respiratory failure with hypoxia (HCC)   Hypokalemia   Hypophosphatemia   Normocytic anemia   Leukocytosis   #1 Enterococcus faecium bacteremia in the setting of recent ERCP/Klebsiella pneumonia bacteremia finished course of antibiotics seen by infectious disease recommended Augmentin 875 twice a day for 14 days.  #2 elevated LFTs patient had to ERCP during this hospital stay even though LFTs started to trend down after the second ERCP the following day the LFTs started to trend up again. 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  He had CBD stent placed in June 2022. he was followed by oncology Dr. Benay Contreras.  The patient and his wife and the daughter decided that they do not want any further work-up as this will not prolong his life or improve his quality of life they decided to go home with palliative/hospice follow-up.  He has been referred to palliative care on the day of discharge.  #4 permanent atrial fibrillation/acute diastolic heart failure he was seen by cardiology initially treated with Cardizem drip and then changed to p.o. Cardizem.  Eliquis was restarted on discharge.  Estimated body mass index is 29.64 kg/m as calculated from the following:   Height as of this encounter: '5\' 2"'$  (1.575 m).   Weight as of this encounter: 73.5 kg.  Discharge Instructions  Discharge Instructions     Diet - low sodium heart healthy   Complete by: As  directed    Increase activity slowly   Complete by: As directed       Allergies as of 08/16/2020   No Known Allergies      Medication List     STOP taking these medications    acetaminophen 650 MG CR tablet Commonly known as: TYLENOL       TAKE these medications     amoxicillin-clavulanate 875-125 MG tablet Commonly known as: Augmentin Take 1 tablet by mouth 2 (two) times daily for 14 days.   apixaban 5 MG Tabs tablet Commonly known as: ELIQUIS Take 5 mg by mouth 2 (two) times daily.   azelastine 0.1 % nasal spray Commonly known as: ASTELIN Place 1 spray into both nostrils daily as needed for rhinitis (To keep ears open).   benazepril-hydrochlorthiazide 20-12.5 MG tablet Commonly known as: LOTENSIN HCT Take 1 tablet by mouth daily.   Cardizem LA 120 MG 24 hr tablet Generic drug: diltiazem Take 120 mg by mouth daily.   dorzolamide-timolol 22.3-6.8 MG/ML ophthalmic solution Commonly known as: COSOPT Place 2 drops into both eyes 2 (two) times daily.   ketotifen 0.025 % ophthalmic solution Commonly known as: ZADITOR Place 1 drop into both eyes daily as needed (Clear and dry eyes).   lidocaine-prilocaine cream Commonly known as: EMLA Apply 1 application topically as directed. Apply 1/2 tablespoon to port 2 hours prior to stick and cover with plastic wrap to numb site   Lysine 1000 MG Tabs Take 1,000 mg by mouth daily.   metoprolol tartrate 50 MG tablet Commonly known as: LOPRESSOR Take 50 mg by mouth 2 (two) times daily.   multivitamin tablet Take 1 tablet by mouth daily.   ondansetron 8 MG tablet Commonly known as: ZOFRAN Take 1 tablet (8 mg total) by mouth every 8 (eight) hours as needed for nausea or vomiting.   PREVAGEN PO Take 1 tablet by mouth daily.   prochlorperazine 10 MG tablet Commonly known as: COMPAZINE Take 1 tablet (10 mg total) by mouth every 6 (six) hours as needed for nausea.   Saw Palmetto 450 MG Caps Take 450 mg by mouth 2 (two) times daily.   traMADol 50 MG tablet Commonly known as: ULTRAM Take 0.5-1 tablets (25-50 mg total) by mouth every 6 (six) hours as needed. What changed:  how much to take reasons to take this   VISION-VITE PRESERVE PO Take 1 tablet by mouth 2 (two) times daily.         Follow-up Information     [facility] Follow up.   Why: [facility] will follow you at home for Palliative Care. Contact information: [place] [phone]               No Known Allergies  Consultations: GI, oncology, infectious disease, cardiology   Procedures/Studies: CT ABDOMEN PELVIS W CONTRAST  Result Date: 08/04/2020 CLINICAL DATA:  Provided history of: Cholecystitis (Ped 0-18y) possible biliary stent obstruction Technologist notes state recent diagnosis of pancreatic cancer with biliary stent placed last month. Abdominal pain, nausea, vomiting, and lethargy. EXAM: CT ABDOMEN AND PELVIS WITH CONTRAST TECHNIQUE: Multidetector CT imaging of the abdomen and pelvis was performed using the standard protocol following bolus administration of intravenous contrast. CONTRAST:  33m OMNIPAQUE IOHEXOL 350 MG/ML SOLN COMPARISON:  Abdominopelvic CT 05/29/2020, MRI 06/17/2020 FINDINGS: Lower chest: Small right pleural effusion. Bibasilar atelectasis. No basilar pulmonary nodule. Small hiatal hernia. Hepatobiliary: New ill-defined hypodensity in the periphery of the  right lobe, series 2, image 14. Subtle low-density in the inferior right lobe series 2, image 33, at site of hypoenhancing lesion on MRI. Stable small subcapsular cyst in the right lobe, series 2, image 33. Biliary stent is in place with air and scattered fluid in the stent. No bile duct dilatation surround the stent. Resultant pneumobilia. There is no intrahepatic biliary ductal dilatation. Gallbladder is physiologically distended, there may be minimal gallbladder wall enhancement and wall thickening. No calcified gallstone. Pancreas: Ill-defined low-density in the pancreatic head with margins difficult to delineate, measuring approximately 3.3 x 2.3 cm, previously 3.6 x 2.6 cm. Mild atrophy of the distal pancreas. No ductal dilatation. No peripancreatic fat  stranding. Spleen: Punctate granuloma.  Normal in size.  Splenule anteriorly. Adrenals/Urinary Tract: No adrenal nodule. Exophytic 4.7 cm cyst from the anterior right kidney. Smaller cyst in the lower right kidney. No hydronephrosis. No perinephric edema. There is symmetric renal excretion on delayed phase imaging. Minimally distended urinary bladder. Stomach/Bowel: Small to moderate hiatal hernia. Stomach is decompressed. There is no bowel obstruction or inflammation. Sigmoid diverticulosis without diverticulitis. Additional scattered diverticular seen in the more proximal colon. High-riding cecum in the right mid abdomen. Diminutive appendix tentatively visualized. No appendicitis. Vascular/Lymphatic: Aortic atherosclerosis and tortuosity. No aortic aneurysm. The portal, splenic, superior mesenteric veins are patent. Small peripancreatic nodes are not enlarged by size criteria. No enlarged lymph nodes in the abdomen or pelvis. Reproductive: Prostate is unremarkable. Other: Ascites. No free air or focal fluid collection. There is a fat containing umbilical hernia. Moderate to large fat containing left inguinal hernia, small fat containing right inguinal hernia. 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 Fusion of L1 through L3 vertebral bodies. Trabeculated lesion within T10 is likely a hemangioma. No acute osseous abnormalities are seen. IMPRESSION: 1. Biliary stent in place with air and scattered fluid in the stent. No biliary dilatation. 2. Known pancreatic head mass with margins difficult to delineate, measuring approximately 3.3 x 2.3 cm, previously 3.6 x 2.6 cm. 3. New liver lesion suspicious for metastasis, there is a subtle hypodensity in the right lobe at site of suspected metastasis on MRI. 4. Slight gallbladder wall enhancement and wall thickening, nonspecific in the setting. 5. Small right pleural effusion. 6. Colonic  diverticulosis without diverticulitis. 7. Hiatal hernia. 8. Fat containing umbilical and bilateral inguinal hernias. 9. 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 Aortic Atherosclerosis (ICD10-I70.0). Electronically Signed   By: Keith Rake M.D.   On: 08/04/2020 19:23   US Abdomen Limited  Result Date: 07/20/2020 CLINICAL DATA:  87 year old gentleman with pancreatic mass and possible small liver lesion. Liver lesion measures 1 cm or smaller in is very difficult to visualize on CT and MR. Patient presents for attempted ultrasound biopsy. EXAM: ULTRASOUND ABDOMEN LIMITED RIGHT UPPER QUADRANT COMPARISON:  MR abdomen 06/17/2020; CT scan of the abdomen 05/29/2020 FINDINGS: The liver was interrogated with ultrasound. Normal hepatic contour morphology. No discrete hepatic lesions identified. Correlation with prior cross-sectional imaging was obtained during real-time ultrasound evaluation of the liver. Special attention was paid to the inferior aspect of segment 6. No lesion is evident. Echogenic material present within the decompressed biliary tree diffusely consistent with pneumobilia. Patent portal and hepatic veins. IMPRESSION: 1. The small suspected lesion in the central aspect of hepatic segment 6 is not visible sonographically and therefore cannot be biopsied. Similarly, the lesion is very difficult to detect on  both CT and MR imaging. 2. Recommend follow-up abdominal MRI in 3 months. If the lesion is enlarging, a repeat attempt at biopsy may be considered. 3. Expected pneumobilia status post ERCP with biliary stent placement. Electronically Signed   By: Jacqulynn Cadet M.D.   On: 07/20/2020 14:11   DG CHEST PORT 1 VIEW  Result Date: 08/09/2020 CLINICAL DATA:  Shortness of breath. EXAM: PORTABLE CHEST 1 VIEW COMPARISON:  08/08/2020. FINDINGS: Cardiomegaly with bilateral interstitial prominence slightly progressed from prior exam.  Small bilateral pleural effusions slightly progressed from prior exam. Findings consistent with CHF. Pneumonitis cannot be excluded. No pneumothorax. IMPRESSION: Cardiomegaly with bilateral interstitial prominence slightly progressed from prior exam. Small bilateral pleural effusions slightly progressed from prior exam. Findings most consistent with CHF. Electronically Signed   By: Marcello Moores  Register   On: 08/09/2020 07:51   DG CHEST PORT 1 VIEW  Result Date: 08/08/2020 CLINICAL DATA:  Recent diagnosis of pancreatic cancer. Fever malaise. Nausea and vomiting. EXAM: PORTABLE CHEST 1 VIEW COMPARISON:  Chest x-ray 08/07/2020. FINDINGS: Cardiomegaly with bilateral interstitial prominence in small bilateral pleural effusions again noted. Findings again consistent with CHF. Pneumonitis cannot be excluded. No pneumothorax. IMPRESSION: Cardiomegaly with bilateral interstitial prominence and small bilateral pleural effusions again noted. Findings again consistent with CHF. Pneumonitis cannot be excluded. Similar findings noted on prior study of 08/07/2020. Electronically Signed   By: Marcello Moores  Register   On: 08/08/2020 08:53   DG Chest Port 1 View  Result Date: 08/07/2020 CLINICAL DATA:  Acute respiratory distress. EXAM: PORTABLE CHEST 1 VIEW COMPARISON:  Radiograph 08/04/2020. Lung bases from abdominal CT 08/04/2020. FINDINGS: Increasing hazy opacity at the lung bases consistent with increasing pleural effusions, right greater than left. There is septal thickening and alveolar opacities consistent with pulmonary edema. Mild cardiomegaly with equivocal increase. No pneumothorax or confluent airspace disease. Bilateral glenohumeral arthropathy degenerative change of the shoulders. IMPRESSION: Findings consistent with CHF, new from prior exam. Electronically Signed   By: Keith Rake M.D.   On: 08/07/2020 20:03   DG Chest Portable 1 View  Result Date: 08/04/2020 CLINICAL DATA:  Fatigue EXAM: PORTABLE CHEST 1 VIEW  COMPARISON:  CT 07/21/2019 FINDINGS: Linear densities in the lung bases could reflect scarring or atelectasis. Heart is normal size. No effusions or acute bony abnormality. IMPRESSION: Bibasilar atelectasis or scarring Electronically Signed   By: Rolm Baptise M.D.   On: 08/04/2020 16:55   DG ERCP  Result Date: 08/14/2020 CLINICAL DATA:  Biliary obstruction EXAM: ERCP TECHNIQUE: Multiple spot images obtained with the fluoroscopic device and submitted for interpretation post-procedure. COMPARISON:  08/11/2020 FINDINGS: Initial scout image demonstrates metallic biliary stent. Subsequent images document Skopic cannulation and opacification of the biliary tree with passage of balloon catheter. Final image documents placement of a second metallic stent within the first. There is incomplete visualization of the intrahepatic biliary tree which appears only mildly ectatic centrally. IMPRESSION: Endoscopic CBD cannulation and intervention as above. These images were submitted for radiologic interpretation only. Please see the procedural report for the amount of contrast and the fluoroscopy time utilized. Electronically Signed   By: Lucrezia Europe M.D.   On: 08/14/2020 14:40   DG ERCP BILIARY & PANCREATIC DUCTS  Result Date: 08/11/2020 CLINICAL DATA:  Biliary obstruction. EXAM: ERCP TECHNIQUE: Multiple spot images obtained with the fluoroscopic device and submitted for interpretation post-procedure. FLUOROSCOPY TIME:  Fluoroscopy Time:  3 minutes and 20 seconds. Radiation Exposure Index (if provided by the fluoroscopic device): Not reported Number of Acquired Spot  Images: 5 COMPARISON:  CT Abdomen Pelvis, 08/04/2020.  ERCP, 07/04/2020 FINDINGS: Pre-existing metallic biliary stent. Multiple, limited oblique views of the right upper quadrant demonstrating endoscopy common bile duct cannulation and retrograde cholangiogram. No discrete filling defect demonstrated. IMPRESSION: Fluoroscopic imaging during ERCP and retrograde  cholangiogram, as above. Please see dictated report for intraprocedural findings. These images were submitted for radiologic interpretation only. Please see the procedural report for the amount of contrast and the fluoroscopy dose. Electronically Signed   By: Michaelle Birks MD   On: 08/11/2020 13:10   ECHOCARDIOGRAM COMPLETE  Result Date: 08/08/2020    ECHOCARDIOGRAM REPORT   Patient Name:   KALVEN ARGENT Date of Exam: 08/08/2020 Medical Rec #:  [id number]      Height:       62.0 in Accession #:    [id number]     Weight:       162.0 lb Date of Birth:  January 24, 1931      BSA:          1.748 m Patient Age:    86 years       BP:           128/92 mmHg Patient Gender: M              HR:           76 bpm. Exam Location:  Inpatient Procedure: 2D Echo, Cardiac Doppler and Color Doppler Indications:    Dyspnea  History:        Patient has prior history of Echocardiogram examinations, most                 recent 12/12/2016. Arrythmias:Atrial Fibrillation,                 Signs/Symptoms:Murmur; Risk Factors:Hypertension.  Sonographer:    Luisa Hart RDCS Referring Phys: [id number] Georgina Quint LATIF [place]  1. Left ventricular ejection fraction, by estimation, is 70 to 75%. The left ventricle has hyperdynamic function. The left ventricle has no regional wall motion abnormalities. Left ventricular diastolic function could not be evaluated.  2. Right ventricular systolic function is normal. The right ventricular size is normal. There is mildly elevated pulmonary artery systolic pressure.  3. Left atrial size was mildly dilated.  4. The mitral valve is normal in structure. Mild mitral valve regurgitation. No evidence of mitral stenosis.  5. The aortic valve is tricuspid. There is mild calcification of the aortic valve. There is mild thickening of the aortic valve. Aortic valve regurgitation is mild to moderate. Mild to moderate aortic valve sclerosis/calcification is present, without any evidence of aortic stenosis.  6. There is  mild dilatation of the ascending aorta, measuring 39 mm.  7. The inferior vena cava is normal in size with greater than 50% respiratory variability, suggesting right atrial pressure of 3 mmHg. Comparison(s): Prior images reviewed side by side. Degenerative aortic valve changes and the degree of aortic insufficiency is slightly worse. The ascending aorta is not seen as clearly on the current study and was previously measured as larger. FINDINGS  Left Ventricle: Left ventricular ejection fraction, by estimation, is 70 to 75%. The left ventricle has hyperdynamic function. The left ventricle has no regional wall motion abnormalities. The left ventricular internal cavity size was normal in size. There is no left ventricular hypertrophy. Left ventricular diastolic function could not be evaluated due to atrial fibrillation. Left ventricular diastolic function could not be evaluated. Right Ventricle: The right ventricular size is normal. No increase in  right ventricular wall thickness. Right ventricular systolic function is normal. There is mildly elevated pulmonary artery systolic pressure. The tricuspid regurgitant velocity is 3.03  m/s, and with an assumed right atrial pressure of 3 mmHg, the estimated right ventricular systolic pressure is [id number]. Left Atrium: Left atrial size was mildly dilated. Right Atrium: Right atrial size was normal in size. Pericardium: Trivial pericardial effusion is present. Mitral Valve: The mitral valve is normal in structure. Mild mitral valve regurgitation. No evidence of mitral valve stenosis. Tricuspid Valve: The tricuspid valve is normal in structure. Tricuspid valve regurgitation is mild . No evidence of tricuspid stenosis. Aortic Valve: The aortic valve is tricuspid. There is mild calcification of the aortic valve. There is mild thickening of the aortic valve. Aortic valve regurgitation is mild to moderate. Aortic regurgitation PHT measures 339 msec. Mild to moderate aortic valve  sclerosis/calcification is present, without any evidence of aortic stenosis. Aortic valve mean gradient measures 8.2 mmHg. Aortic valve peak gradient measures 15.7 mmHg. Aortic valve area, by VTI measures 1.99 cm. Pulmonic Valve: The pulmonic valve was normal in structure. Pulmonic valve regurgitation is trivial. No evidence of pulmonic stenosis. Aorta: The aortic root is normal in size and structure. There is mild dilatation of the ascending aorta, measuring 39 mm. Venous: The inferior vena cava is normal in size with greater than 50% respiratory variability, suggesting right atrial pressure of 3 mmHg. IAS/Shunts: No atrial level shunt detected by color flow Doppler.  LEFT VENTRICLE PLAX 2D LVIDd:         3.60 cm LVIDs:         1.80 cm LV PW:         1.40 cm LV IVS:        1.10 cm LVOT diam:     2.30 cm LV SV:         64 LV SV Index:   37 LVOT Area:     4.15 cm  LV Volumes (MOD) LV vol d, MOD A2C: 38.7 ml LV vol d, MOD A4C: 42.3 ml LV vol s, MOD A2C: 13.2 ml LV vol s, MOD A4C: 14.4 ml LV SV MOD A2C:     25.5 ml LV SV MOD A4C:     42.3 ml LV SV MOD BP:      26.8 ml RIGHT VENTRICLE RV Basal diam:  4.30 cm RV Mid diam:    2.40 cm RV S prime:     10.60 cm/s TAPSE (M-mode): 1.1 cm LEFT ATRIUM             Index       RIGHT ATRIUM           Index LA Vol (A2C):   37.3 ml 21.34 ml/m RA Area:     17.70 cm LA Vol (A4C):   62.3 ml 35.64 ml/m RA Volume:   45.70 ml  26.15 ml/m LA Biplane Vol: 51.2 ml 29.29 ml/m  AORTIC VALVE                    PULMONIC VALVE AV Area (Vmax):    2.18 cm     PV Vmax:       0.86 m/s AV Area (Vmean):   2.25 cm     PV Vmean:      57.300 cm/s AV Area (VTI):     1.99 cm     PV VTI:        0.155 m AV Vmax:  198.25 cm/s  PV Peak grad:  3.0 mmHg AV Vmean:          127.375 cm/s PV Mean grad:  2.0 mmHg AV VTI:            0.324 m AV Peak Grad:      15.7 mmHg AV Mean Grad:      8.2 mmHg LVOT Vmax:         104.00 cm/s LVOT Vmean:        69.000 cm/s LVOT VTI:          0.155 m LVOT/AV VTI  ratio: 0.48 AI PHT:            339 msec  AORTA Ao Root diam: 3.30 cm Ao Asc diam:  3.87 cm MR Peak grad:    62.1 mmHg   TRICUSPID VALVE MR Vmax:         394.00 cm/s TR Peak grad:   36.7 mmHg MR PISA:         1.01 cm    TR Vmax:        303.00 cm/s MR PISA Eff ROA: 6 mm MR PISA Radius:  0.40 cm     SHUNTS                              Systemic VTI:  0.16 m                              Systemic Diam: 2.30 cm Dani Gobble Croitoru MD Electronically signed by Sanda Klein MD Signature Date/Time: 08/08/2020/3:15:56 PM    Final    US Abdomen Limited RUQ (LIVER/GB)  Result Date: 08/04/2020 CLINICAL DATA:  Right upper quadrant pain EXAM: ULTRASOUND ABDOMEN LIMITED RIGHT UPPER QUADRANT COMPARISON:  CT earlier today FINDINGS: Gallbladder: Shadowing noted from the gallbladder fundus, likely due to gas within the gallbladder. Gallbladder wall is thickened at 7 mm. No visible stones. Common bile duct: Diameter: Metallic biliary stent noted. Unable to measure the common bile duct. Liver: Pneumobilia. No focal hepatic abnormality. Portal vein is patent on color Doppler imaging with normal direction of blood flow towards the liver. Other: None. IMPRESSION: Pneumobilia related to biliary stent. Gallbladder wall thickening.  No visible stones. Electronically Signed   By: Rolm Baptise M.D.   On: 08/04/2020 21:18   (Echo, Carotid, EGD, Colonoscopy, ERCP)    Subjective: He is resting in bed frustrated anxious discussed with family decided to go home with palliative care he is aware his prognosis is guarded  Discharge Exam: Vitals:   08/15/20 2131 08/16/20 0642  BP: 137/86 (!) 143/93  Pulse: 96 (!) 108  Resp: (!) 21 20  Temp: 99.2 F (37.3 C) 99 F (37.2 C)  SpO2:     Vitals:   08/15/20 1200 08/15/20 2131 08/15/20 2131 08/16/20 0642  BP: (!) 130/95 137/86 137/86 (!) 143/93  Pulse: 97  96 (!) 108  Resp: 16  (!) 21 20  Temp: 97.8 F (36.6 C)  99.2 F (37.3 C) 99 F (37.2 C)  TempSrc: Oral  Oral Oral  SpO2: 95%      Weight:      Height:        General: Pt is alert, awake, not in acute distress Cardiovascular: RRR, S1/S2 +, no rubs, no gallops Respiratory: CTA bilaterally, no wheezing, no rhonchi Abdominal: Soft, NT, ND, bowel sounds + Extremities: no edema, no  cyanosis    The results of significant diagnostics from this hospitalization (including imaging, microbiology, ancillary and laboratory) are listed below for reference.     Microbiology: Recent Results (from the past 240 hour(s))  MRSA Next Gen by PCR, Nasal     Status: None   Collection Time: 08/07/20  9:00 PM   Specimen: Nasal Mucosa; Nasal Swab  Result Value Ref Range Status   MRSA by PCR Next Gen NOT DETECTED NOT DETECTED Final    Comment: (NOTE) The GeneXpert MRSA Assay (FDA approved for NASAL specimens only), is one component of a comprehensive MRSA colonization surveillance program. It is not intended to diagnose MRSA infection nor to guide or monitor treatment for MRSA infections. Test performance is not FDA approved in patients less than 40 years old. Performed at [facility], [place] [address] [place], [place] [address]   Culture, blood (routine x 2)     Status: None (Preliminary result)   Collection Time: 08/14/20  4:37 AM   Specimen: BLOOD  Result Value Ref Range Status   Specimen Description   Final    BLOOD LEFT ANTECUBITAL Performed at [facility] [address] [place], [place] [address]    Special Requests   Final    BOTTLES DRAWN AEROBIC ONLY Blood Culture adequate volume Performed at [facility] [address] [place], [place] [address]    Culture   Final    NO GROWTH 2 DAYS Performed at [facility] [address] [place], [place] [address]    Report Status PENDING  Incomplete  Culture, blood (routine x 2)     Status: None (Preliminary result)   Collection Time: 08/14/20  4:37 AM   Specimen: BLOOD  Result Value Ref Range Status    Specimen Description   Final    BLOOD BLOOD LEFT FOREARM Performed at [facility] [address] [place], [place] [address]    Special Requests   Final    BOTTLES DRAWN AEROBIC ONLY Blood Culture adequate volume Performed at [facility] [address] [place], [place] [address]    Culture  Setup Time   Final    GRAM POSITIVE COCCI AEROBIC BOTTLE ONLY CRITICAL RESULT CALLED TO, READ BACK BY AND VERIFIED WITH: PHARMD J.GADHIA 08/16/20'@09'$ :30 BY TW Performed at [facility], [place] [address], [place], [place] [address]    Culture GRAM POSITIVE COCCI  Final   Report Status PENDING  Incomplete  Blood Culture ID Panel (Reflexed)     Status: Abnormal   Collection Time: 08/14/20  4:37 AM  Result Value Ref Range Status   Enterococcus faecalis NOT DETECTED NOT DETECTED Final   Enterococcus Faecium DETECTED (A) NOT DETECTED Final    Comment: CRITICAL RESULT CALLED TO, READ BACK BY AND VERIFIED WITH: PHARMD J.GADHIA 08/16/20'@09'$ :30 BY TW    Listeria monocytogenes NOT DETECTED NOT DETECTED Final   Staphylococcus species NOT DETECTED NOT DETECTED Final   Staphylococcus aureus (BCID) NOT DETECTED NOT DETECTED Final   Staphylococcus epidermidis NOT DETECTED NOT DETECTED Final   Staphylococcus lugdunensis NOT DETECTED NOT DETECTED Final   Streptococcus species NOT DETECTED NOT DETECTED Final   Streptococcus agalactiae NOT DETECTED NOT DETECTED Final   Streptococcus pneumoniae NOT DETECTED NOT DETECTED Final   Streptococcus pyogenes NOT DETECTED NOT DETECTED Final   A.calcoaceticus-baumannii NOT DETECTED NOT DETECTED Final   Bacteroides fragilis NOT DETECTED NOT DETECTED Final   Enterobacterales NOT DETECTED NOT DETECTED  Final   Enterobacter cloacae complex NOT DETECTED NOT DETECTED Final   Escherichia coli NOT DETECTED NOT DETECTED Final   Klebsiella aerogenes NOT DETECTED NOT DETECTED Final   Klebsiella oxytoca NOT DETECTED NOT DETECTED Final    Klebsiella pneumoniae NOT DETECTED NOT DETECTED Final   Proteus species NOT DETECTED NOT DETECTED Final   Salmonella species NOT DETECTED NOT DETECTED Final   Serratia marcescens NOT DETECTED NOT DETECTED Final   Haemophilus influenzae NOT DETECTED NOT DETECTED Final   Neisseria meningitidis NOT DETECTED NOT DETECTED Final   Pseudomonas aeruginosa NOT DETECTED NOT DETECTED Final   Stenotrophomonas maltophilia NOT DETECTED NOT DETECTED Final   Candida albicans NOT DETECTED NOT DETECTED Final   Candida auris NOT DETECTED NOT DETECTED Final   Candida glabrata NOT DETECTED NOT DETECTED Final   Candida krusei NOT DETECTED NOT DETECTED Final   Candida parapsilosis NOT DETECTED NOT DETECTED Final   Candida tropicalis NOT DETECTED NOT DETECTED Final   Cryptococcus neoformans/gattii NOT DETECTED NOT DETECTED Final   Vancomycin resistance NOT DETECTED NOT DETECTED Final    Comment: Performed at [facility], [place] [address], [place], [place] [address]     Labs: BNP (last 3 results) Recent Labs    08/07/20 2045  BNP [id number]*   Basic Metabolic Panel: Recent Labs  Lab 08/12/20 0311 08/13/20 0304 08/14/20 0437 08/15/20 0106 08/16/20 0359  NA 129* 128* 129* 135 132*  K 3.9 3.9 3.7 4.1 4.1  CL 99 101 97* 101 101  CO2 22 21* '23 24 22  '$ GLUCOSE 193* 143* 116* 165* 127*  BUN '16 20 12 13 16  '$ CREATININE 0.55* 0.44* 0.46* 0.50* 0.45*  CALCIUM 8.5* 8.2* 8.1* 8.5* 8.5*  MG 2.1 2.1 2.0 2.2 2.1   Liver Function Tests: Recent Labs  Lab 08/12/20 0311 08/13/20 0304 08/14/20 0437 08/15/20 0106 08/16/20 0359  AST 94* 121* 103* 63* 84*  ALT 78* 83* 69* 53* 48*  ALKPHOS 623* 590* 605* 546* 776*  BILITOT 8.5* 9.9* 12.7* 5.0* 9.6*  PROT 6.1* 5.9* 6.2* 6.0* 6.1*  ALBUMIN 2.8* 2.6* 2.5* 2.4* 2.4*   No results for input(s): LIPASE, AMYLASE in the last 168 hours. No results for input(s): AMMONIA in the last 168 hours. CBC: Recent Labs  Lab 08/13/20 0304 08/14/20 0437 08/15/20 0106  08/16/20 0359  WBC 20.4* 18.1* 15.1* 24.0*  NEUTROABS 17.4* 15.3* 12.9* 20.1*  HGB 10.7* 10.9* 10.5* 10.7*  HCT 32.9* 32.0* 31.1* 32.2*  MCV 90.9 89.6 90.7 90.2  PLT 336 326 322 421*   Cardiac Enzymes: No results for input(s): CKTOTAL, CKMB, CKMBINDEX, TROPONINI in the last 168 hours. BNP: Invalid input(s): POCBNP CBG: No results for input(s): GLUCAP in the last 168 hours. D-Dimer No results for input(s): DDIMER in the last 72 hours. Hgb A1c No results for input(s): HGBA1C in the last 72 hours. Lipid Profile No results for input(s): CHOL, HDL, LDLCALC, TRIG, CHOLHDL, LDLDIRECT in the last 72 hours. Thyroid function studies No results for input(s): TSH, T4TOTAL, T3FREE, THYROIDAB in the last 72 hours.  Invalid input(s): FREET3 Anemia work up No results for input(s): VITAMINB12, FOLATE, FERRITIN, TIBC, IRON, RETICCTPCT in the last 72 hours. Urinalysis    Component Value Date/Time   COLORURINE AMBER (A) 08/04/2020 2250   APPEARANCEUR CLEAR 08/04/2020 2250   LABSPEC 1.015 08/04/2020 2250   PHURINE 5.0 08/04/2020 2250   GLUCOSEU NEGATIVE 08/04/2020 2250   HGBUR NEGATIVE 08/04/2020 [place] 08/04/2020 2250   [place] 08/04/2020  [place] (A) 08/04/2020 2250   NITRITE NEGATIVE 08/04/2020 2250   LEUKOCYTESUR NEGATIVE 08/04/2020 2250   Sepsis Labs Invalid input(s): PROCALCITONIN,  WBC,  LACTICIDVEN Microbiology Recent Results (from the past 240 hour(s))  MRSA Next Gen by PCR, Nasal     Status: None   Collection Time: 08/07/20  9:00 PM   Specimen: Nasal Mucosa; Nasal Swab  Result Value Ref Range Status   MRSA by PCR Next Gen NOT DETECTED NOT DETECTED Final    Comment: (NOTE) The GeneXpert MRSA Assay (FDA approved for NASAL specimens only), is one component of a comprehensive MRSA colonization surveillance program. It is not intended to diagnose MRSA infection nor to guide or monitor treatment for MRSA infections. Test performance  is not FDA approved in patients less than 78 years old. Performed at [facility], [place] [address], [place], [place] [address]   Culture, blood (routine x 2)     Status: None (Preliminary result)   Collection Time: 08/14/20  4:37 AM   Specimen: BLOOD  Result Value Ref Range Status   Specimen Description   Final    BLOOD LEFT ANTECUBITAL Performed at [facility] [address], [place], [place] [address]    Special Requests   Final    BOTTLES DRAWN AEROBIC ONLY Blood Culture adequate volume Performed at [facility] [address], [place], [place] [address]    Culture   Final    NO GROWTH 2 DAYS Performed at [facility] [address] [place], [place] [address]    Report Status PENDING  Incomplete  Culture, blood (routine x 2)     Status: None (Preliminary result)   Collection Time: 08/14/20  4:37 AM   Specimen: BLOOD  Result Value Ref Range Status   Specimen Description   Final    BLOOD BLOOD LEFT FOREARM Performed at [facility] [address] [place], [place] [address]    Special Requests   Final    BOTTLES DRAWN AEROBIC ONLY Blood Culture adequate volume Performed at [facility] [address], [place], [place] [address]    Culture  Setup Time   Final    GRAM POSITIVE COCCI AEROBIC BOTTLE ONLY CRITICAL RESULT CALLED TO, READ BACK BY AND VERIFIED WITH: PHARMD J.GADHIA 08/16/20'@09'$ :30 BY TW Performed at [facility], [place] [address] [place], [place] [address]    Culture GRAM POSITIVE COCCI  Final   Report Status PENDING  Incomplete  Blood Culture ID Panel (Reflexed)     Status: Abnormal   Collection Time: 08/14/20  4:37 AM  Result Value Ref Range Status   Enterococcus faecalis NOT DETECTED NOT DETECTED Final   Enterococcus Faecium DETECTED (A) NOT DETECTED Final    Comment: CRITICAL RESULT CALLED TO, READ BACK BY AND VERIFIED WITH: PHARMD J.GADHIA  08/16/20'@09'$ :30 BY TW    Listeria monocytogenes NOT DETECTED NOT DETECTED Final   Staphylococcus species NOT DETECTED NOT DETECTED Final   Staphylococcus aureus (BCID) NOT DETECTED NOT DETECTED Final   Staphylococcus epidermidis NOT DETECTED NOT DETECTED Final   Staphylococcus lugdunensis NOT DETECTED NOT DETECTED Final   Streptococcus species NOT DETECTED NOT DETECTED Final   Streptococcus agalactiae NOT DETECTED NOT DETECTED Final   Streptococcus pneumoniae NOT DETECTED NOT DETECTED Final   Streptococcus pyogenes NOT DETECTED NOT DETECTED Final   A.calcoaceticus-baumannii NOT DETECTED NOT DETECTED Final   Bacteroides fragilis NOT DETECTED NOT DETECTED Final  Enterobacterales NOT DETECTED NOT DETECTED Final   Enterobacter cloacae complex NOT DETECTED NOT DETECTED Final   Escherichia coli NOT DETECTED NOT DETECTED Final   Klebsiella aerogenes NOT DETECTED NOT DETECTED Final   Klebsiella oxytoca NOT DETECTED NOT DETECTED Final   Klebsiella pneumoniae NOT DETECTED NOT DETECTED Final   Proteus species NOT DETECTED NOT DETECTED Final   Salmonella species NOT DETECTED NOT DETECTED Final   Serratia marcescens NOT DETECTED NOT DETECTED Final   Haemophilus influenzae NOT DETECTED NOT DETECTED Final   Neisseria meningitidis NOT DETECTED NOT DETECTED Final   Pseudomonas aeruginosa NOT DETECTED NOT DETECTED Final   Stenotrophomonas maltophilia NOT DETECTED NOT DETECTED Final   Candida albicans NOT DETECTED NOT DETECTED Final   Candida auris NOT DETECTED NOT DETECTED Final   Candida glabrata NOT DETECTED NOT DETECTED Final   Candida krusei NOT DETECTED NOT DETECTED Final   Candida parapsilosis NOT DETECTED NOT DETECTED Final   Candida tropicalis NOT DETECTED NOT DETECTED Final   Cryptococcus neoformans/gattii NOT DETECTED NOT DETECTED Final   Vancomycin resistance NOT DETECTED NOT DETECTED Final    Comment: Performed at [facility], [place] [address] [place], [place] [address]     Time  coordinating discharge: 3mnutes  SIGNED:   EGeorgette Shell MD  [facility] 08/16/2020, 4:59 PM

## 2020-08-16 NOTE — Plan of Care (Signed)

## 2020-08-16 NOTE — Progress Notes (Signed)
HEMATOLOGY-ONCOLOGY PROGRESS NOTE  SUBJECTIVE: [name] reports an episode of acute right abdomen/lower chest pain this morning.  The pain has improved after fentanyl and tramadol.  He is frustrated by the rise in the bilirubin today.  Oncology History  Cancer of head of pancreas ([place])  07/13/2020 Initial Diagnosis   Cancer of head of pancreas ([place])    07/13/2020 Cancer Staging   Staging form: Exocrine Pancreas, AJCC 8th Edition - Clinical: Stage IV (cT2, cN1, cM1) - Signed by [name], MD on 07/13/2020    08/08/2020 Genetic Testing   Negative hereditary cancer genetic testing: no pathogenic variants detected in [facility] CustomNext-Cancer +RNAinsight Panel.  The report date is August 08, 2020.    The CustomNext-Cancer +RNAinsight Panel offered by [facility] and includes sequencing and rearrangement analysis for the following 91 genes: AIP, ALK, APC, ATM, AXIN2, BAP1, BARD1, BLM, BMPR1A, BRCA1, BRCA2, BRIP1, CDC73, CDH1, CDK4, CDKN1B, CDKN2A, CHEK2, CTNNA1, DICER1, FANCC, FH, FLCN, GALNT12, KIF1B, LZTR1, MAX, MEN1, MET, MLH1, MRE11A, MSH2, MSH3, MSH6, MUTYH, NBN, NF1, NF2, NTHL1, PALB2, PHOX2B, PMS2, POT1, PRKAR1A, PTCH1, PTEN, RAD50, RAD51C, RAD51D, RB1, RECQL, RET, SDHA, SDHAF2, SDHB, SDHC, SDHD, SMAD4, SMARCA4, SMARCB1, SMARCE1, STK11, SUFU, TMEM127, TP53, TSC1, TSC2, VHL and XRCC2 (sequencing and deletion/duplication); CASR, CFTR, CPA1, CTRC, EGFR, EGLN1, FAM175A, HOXB13, KIT, MITF, MLH3, PALLD, PDGFRA, POLD1, POLE, PRSS1, RINT1, RPS20, SPINK1 and TERT (sequencing only); EPCAM and GREM1 (deletion/duplication only). RNA data is routinely analyzed for use in variant interpretation for all genes.   08/10/2020 -  Chemotherapy    Patient is on Treatment Plan: PANCREATIC ABRAXANE / GEMCITABINE D1,8,15 Q28D        PHYSICAL EXAMINATION:  Vitals:   08/15/20 2131 08/16/20 0642  BP: 137/86 (!) 143/93  Pulse: 96 (!) 108  Resp: (!) 21 20  Temp: 99.2 F (37.3 C) 99 F (37.2 C)  SpO2:      Filed Weights   08/04/20 1608 08/14/20 1115  Weight: 73.5 kg 73.5 kg    Intake/Output from previous day: 08/09 0701 - 08/10 0700 In: 850.3 [P.O.:360; I.V.:490.3] Out: 1450 [Urine:1450]  GENERAL:alert, no distress and comfortable SKIN: Jaundice EYES: Scleral icterus present OROPHARYNX: Mild thrush over the tongue ABDOMEN:abdomen soft, tender in the bilateral upper abdomen NEURO: alert & oriented x 3 with fluent speech, no focal motor/sensory deficits  LABORATORY DATA:  I have reviewed the data as listed CMP Latest Ref Rng & Units 08/16/2020 08/15/2020 08/14/2020  Glucose 70 - 99 mg/dL 127(H) 165(H) 116(H)  BUN 8 - 23 mg/dL _0 Creatinine 0.61 - 1.24 mg/dL 0.45(L) 0.50(L) 0.46(L)  Sodium 135 - 145 mmol/L 132(L) 135 129(L)  Potassium 3.5 - 5.1 mmol/L 4.1 4.1 3.7  Chloride 98 - 111 mmol/L 101 101 97(L)  CO2 22 - 32 mmol/L _1 Calcium 8.9 - 10.3 mg/dL 8.5(L) 8.5(L) 8.1(L)  Total Protein 6.5 - 8.1 g/dL 6.1(L) 6.0(L) 6.2(L)  Total Bilirubin 0.3 - 1.2 mg/dL 9.6(H) 5.0(H) 12.7(H)  Alkaline Phos 38 - 126 U/L 776(H) 546(H) 605(H)  AST 15 - 41 U/L 84(H) 63(H) 103(H)  ALT 0 - 44 U/L 48(H) 53(H) 69(H)    Lab Results  Component Value Date   WBC 24.0 (H) 08/16/2020   HGB 10.7 (L) 08/16/2020   HCT 32.2 (L) 08/16/2020   MCV 90.2 08/16/2020   PLT 421 (H) 08/16/2020   NEUTROABS 20.1 (H) 08/16/2020    CT ABDOMEN PELVIS W CONTRAST  Result Date: 08/04/2020 CLINICAL DATA:  Provided history of:  Cholecystitis (Ped 0-18y) possible biliary stent obstruction Technologist notes state recent diagnosis of pancreatic cancer with biliary stent placed last month. Abdominal pain, nausea, vomiting, and lethargy. EXAM: CT ABDOMEN AND PELVIS WITH CONTRAST TECHNIQUE: Multidetector CT imaging of the abdomen and pelvis was performed using the standard protocol following bolus administration of intravenous contrast. CONTRAST:  56m OMNIPAQUE IOHEXOL 350 MG/ML SOLN COMPARISON:  Abdominopelvic CT  05/29/2020, MRI 06/17/2020 FINDINGS: Lower chest: Small right pleural effusion. Bibasilar atelectasis. No basilar pulmonary nodule. Small hiatal hernia. Hepatobiliary: New ill-defined hypodensity in the periphery of the right lobe, series 2, image 14. Subtle low-density in the inferior right lobe series 2, image 33, at site of hypoenhancing lesion on MRI. Stable small subcapsular cyst in the right lobe, series 2, image 33. Biliary stent is in place with air and scattered fluid in the stent. No bile duct dilatation surround the stent. Resultant pneumobilia. There is no intrahepatic biliary ductal dilatation. Gallbladder is physiologically distended, there may be minimal gallbladder wall enhancement and wall thickening. No calcified gallstone. Pancreas: Ill-defined low-density in the pancreatic head with margins difficult to delineate, measuring approximately 3.3 x 2.3 cm, previously 3.6 x 2.6 cm. Mild atrophy of the distal pancreas. No ductal dilatation. No peripancreatic fat stranding. Spleen: Punctate granuloma.  Normal in size.  Splenule anteriorly. Adrenals/Urinary Tract: No adrenal nodule. Exophytic 4.7 cm cyst from the anterior right kidney. Smaller cyst in the lower right kidney. No hydronephrosis. No perinephric edema. There is symmetric renal excretion on delayed phase imaging. Minimally distended urinary bladder. Stomach/Bowel: Small to moderate hiatal hernia. Stomach is decompressed. There is no bowel obstruction or inflammation. Sigmoid diverticulosis without diverticulitis. Additional scattered diverticular seen in the more proximal colon. High-riding cecum in the right mid abdomen. Diminutive appendix tentatively visualized. No appendicitis. Vascular/Lymphatic: Aortic atherosclerosis and tortuosity. No aortic aneurysm. The portal, splenic, superior mesenteric veins are patent. Small peripancreatic nodes are not enlarged by size criteria. No enlarged lymph nodes in the abdomen or pelvis. Reproductive:  Prostate is unremarkable. Other: Ascites. No free air or focal fluid collection. There is a fat containing umbilical hernia. Moderate to large fat containing left inguinal hernia, small fat containing right inguinal hernia. 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 Fusion of L1 through L3 vertebral bodies. Trabeculated lesion within T10 is likely a hemangioma. No acute osseous abnormalities are seen. IMPRESSION: 1. Biliary stent in place with air and scattered fluid in the stent. No biliary dilatation. 2. Known pancreatic head mass with margins difficult to delineate, measuring approximately 3.3 x 2.3 cm, previously 3.6 x 2.6 cm. 3. New liver lesion suspicious for metastasis, there is a subtle hypodensity in the right lobe at site of suspected metastasis on MRI. 4. Slight gallbladder wall enhancement and wall thickening, nonspecific in the setting. 5. Small right pleural effusion. 6. Colonic diverticulosis without diverticulitis. 7. Hiatal hernia. 8. Fat containing umbilical and bilateral inguinal hernias. 9. 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 Aortic Atherosclerosis (ICD10-I70.0). Electronically Signed   By: MKeith RakeM.D.   On: 08/04/2020 19:23   [place]domen Limited  Result Date: 07/20/2020 CLINICAL DATA:  87year old gentleman with pancreatic mass and possible small liver lesion. Liver lesion measures 1 cm or smaller in is very difficult to visualize on CT and MR. Patient presents for attempted ultrasound biopsy. EXAM: ULTRASOUND ABDOMEN LIMITED RIGHT UPPER QUADRANT COMPARISON:  MR abdomen 06/17/2020; CT scan of the abdomen  05/29/2020 FINDINGS: The liver was interrogated with ultrasound. Normal hepatic contour morphology. No discrete hepatic lesions identified. Correlation with prior cross-sectional imaging was obtained during real-time  ultrasound evaluation of the liver. Special attention was paid to the inferior aspect of segment 6. No lesion is evident. Echogenic material present within the decompressed biliary tree diffusely consistent with pneumobilia. Patent portal and hepatic veins. IMPRESSION: 1. The small suspected lesion in the central aspect of hepatic segment 6 is not visible sonographically and therefore cannot be biopsied. Similarly, the lesion is very difficult to detect on both CT and MR imaging. 2. Recommend follow-up abdominal MRI in 3 months. If the lesion is enlarging, a repeat attempt at biopsy may be considered. 3. Expected pneumobilia status post ERCP with biliary stent placement. Electronically Signed   By: Jacqulynn Cadet M.D.   On: 07/20/2020 14:11   DG CHEST PORT 1 VIEW  Result Date: 08/09/2020 CLINICAL DATA:  Shortness of breath. EXAM: PORTABLE CHEST 1 VIEW COMPARISON:  08/08/2020. FINDINGS: Cardiomegaly with bilateral interstitial prominence slightly progressed from prior exam. Small bilateral pleural effusions slightly progressed from prior exam. Findings consistent with CHF. Pneumonitis cannot be excluded. No pneumothorax. IMPRESSION: Cardiomegaly with bilateral interstitial prominence slightly progressed from prior exam. Small bilateral pleural effusions slightly progressed from prior exam. Findings most consistent with CHF. Electronically Signed   By: Marcello Moores  Register   On: 08/09/2020 07:51   DG CHEST PORT 1 VIEW  Result Date: 08/08/2020 CLINICAL DATA:  Recent diagnosis of pancreatic cancer. Fever malaise. Nausea and vomiting. EXAM: PORTABLE CHEST 1 VIEW COMPARISON:  Chest x-ray 08/07/2020. FINDINGS: Cardiomegaly with bilateral interstitial prominence in small bilateral pleural effusions again noted. Findings again consistent with CHF. Pneumonitis cannot be excluded. No pneumothorax. IMPRESSION: Cardiomegaly with bilateral interstitial prominence and small bilateral pleural effusions again noted. Findings  again consistent with CHF. Pneumonitis cannot be excluded. Similar findings noted on prior study of 08/07/2020. Electronically Signed   By: Marcello Moores  Register   On: 08/08/2020 08:53   DG Chest Port 1 View  Result Date: 08/07/2020 CLINICAL DATA:  Acute respiratory distress. EXAM: PORTABLE CHEST 1 VIEW COMPARISON:  Radiograph 08/04/2020. Lung bases from abdominal CT 08/04/2020. FINDINGS: Increasing hazy opacity at the lung bases consistent with increasing pleural effusions, right greater than left. There is septal thickening and alveolar opacities consistent with pulmonary edema. Mild cardiomegaly with equivocal increase. No pneumothorax or confluent airspace disease. Bilateral glenohumeral arthropathy degenerative change of the shoulders. IMPRESSION: Findings consistent with CHF, new from prior exam. Electronically Signed   By: Keith Rake M.D.   On: 08/07/2020 20:03   DG Chest Portable 1 View  Result Date: 08/04/2020 CLINICAL DATA:  Fatigue EXAM: PORTABLE CHEST 1 VIEW COMPARISON:  CT 07/21/2019 FINDINGS: Linear densities in the lung bases could reflect scarring or atelectasis. Heart is normal size. No effusions or acute bony abnormality. IMPRESSION: Bibasilar atelectasis or scarring Electronically Signed   By: Rolm Baptise M.D.   On: 08/04/2020 16:55   DG ERCP  Result Date: 08/14/2020 CLINICAL DATA:  Biliary obstruction EXAM: ERCP TECHNIQUE: Multiple spot images obtained with the fluoroscopic device and submitted for interpretation post-procedure. COMPARISON:  08/11/2020 FINDINGS: Initial scout image demonstrates metallic biliary stent. Subsequent images document Skopic cannulation and opacification of the biliary tree with passage of balloon catheter. Final image documents placement of a second metallic stent within the first. There is incomplete visualization of the intrahepatic biliary tree which appears only mildly ectatic centrally. IMPRESSION: Endoscopic CBD cannulation and intervention as above.  These images were submitted for radiologic interpretation only. Please see the procedural report for the amount of contrast and the fluoroscopy time utilized. Electronically Signed   By: Lucrezia Europe M.D.   On: 08/14/2020 14:40   DG ERCP BILIARY & PANCREATIC DUCTS  Result Date: 08/11/2020 CLINICAL DATA:  Biliary obstruction. EXAM: ERCP TECHNIQUE: Multiple spot images obtained with the fluoroscopic device and submitted for interpretation post-procedure. FLUOROSCOPY TIME:  Fluoroscopy Time:  3 minutes and 20 seconds. Radiation Exposure Index (if provided by the fluoroscopic device): Not reported Number of Acquired Spot Images: 5 COMPARISON:  CT Abdomen Pelvis, 08/04/2020.  ERCP, 07/04/2020 FINDINGS: Pre-existing metallic biliary stent. Multiple, limited oblique views of the right upper quadrant demonstrating endoscopy common bile duct cannulation and retrograde cholangiogram. No discrete filling defect demonstrated. IMPRESSION: Fluoroscopic imaging during ERCP and retrograde cholangiogram, as above. Please see dictated report for intraprocedural findings. These images were submitted for radiologic interpretation only. Please see the procedural report for the amount of contrast and the fluoroscopy dose. Electronically Signed   By: Michaelle Birks MD   On: 08/11/2020 13:10   ECHOCARDIOGRAM COMPLETE  Result Date: 08/08/2020    ECHOCARDIOGRAM REPORT   Patient Name:   Barry Contreras Date of Exam: 08/08/2020 Medical Rec #:  [id number]      Height:       62.0 in Accession #:    [id number]     Weight:       162.0 lb Date of Birth:  1931-08-07      BSA:          1.748 m Patient Age:    25 years       BP:           128/92 mmHg Patient Gender: M              HR:           76 bpm. Exam Location:  Inpatient Procedure: 2D Echo, Cardiac Doppler and Color Doppler Indications:    Dyspnea  History:        Patient has prior history of Echocardiogram examinations, most                 recent 12/12/2016. Arrythmias:Atrial Fibrillation,                  Signs/Symptoms:Murmur; Risk Factors:Hypertension.  Sonographer:    Luisa Hart RDCS Referring Phys: [id number] Georgina Quint LATIF [place]  1. Left ventricular ejection fraction, by estimation, is 70 to 75%. The left ventricle has hyperdynamic function. The left ventricle has no regional wall motion abnormalities. Left ventricular diastolic function could not be evaluated.  2. Right ventricular systolic function is normal. The right ventricular size is normal. There is mildly elevated pulmonary artery systolic pressure.  3. Left atrial size was mildly dilated.  4. The mitral valve is normal in structure. Mild mitral valve regurgitation. No evidence of mitral stenosis.  5. The aortic valve is tricuspid. There is mild calcification of the aortic valve. There is mild thickening of the aortic valve. Aortic valve regurgitation is mild to moderate. Mild to moderate aortic valve sclerosis/calcification is present, without any evidence of aortic stenosis.  6. There is mild dilatation of the ascending aorta, measuring 39 mm.  7. The inferior vena cava is normal in size with greater than 50% respiratory variability, suggesting right atrial pressure of 3 mmHg. Comparison(s): Prior images reviewed side by side. Degenerative aortic valve changes and the degree of aortic insufficiency is slightly  worse. The ascending aorta is not seen as clearly on the current study and was previously measured as larger. FINDINGS  Left Ventricle: Left ventricular ejection fraction, by estimation, is 70 to 75%. The left ventricle has hyperdynamic function. The left ventricle has no regional wall motion abnormalities. The left ventricular internal cavity size was normal in size. There is no left ventricular hypertrophy. Left ventricular diastolic function could not be evaluated due to atrial fibrillation. Left ventricular diastolic function could not be evaluated. Right Ventricle: The right ventricular size is normal. No increase in  right ventricular wall thickness. Right ventricular systolic function is normal. There is mildly elevated pulmonary artery systolic pressure. The tricuspid regurgitant velocity is 3.03  m/s, and with an assumed right atrial pressure of 3 mmHg, the estimated right ventricular systolic pressure is 84.1 mmHg. Left Atrium: Left atrial size was mildly dilated. Right Atrium: Right atrial size was normal in size. Pericardium: Trivial pericardial effusion is present. Mitral Valve: The mitral valve is normal in structure. Mild mitral valve regurgitation. No evidence of mitral valve stenosis. Tricuspid Valve: The tricuspid valve is normal in structure. Tricuspid valve regurgitation is mild . No evidence of tricuspid stenosis. Aortic Valve: The aortic valve is tricuspid. There is mild calcification of the aortic valve. There is mild thickening of the aortic valve. Aortic valve regurgitation is mild to moderate. Aortic regurgitation PHT measures 339 msec. Mild to moderate aortic valve sclerosis/calcification is present, without any evidence of aortic stenosis. Aortic valve mean gradient measures 8.2 mmHg. Aortic valve peak gradient measures 15.7 mmHg. Aortic valve area, by VTI measures 1.99 cm. Pulmonic Valve: The pulmonic valve was normal in structure. Pulmonic valve regurgitation is trivial. No evidence of pulmonic stenosis. Aorta: The aortic root is normal in size and structure. There is mild dilatation of the ascending aorta, measuring 39 mm. Venous: The inferior vena cava is normal in size with greater than 50% respiratory variability, suggesting right atrial pressure of 3 mmHg. IAS/Shunts: No atrial level shunt detected by color flow Doppler.  LEFT VENTRICLE PLAX 2D LVIDd:         3.60 cm LVIDs:         1.80 cm LV PW:         1.40 cm LV IVS:        1.10 cm LVOT diam:     2.30 cm LV SV:         64 LV SV Index:   37 LVOT Area:     4.15 cm  LV Volumes (MOD) LV vol d, MOD A2C: 38.7 ml LV vol d, MOD A4C: 42.3 ml LV vol s,  MOD A2C: 13.2 ml LV vol s, MOD A4C: 14.4 ml LV SV MOD A2C:     25.5 ml LV SV MOD A4C:     42.3 ml LV SV MOD BP:      26.8 ml RIGHT VENTRICLE RV Basal diam:  4.30 cm RV Mid diam:    2.40 cm RV S prime:     10.60 cm/s TAPSE (M-mode): 1.1 cm LEFT ATRIUM             Index       RIGHT ATRIUM           Index LA Vol (A2C):   37.3 ml 21.34 ml/m RA Area:     17.70 cm LA Vol (A4C):   62.3 ml 35.64 ml/m RA Volume:   45.70 ml  26.15 ml/m LA Biplane Vol: 51.2 ml 29.29 ml/m  AORTIC VALVE                    PULMONIC VALVE AV Area (Vmax):    2.18 cm     PV Vmax:       0.86 m/s AV Area (Vmean):   2.25 cm     PV Vmean:      57.300 cm/s AV Area (VTI):     1.99 cm     PV VTI:        0.155 m AV Vmax:           198.25 cm/s  PV Peak grad:  3.0 mmHg AV Vmean:          127.375 cm/s PV Mean grad:  2.0 mmHg AV VTI:            0.324 m AV Peak Grad:      15.7 mmHg AV Mean Grad:      8.2 mmHg LVOT Vmax:         104.00 cm/s LVOT Vmean:        69.000 cm/s LVOT VTI:          0.155 m LVOT/AV VTI ratio: 0.48 AI PHT:            339 msec  AORTA Ao Root diam: 3.30 cm Ao Asc diam:  3.87 cm MR Peak grad:    62.1 mmHg   TRICUSPID VALVE MR Vmax:         394.00 cm/s TR Peak grad:   36.7 mmHg MR PISA:         1.01 cm    TR Vmax:        303.00 cm/s MR PISA Eff ROA: 6 mm MR PISA Radius:  0.40 cm     SHUNTS                              Systemic VTI:  0.16 m                              Systemic Diam: 2.30 cm Dani Gobble Croitoru MD Electronically signed by Sanda Klein MD Signature Date/Time: 08/08/2020/3:15:56 PM    Final    US Abdomen Limited RUQ (LIVER/GB)  Result Date: 08/04/2020 CLINICAL DATA:  Right upper quadrant pain EXAM: ULTRASOUND ABDOMEN LIMITED RIGHT UPPER QUADRANT COMPARISON:  CT earlier today FINDINGS: Gallbladder: Shadowing noted from the gallbladder fundus, likely due to gas within the gallbladder. Gallbladder wall is thickened at 7 mm. No visible stones. Common bile duct: Diameter: Metallic biliary stent noted. Unable to measure the  common bile duct. Liver: Pneumobilia. No focal hepatic abnormality. Portal vein is patent on color Doppler imaging with normal direction of blood flow towards the liver. Other: None. IMPRESSION: Pneumobilia related to biliary stent. Gallbladder wall thickening.  No visible stones. Electronically Signed   By: Rolm Baptise M.D.   On: 08/04/2020 21:18    ASSESSMENT AND PLAN: Pancreas cancer, clinical stage IV (cT2,cN1,cM1) CT abdomen/pelvis 05/29/2020 pancreas head mass versus focal pancreatitis MRI abdomen 06/17/2020-hypoenhancing mass in the pancreas head, 3.7 x 3 cm, lesion abuts the anterior surface of the portal vein, subtle 1 cm hypoenhancing segment 6 lesion suspicious for metastasis, no lymphadenopathy, distended gallbladder containing sludge and probable tiny gallstones EUS 07/04/2020, 10 mm malignant appearing peripancreatic lymph node, 35 x 25 mm pancreas head mass, abutment of the portal vein and superior mesenteric  vein, no invasion of the SMA or celiac trunk, upstream pancreatic duct dilation, FNA biopsy of the pancreas head mass-adenocarcinoma Ultrasound liver 07/20/2020-liver lesion could not be seen, biopsy procedure aborted CT 08/04/2020-new peripheral right liver lesion, subtle low density lesion in the inferior right liver at the site of a hypoenhancing lesion on MRI, no biliary duct dilatation, pancreas head mass Obstructive jaundice secondary to #1-ERCP 07/04/2020-low to mid duct stricture, uncovered metal stent placed in the common bile duct ERCP 08/11/2020-partially occluded biliary stent with minimal tumor ingrowth, biliary sludge removed with's multiple balloon pull-through's ERCP 08/14/2020-partially occluded stent with tumor ingrowth, narrowing in the mid duct, uncovered metal stent placed into the common bile duct Atrial fibrillation-maintained on apixaban anticoagulation Glaucoma Aortic aneurysm Admission 08/04/2020 with a fever and hyperbilirubinemia-blood cultures positive for  Klebsiella  Barry Contreras appears stable.  The etiology of the acute right abdominal pain earlier today is unclear.  The bilirubin and alkaline phosphatase are higher today.  The new common duct stent may not be draining appropriately.  Barry Contreras is frustrated by the length of the hospital admission and requirement for repeat procedures.  He would like to consider hospice care if the biliary obstruction cannot be relieved.  1 blood culture from 08/14/2020 is growing a gram-positive cocci.  This may be a contaminant.  Port-A-Cath placement will remain on hold.  I discussed the situation with his daughter by telephone.  Recommendations: 1.  Reconsult GI, is he a candidate for a percutaneous drain if a common duct stent is not relieving the biliary obstruction 2.  Continue antibiotics per primary team. 3.  I follow-up ID/sensitivity on the 08/14/2020 blood culture 4.  Plan to consider home hospice care if the bile duct obstruction cannot be relieved   LOS: 11 days   Betsy Coder, MD 08/16/20

## 2020-08-16 NOTE — Progress Notes (Addendum)
Barry Contreras for heparin Indication: atrial fibrillation  No Known Allergies  Patient Measurements: Height: '5\' 2"'$  (157.5 cm) Weight: 73.5 kg (162 lb 0.6 oz) IBW/kg (Calculated) : 54.6 Heparin Dosing Weight: 73 kg  Vital Signs: Temp: 99 F (37.2 C) (08/10 0642) Temp Source: Oral (08/10 0642) BP: 143/93 (08/10 [id number]) Pulse Rate: 108 (08/10 0642)  Labs: Recent Labs    08/14/20 0437 08/15/20 0106 08/16/20 0359  HGB 10.9* 10.5* 10.7*  HCT 32.0* 31.1* 32.2*  PLT 326 322 421*  LABPROT  --   --  20.0*  INR  --   --  1.7*  HEPARINUNFRC 0.47 0.44 0.43  CREATININE 0.46* 0.50* 0.45*     Estimated Creatinine Clearance: 58.3 mL/min (A) (by C-G formula based on SCr of 0.45 mg/dL (L)).   Medications:  - on Eliquis '5mg'$  bid PTA  Assessment: Patient is an 87 y.o M with hx pancreatic cancer with obstructive juandice (s/p bilary stent placement on 07/04/20) and afib on Eliquis PTA presented to the ED on 7/29 with fever and generalized weakness.  He was found to have elevated LFTs  with signs of cholangitis and underwent ERCP on 8/5 (noted to have one partially occluded stent).  He's currently on heparin drip while Eliquis is on hold.  Today, 08/16/2020: - AM heparin level therapeutic at 0.45 on drip infusing at 1600 units/hr -- s/p repeat ERCP on 8/8- unfortunately looks like T bili & LFTs are higher today than yesterday - CBC stable - no bleeding documented - IR to place [facility] for chemo once repeat BCx remain negative- now reported as Enterococcus faecium  Goal of Therapy:  Heparin level 0.3-0.7 units/ml Monitor platelets by anticoagulation protocol: Yes   Plan:  -  Continue heparin gtt @ 1600 units/hr - Daily heparin level/CBC - continue heparin drip until LFTs consistently downtrending x 48 hours and until port is placed, then okay to resume [place], Pharm.D 08/16/2020 8:44 AM

## 2020-08-16 NOTE — Progress Notes (Addendum)
Progress Note  Patient Name: Barry Contreras. Date of Encounter: 08/16/2020  [facility] Cardiologist: Elouise Munroe, MD   Subjective   No acute overnight events. Patient had episode of intense RUQ pain this morning but improved now. No real chest pain. Breathing okay. No palpitations.  Total Bili rubin back up to 9.0 today. Patient is very frustrated by this. Patient would like to go home with palliative care.  Inpatient Medications    Scheduled Meds:  benazepril  20 mg Oral Daily   And   hydrochlorothiazide  12.5 mg Oral Daily   [START ON 08/17/2020] diltiazem  300 mg Oral Daily   mouth rinse  15 mL Mouth Rinse BID   metoprolol tartrate  75 mg Oral BID   nystatin  5 mL Oral QID   Continuous Infusions:  ampicillin (OMNIPEN) IV 2 g (08/16/20 1232)   heparin 1,600 Units/hr (08/15/20 2140)   lactated ringers 10 mL/hr at 08/11/20 1125   PRN Meds: acetaminophen, alum & mag hydroxide-simeth, hydrALAZINE, labetalol, metoprolol tartrate, ondansetron (ZOFRAN) IV, traMADol, traZODone   Vital Signs    Vitals:   08/15/20 1200 08/15/20 2131 08/15/20 2131 08/16/20 0642  BP: (!) 130/95 137/86 137/86 (!) 143/93  Pulse: 97  96 (!) 108  Resp: 16  (!) 21 20  Temp: 97.8 F (36.6 C)  99.2 F (37.3 C) 99 F (37.2 C)  TempSrc: Oral  Oral Oral  SpO2: 95%     Weight:      Height:        Intake/Output Summary (Last 24 hours) at 08/16/2020 1501 Last data filed at 08/16/2020 0525 Gross per 24 hour  Intake 682.62 ml  Output 1100 ml  Net -417.38 ml   Last 3 Weights 08/14/2020 08/04/2020 07/24/2020  Weight (lbs) 162 lb 0.6 oz 162 lb 162 lb 3.2 oz  Weight (kg) 73.5 kg 73.483 kg 73.573 kg      Telemetry    Atrial fibrillation. Rates in the 100s to 110s earlier this morning but now in the 80s to 90s. - Personally Reviewed  ECG   No new ECG tracing today. - Personally Reviewed  Physical Exam   GEN: No acute distress.   Neck: No JVD Cardiac: Irregularly irregular rhythm.  Soft II/VI systolic murmur. No rubs or gallops.  Respiratory: Clear to auscultation bilaterally. GI: Soft and mildly distended. Bowel sounds present. MS: Trace lower extremity edema bilaterally. No deformity. Skin: Jaundice. Warm and dry. Neuro:  No focal deficits. Psych: Normal affect. Responds appropriately.  Labs    High Sensitivity Troponin:   Recent Labs  Lab 08/07/20 2045 08/07/20 2214  TROPONINIHS 12 12      Chemistry Recent Labs  Lab 08/14/20 0437 08/15/20 0106 08/16/20 0359  NA 129* 135 132*  K 3.7 4.1 4.1  CL 97* 101 101  CO2 '23 24 22  '$ GLUCOSE 116* 165* 127*  BUN '12 13 16  '$ CREATININE 0.46* 0.50* 0.45*  CALCIUM 8.1* 8.5* 8.5*  PROT 6.2* 6.0* 6.1*  ALBUMIN 2.5* 2.4* 2.4*  AST 103* 63* 84*  ALT 69* 53* 48*  ALKPHOS 605* 546* 776*  BILITOT 12.7* 5.0* 9.6*  GFRNONAA >60 >60 >60  ANIONGAP '9 10 9     '$ Hematology Recent Labs  Lab 08/14/20 0437 08/15/20 0106 08/16/20 0359  WBC 18.1* 15.1* 24.0*  RBC 3.57* 3.43* 3.57*  HGB 10.9* 10.5* 10.7*  HCT 32.0* 31.1* 32.2*  MCV 89.6 90.7 90.2  MCH 30.5 30.6 30.0  MCHC 34.1 33.8  33.2  RDW 15.4 15.3 15.3  PLT 326 322 421*    BNPNo results for input(s): BNP, PROBNP in the last 168 hours.   DDimer No results for input(s): DDIMER in the last 168 hours.   Radiology    No results found.  Cardiac Studies   Echocardiogram 08/07/2020: Impressions: 1. Left ventricular ejection fraction, by estimation, is 70 to 75%. The  left ventricle has hyperdynamic function. The left ventricle has no  regional wall motion abnormalities. Left ventricular diastolic function  could not be evaluated.   2. Right ventricular systolic function is normal. The right ventricular  size is normal. There is mildly elevated pulmonary artery systolic  pressure.   3. Left atrial size was mildly dilated.   4. The mitral valve is normal in structure. Mild mitral valve  regurgitation. No evidence of mitral stenosis.   5. The aortic valve is  tricuspid. There is mild calcification of the  aortic valve. There is mild thickening of the aortic valve. Aortic valve  regurgitation is mild to moderate. Mild to moderate aortic valve  sclerosis/calcification is present, without  any evidence of aortic stenosis.   6. There is mild dilatation of the ascending aorta, measuring 39 mm.   7. The inferior vena cava is normal in size with greater than 50%  respiratory variability, suggesting right atrial pressure of 3 mmHg.   Comparison(s): Prior images reviewed side by side. Degenerative aortic valve changes and the degree of aortic insufficiency is slightly worse. The ascending aorta is not seen as clearly on the current study and was previously measured as larger.  Patient Profile     87 y.o. male with a history of permanent atrial fibrillation on Eliquis, ascending thoracic aortic aneurysm followed by Dr. [name], hypertension, pancreatic cancer with obstructive juandice s/p biliary stent. Admitted on 08/04/2020 with sepsis secondary to intrabdominal infection. Cardiology consulted on 08/08/2020 for evaluation of atrial fibrillation with RVR at the request of Dr. [name].  Assessment & Plan    Permanent Atrial Fibrillation - Rates in the 80s overnight but in the 100s to 110s this morning. Suspect deconditioning is playing a role in slightly elevated rates. - Electrolytes and TSH normal. - Echo showed LVEF of 70-75% with normal wall motion. - Will stop IV Diltiazem. - Will increase PO Diltiazem to '360mg'$  starting tomorrow. - Continue Lopressor '75mg'$  twice daily. - Patient has been on IV Heparin given need for multiple ERCPs. May have Port-A-Cath placed but this on hold given positive blood culture for gram positive cocci (may be a contaminate). If no other procedures planned, can restart Eliquis.    Acute Diastolic CHF - Patient developed acute shortness of breath on 08/07/2020. Occurred after receiving IV fluids in setting of sepsis. - BNP  371. - Chest x-ray 8/3 showed cardiomegaly with bilateral interstitial prominence and small bilateral pleural effusions slightly progressed from prior exam. - Has received multiple dose of IV Lasix. Last dose on 8/3. Net negative 1.065 L this admission. Renal function stable.  - Appears euvolemic on exam. No additional Lasix needed. - Continue to monitor volume status closely.   Ascending Thoracic Aorta Aneurysm - Stable at 4.2cm on last chest CTA in 07/2019. - Not clearly seen on Echo this admission but noted to be 3.9 cm. - Follow-up with Dr. [name] as outpatient.   Hypertension - BP mildly elevated at times. - Will discontinue IV Diltiazem and increase PO Diltiazem as above. - Continue Lopressor '75mg'$  twice daily. - Restarted on  home Benazepril-HCTZ 20-12.'5mg'$  yesterday.   Otherwise, per primary team: - Sepsis secondary to intrabdominal infection and Klebsiella bacteremia - Metastatic pancreatic cancer with mets to the liver s/p biliary stent in 06/2020 - Elevated LFTs - Metabolic acidosis - Normocytic anemia - Hypophosphatemia   For questions or updates, please contact [facility] Please consult [email or web address] for contact info under        Signed, Darreld Mclean, PA-C  08/16/2020, 3:01 PM    I have seen and examined the patient along with Darreld Mclean, PA-C .  I have reviewed the chart, notes and new data.  I agree with PA/NP's note.  Key new complaints: noted plans for a palliative care approach, with focus on symptom relief Key examination changes: jaundiced, looks exhausted Key new findings / data: AFib rate controlled evenings and overnight, mild RVR in AM.   PLAN: [facility] will sign off.   Medication Recommendations:  Consolidate the IV and oral doses of diltiazem to 360 mg once daily and continue metoprolol 75 mg twice daily for rate control. Transition from IV heparin back to oral Eliquis. Other recommendations (labs, testing, etc): n/a Follow up  as an outpatient:  per patient request   Sanda Klein, MD, [place] [phone] 08/16/2020, 3:44 PM

## 2020-08-16 NOTE — Progress Notes (Signed)
-   I was asked to discuss further treatment options such as percutaneous drainage with  the family.  -Patient's wife was at the bedside and daughter was over the phone.  We did discuss that percutaneous drainage may be challenging because of lack of intrahepatic biliary dilation and presence of liver mets. -We also discussed that percutaneous drainage may help bring down his bilirubin as well as help with possible underlying bacteremia/infection.  -Daughter wanted to know if percutaneous drainage will control his pain or extend his life.  -Percutaneous catheter drainage will not control patient's pain and procedure itself will not extend patient's life.  -I did advise that it may be better to discuss with interventional radiology regarding indications, benefits and risk for this procedure, but daughter and family does not want to pursue any further procedures that does not control his pain or improve quality of his life.  -RN was at bedside during discussion.  Otis Brace MD, [place] 08/16/2020, 12:52 PM  Contact #  [phone]

## 2020-08-16 NOTE — TOC Progression Note (Addendum)
Transition of Care (TOC) - Progression Note    Patient Details  Name: Barry Contreras. MRN: [id number] Date of Birth: [birth year]  Transition of Care [facility]) CM/SW Contact  Purcell Mouton, RN Phone Number: 08/16/2020, 12:10 PM  Clinical Narrative:    Spoke with pt, wife and daughter concerning hospice, Palliative and Home Health. Pt states that he is in charge of himself and he is going home today with Hospice. Pt selected, [facility] [place] for Palliative Care. Explained to pt that he would not have HHPT coming in at this time. Pt asked that transportation be setup for him and his wife. Explained that PTAR would not allow her to ride with him. Pt's wife states that she can get someone to pick her up.  [facility] referral given.  Pt's physical address is [place], [place], [place].  Expected Discharge Plan: [place] Barriers to Discharge: No Barriers Identified  Expected Discharge Plan and Services Expected Discharge Plan: [place]   Discharge Planning Services: CM Consult Post Acute Care Choice: [place] arrangements for the past 2 months: Single Family Home                           HH Arranged: PT           Social Determinants of Health (SDOH) Interventions    Readmission Risk Interventions No flowsheet data found.

## 2020-08-16 NOTE — Progress Notes (Signed)
[place] for heparin Indication: atrial fibrillation  No Known Allergies  Patient Measurements: Height: '5\' 2"'$  (157.5 cm) Weight: 73.5 kg (162 lb 0.6 oz) IBW/kg (Calculated) : 54.6 Heparin Dosing Weight: 73 kg  Vital Signs: Temp: 99 F (37.2 C) (08/10 0642) Temp Source: Oral (08/10 0642) BP: 143/93 (08/10 [id number]) Pulse Rate: 108 (08/10 0642)  Labs: Recent Labs    08/14/20 0437 08/15/20 0106 08/16/20 0359  HGB 10.9* 10.5* 10.7*  HCT 32.0* 31.1* 32.2*  PLT 326 322 421*  LABPROT  --   --  20.0*  INR  --   --  1.7*  HEPARINUNFRC 0.47 0.44 0.43  CREATININE 0.46* 0.50* 0.45*     Estimated Creatinine Clearance: 58.3 mL/min (A) (by C-G formula based on SCr of 0.45 mg/dL (L)).   Medications:  - on Eliquis '5mg'$  bid PTA  Assessment: Patient is an 87 y.o M with hx pancreatic cancer with obstructive juandice (s/p bilary stent placement on 07/04/20) and afib on Eliquis PTA presented to the ED on 7/29 with fever and generalized weakness.  He was found to have elevated LFTs  with signs of cholangitis and underwent ERCP on 8/5 (noted to have one partially occluded stent).  He's currently on heparin drip while Eliquis is on hold.  Today, 08/16/2020: - AM heparin level therapeutic at 0.45 on drip infusing at 1600 units/hr -- s/p repeat ERCP on 8/8- unfortunately looks like T bili & LFTs are higher today than yesterday - CBC stable - no bleeding documented - IR to place [facility] for chemo once repeat BCx remain negative- initially appeared as a contaminant> now reported as Enterococcus faecium  Goal of Therapy:  Heparin level 0.3-0.7 units/ml Monitor platelets by anticoagulation protocol: Yes   Plan:  -  Continue heparin gtt @ 1600 units/hr - Daily heparin level/CBC - continue heparin drip until LFTs consistently downtrending x 48 hours and until port is placed, then okay to resume [place], Pharm.D 08/16/2020 10:09 AM

## 2020-08-16 NOTE — Progress Notes (Signed)
PHARMACY - PHYSICIAN COMMUNICATION CRITICAL VALUE ALERT - BLOOD CULTURE IDENTIFICATION (BCID)  Barry Contreras. is an 87 y.o. male who presented to [facility] on 08/04/2020 with a chief complaint of N/Vfever/chills.  Assessment:  1 of 2 bottles which is 1 of 2 sets = enterococcus faecium. Presumed from IA source w/ pancreatic cancer with obstructive jaundice s/p ERCP x 2, & s/p 2 biliary stents  Name of physician (or Provider) Contacted: Coralee Pesa & G. Sherrill via secure chat  Current antibiotics: none  Changes to prescribed antibiotics recommended:  Start Ampicillin 2 gm IV q4h  Results for orders placed or performed during the hospital encounter of 08/04/20  Blood Culture ID Panel (Reflexed) (Collected: 08/14/2020  4:37 AM)  Result Value Ref Range   Enterococcus faecalis NOT DETECTED NOT DETECTED   Enterococcus Faecium DETECTED (A) NOT DETECTED   Listeria monocytogenes NOT DETECTED NOT DETECTED   Staphylococcus species NOT DETECTED NOT DETECTED   Staphylococcus aureus (BCID) NOT DETECTED NOT DETECTED   Staphylococcus epidermidis NOT DETECTED NOT DETECTED   Staphylococcus lugdunensis NOT DETECTED NOT DETECTED   Streptococcus species NOT DETECTED NOT DETECTED   Streptococcus agalactiae NOT DETECTED NOT DETECTED   Streptococcus pneumoniae NOT DETECTED NOT DETECTED   Streptococcus pyogenes NOT DETECTED NOT DETECTED   A.calcoaceticus-baumannii NOT DETECTED NOT DETECTED   Bacteroides fragilis NOT DETECTED NOT DETECTED   Enterobacterales NOT DETECTED NOT DETECTED   Enterobacter cloacae complex NOT DETECTED NOT DETECTED   Escherichia coli NOT DETECTED NOT DETECTED   Klebsiella aerogenes NOT DETECTED NOT DETECTED   Klebsiella oxytoca NOT DETECTED NOT DETECTED   Klebsiella pneumoniae NOT DETECTED NOT DETECTED   Proteus species NOT DETECTED NOT DETECTED   Salmonella species NOT DETECTED NOT DETECTED   Serratia marcescens NOT DETECTED NOT DETECTED   Haemophilus influenzae NOT DETECTED  NOT DETECTED   Neisseria meningitidis NOT DETECTED NOT DETECTED   Pseudomonas aeruginosa NOT DETECTED NOT DETECTED   Stenotrophomonas maltophilia NOT DETECTED NOT DETECTED   Candida albicans NOT DETECTED NOT DETECTED   Candida auris NOT DETECTED NOT DETECTED   Candida glabrata NOT DETECTED NOT DETECTED   Candida krusei NOT DETECTED NOT DETECTED   Candida parapsilosis NOT DETECTED NOT DETECTED   Candida tropicalis NOT DETECTED NOT DETECTED   Cryptococcus neoformans/gattii NOT DETECTED NOT DETECTED   Vancomycin resistance NOT DETECTED NOT DETECTED    Eudelia Bunch, Pharm.D 08/16/2020 10:17 AM

## 2020-08-16 NOTE — Progress Notes (Signed)
[facility] Gastroenterology Progress Note  Barry Contreras. 87 y.o. 1931-01-28  CC: Obstructive jaundice   Subjective: Patient seen and examined at bedside.  He is frustrated because of rising total bilirubin and white counts.  He said there has been no change in his condition since hospital admission.  ROS : Afebrile, negative for chest pain   Objective: Vital signs in last 24 hours: Vitals:   08/15/20 2131 08/16/20 0642  BP: 137/86 (!) 143/93  Pulse: 96 (!) 108  Resp: (!) 21 20  Temp: 99.2 F (37.3 C) 99 F (37.2 C)  SpO2:      Physical Exam:  General:  Alert, cooperative, no distress, appears stated age  Head:  Normocephalic, without obvious abnormality, atraumatic  Eyes:  , Scleral icterus  Lungs:   No visible respiratory distress  Heart:  Irregular rate and rhythm  Abdomen:   Soft, non-tender, bowel sounds active all four quadrants,  no masses,   Neuro Alert and oriented times 3  Psych Mood and affect normal    Lab Results: Recent Labs    08/15/20 0106 08/16/20 0359  NA 135 132*  K 4.1 4.1  CL 101 101  CO2 24 22  GLUCOSE 165* 127*  BUN 13 16  CREATININE 0.50* 0.45*  CALCIUM 8.5* 8.5*  MG 2.2 2.1   Recent Labs    08/15/20 0106 08/16/20 0359  AST 63* 84*  ALT 53* 48*  ALKPHOS 546* 776*  BILITOT 5.0* 9.6*  PROT 6.0* 6.1*  ALBUMIN 2.4* 2.4*   Recent Labs    08/15/20 0106 08/16/20 0359  WBC 15.1* 24.0*  NEUTROABS 12.9* 20.1*  HGB 10.5* 10.7*  HCT 31.1* 32.2*  MCV 90.7 90.2  PLT 322 421*   Recent Labs    08/16/20 0359  LABPROT 20.0*  INR 1.7*      Assessment/Plan: -Metastatic pancreatic cancer with obstructive jaundice.  Underwent initial ERCP on July 04, 2020 with 10 French by 6 cm uncovered stent placement.  His bilirubin was down to 1.3 as of August 07, 2020 but now having worsening jaundice.    Underwent repeat ERCP 08/05 which showed possible tumor growth around the stent as well as minimal ingrowth of the tumor inside the stent.   Balloon pull-through with 9 to 10 mm balloon was performed.   -S/p ERCP with stent in stent placement 08/08 with initial improvement in total bilirubin but T bili is rising up again  History of atrial fibrillation.  On anticoagulation  Recommendation ---------------------------- -Case discussed with Dr. Watt Climes.  We were planning to get repeat CT scan done for further evaluation but patient declined any further work-up.  He said he wants to go home and consider hospice care. -I have messaged Dr. Learta Codding about his wishes not to pursue any further work-up. -If he decides to pursue any further work-up as an outpatient, they can consider interventional radiology consult for percutaneous drainage. -GI will sign off.  Call us back if needed   Otis Brace MD, [place] 08/16/2020, 8:53 AM  Contact #  [phone]

## 2020-08-17 ENCOUNTER — Telehealth: Payer: Self-pay | Admitting: *Deleted

## 2020-08-17 LAB — CULTURE, BLOOD (ROUTINE X 2): Special Requests: ADEQUATE

## 2020-08-17 NOTE — Telephone Encounter (Signed)
Called home to f/u on status and if he wishes for appointment w/Dr. Benay Spice to discuss future care measures. Daughter reports he and wife are still sleeping, did not get to bed till ~ 1 am last night. She will discuss with them today and have them call back. She did report that a Palliative Team referral was made for him with [facility] [place].  [place] to ensure the referral was made and a visit will be planned soon. Ebony Hail will f/u and call this RN with any questions.

## 2020-08-18 ENCOUNTER — Telehealth: Payer: Self-pay

## 2020-08-18 ENCOUNTER — Encounter: Payer: Self-pay | Admitting: Genetic Counselor

## 2020-08-18 ENCOUNTER — Telehealth: Payer: Self-pay | Admitting: Genetic Counselor

## 2020-08-18 NOTE — Telephone Encounter (Signed)
Revealed negative hereditary cancer genetic testing results to Mr. Hellinger's daughter per his request.  Agreed to send results and letter to Mr. Hillesheim's home address on file.

## 2020-08-18 NOTE — Telephone Encounter (Signed)
Returning call from v/m left by Pt's daughter stating Pt is having a lot of pain this morning and would like some advice. Spoke with Pt's son Corene Cornea who stated Pt was sleeping and was reluctant to wake him up. Informed Pt that Pt has tramadol and can take a half of tab or 1 tab for pain. Pt's son was not sure what Pt took and will give a return call when Pt wakes up. Informed Pt's son once we get that information we could maybe give the Pt something different for Pain. Pt's son informed me that a hospice Nurse will be coming today for a visit.

## 2020-08-19 LAB — CULTURE, BLOOD (ROUTINE X 2)
Culture: NO GROWTH
Special Requests: ADEQUATE

## 2020-08-20 ENCOUNTER — Encounter: Payer: Self-pay | Admitting: Oncology

## 2020-09-07 DEATH — deceased

## 2022-04-16 IMAGING — CT CT ABD-PELV W/ CM
2 of 5 series · 14 of 46 positions shown, 16 images · IV contrast (omnipaque)
Comparison: Abdominopelvic CT 05/29/2020, MRI 06/17/2020

CLINICAL DATA: Provided history of: Cholecystitis (Ped 0-18y)
possible biliary stent obstruction

Technologist notes state recent diagnosis of pancreatic cancer with
biliary stent placed last month. Abdominal pain, nausea, vomiting,
and lethargy.
EXAM:
CT ABDOMEN AND PELVIS WITH CONTRAST
TECHNIQUE: Multidetector CT imaging of the abdomen and pelvis was performed
using the standard protocol following bolus administration of
intravenous contrast.
CONTRAST:  80mL OMNIPAQUE IOHEXOL 350 MG/ML SOLN

[Series 2: axial st · axial · 0.85mm/px · z∈[+1000,+1360]mm · 11 of 84 slices shown, 13 images]
[im 6/84  soft-tissue]
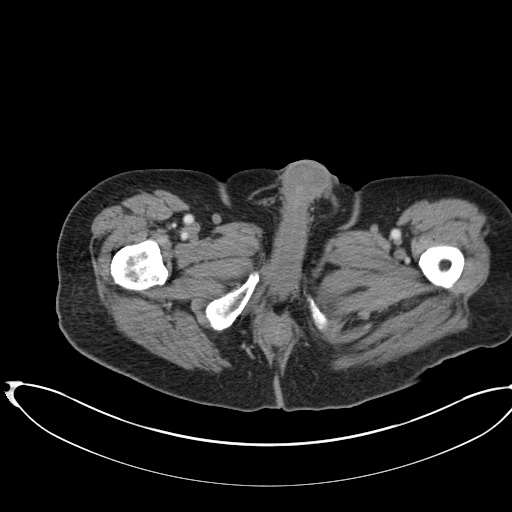
[im 6/84  bone]
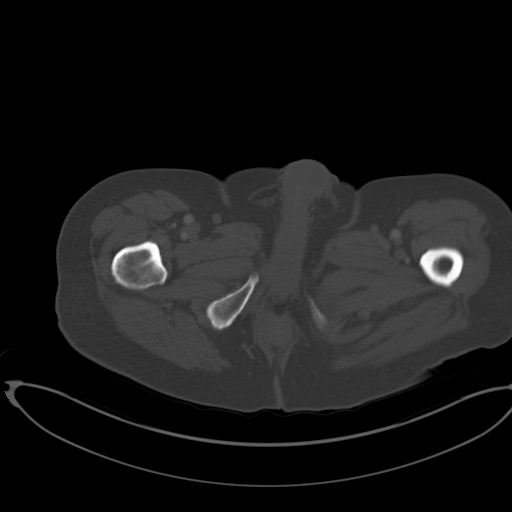
[im 12/84  soft-tissue]
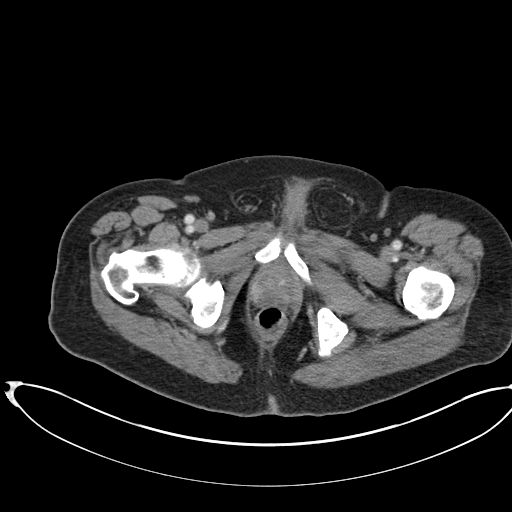
[im 23/84  soft-tissue]
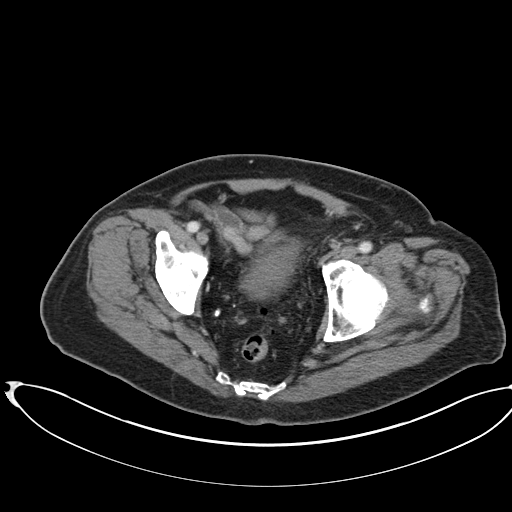
[im 28/84  soft-tissue]
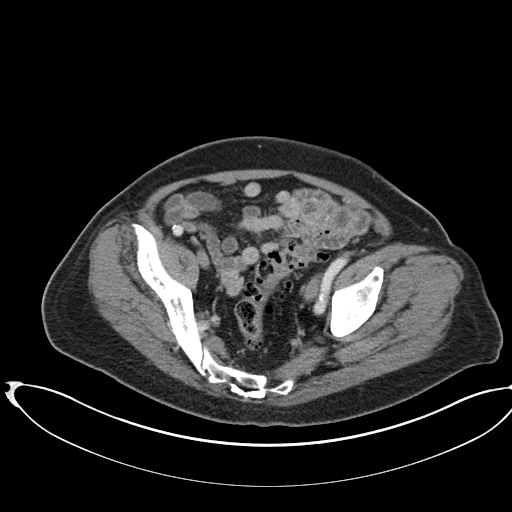
[im 34/84  soft-tissue]
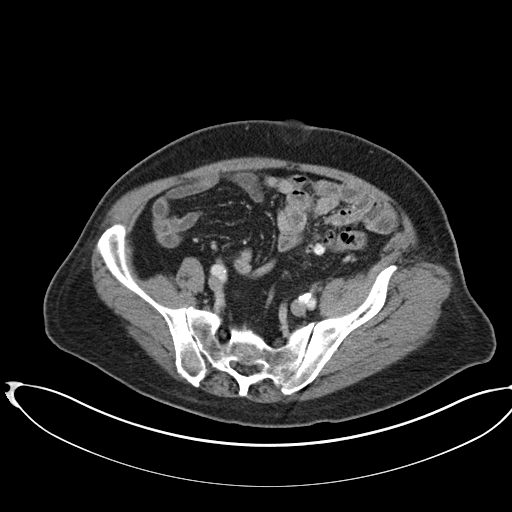
[im 45/84  soft-tissue]
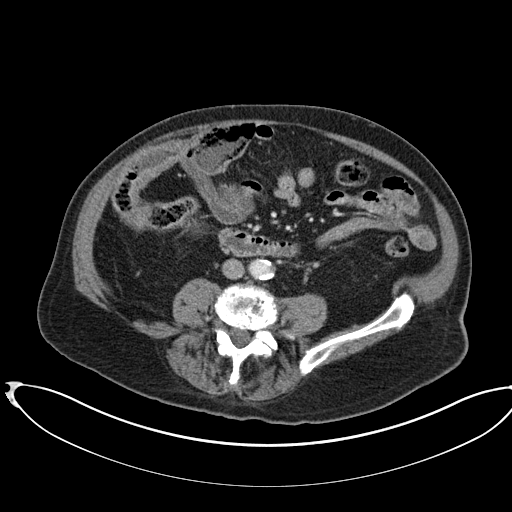
[im 50/84  soft-tissue]
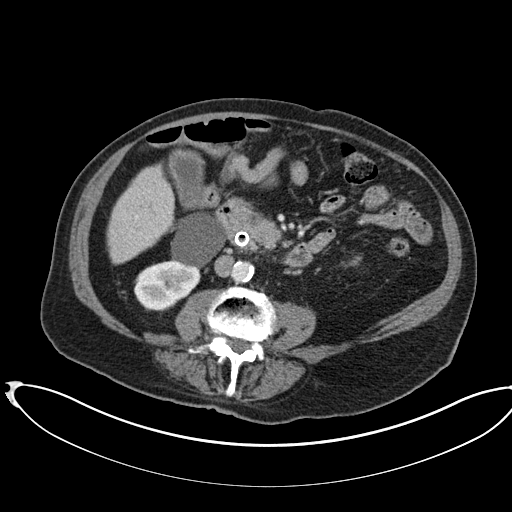
[im 56/84  soft-tissue]
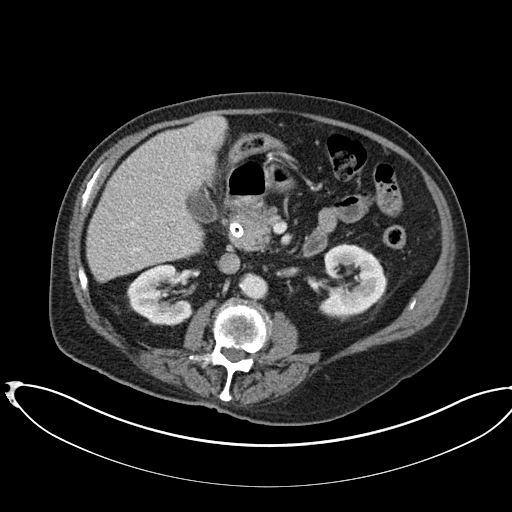
[im 61/84  soft-tissue]
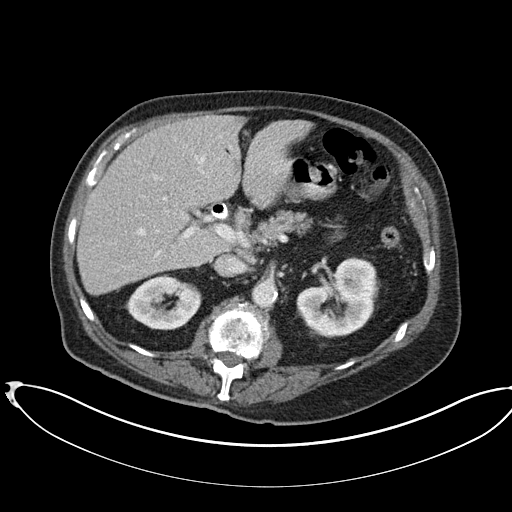
[im 61/84  bone]
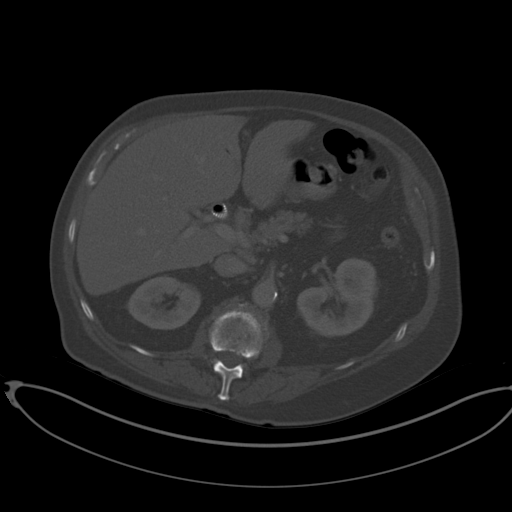
[im 72/84  soft-tissue]
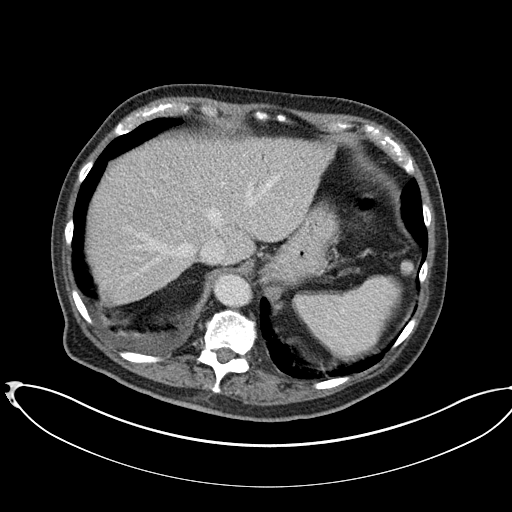
[im 78/84  soft-tissue]
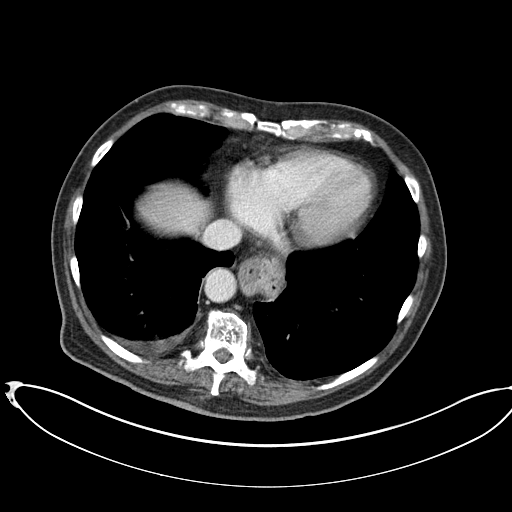

[Series 5: coronal st · coronal · 0.71mm/px · 3 of 149 slices shown]
[im 50/149  soft-tissue]
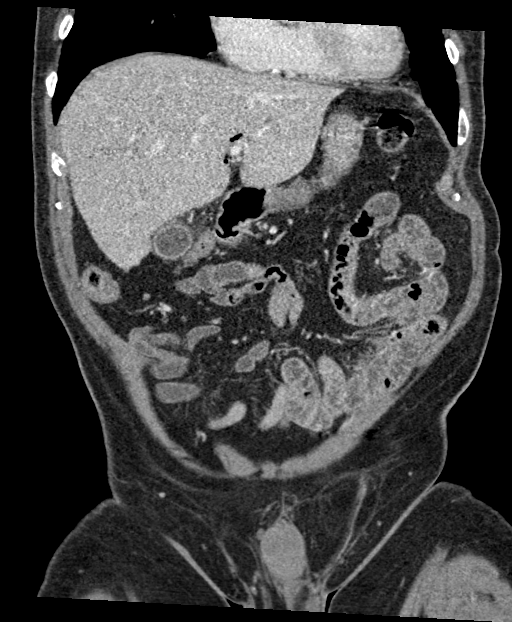
[im 66/149  soft-tissue]
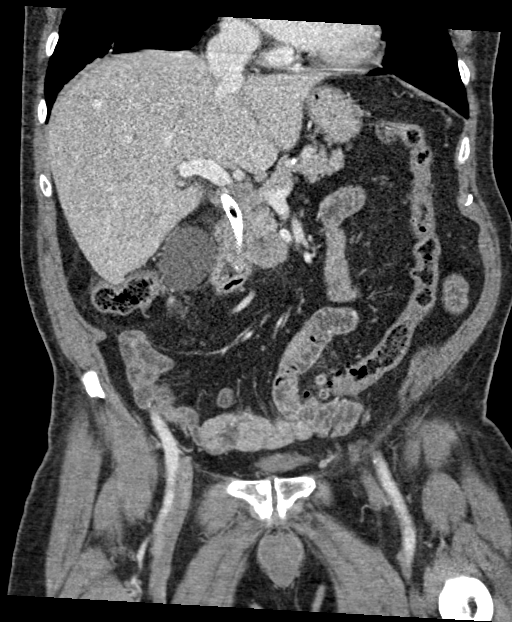
[im 83/149  soft-tissue]
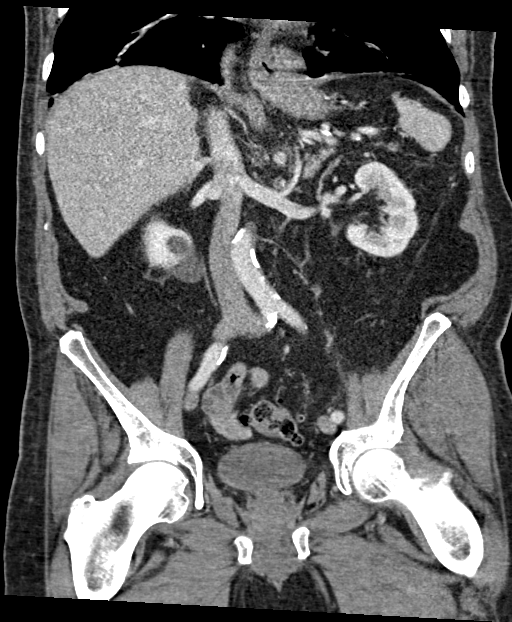

[14 of 46 positions shown; findings below may reference images not displayed]

FINDINGS: Lower chest: Small right pleural effusion. Bibasilar atelectasis. No
basilar pulmonary nodule. Small hiatal hernia.

Hepatobiliary: New ill-defined hypodensity in the periphery of the
right lobe, series 2, image 14. Subtle low-density in the inferior
right lobe series 2, image 33, at site of hypoenhancing lesion on
MRI. Stable small subcapsular cyst in the right lobe, series 2,
image 33. Biliary stent is in place with air and scattered fluid in
the stent. No bile duct dilatation surround the stent. Resultant
pneumobilia. There is no intrahepatic biliary ductal dilatation.
Gallbladder is physiologically distended, there may be minimal
gallbladder wall enhancement and wall thickening. No calcified
gallstone.

Pancreas: Ill-defined low-density in the pancreatic head with
margins difficult to delineate, measuring approximately 3.3 x
cm, previously 3.6 x 2.6 cm. Mild atrophy of the distal pancreas. No
ductal dilatation. No peripancreatic fat stranding.

Spleen: Punctate granuloma.  Normal in size.  Splenule anteriorly.

Adrenals/Urinary Tract: No adrenal nodule. Exophytic 4.7 cm cyst
from the anterior right kidney. Smaller cyst in the lower right
kidney. No hydronephrosis. No perinephric edema. There is symmetric
renal excretion on delayed phase imaging. Minimally distended
urinary bladder.

Stomach/Bowel: Small to moderate hiatal hernia. Stomach is
decompressed. There is no bowel obstruction or inflammation. Sigmoid
diverticulosis without diverticulitis. Additional scattered
diverticular seen in the more proximal colon. High-riding cecum in
the right mid abdomen. Diminutive appendix tentatively visualized.
No appendicitis.

Vascular/Lymphatic: Aortic atherosclerosis and tortuosity. No aortic
aneurysm. The portal, splenic, superior mesenteric veins are patent.
Small peripancreatic nodes are not enlarged by size criteria. No
enlarged lymph nodes in the abdomen or pelvis.

Reproductive: Prostate is unremarkable.

Other: Ascites. No free air or focal fluid collection. There is a
fat containing umbilical hernia. Moderate to large fat containing
left inguinal hernia, small fat containing right inguinal hernia.

Musculoskeletal: Bilateral L5 pars interarticularis defects with
anterolisthesis of L5 on S1 and associated degenerative change.
Fusion of L1 through L3 vertebral bodies. Trabeculated lesion within
T10 is likely a hemangioma. No acute osseous abnormalities are seen.
IMPRESSION: 1. Biliary stent in place with air and scattered fluid in the stent.
No biliary dilatation.
2. Known pancreatic head mass with margins difficult to delineate,
measuring approximately 3.3 x 2.3 cm, previously 3.6 x 2.6 cm.
3. New liver lesion suspicious for metastasis, there is a subtle
hypodensity in the right lobe at site of suspected metastasis on
MRI.
4. Slight gallbladder wall enhancement and wall thickening,
nonspecific in the setting.
5. Small right pleural effusion.
6. Colonic diverticulosis without diverticulitis.
7. Hiatal hernia.
8. Fat containing umbilical and bilateral inguinal hernias.
9. Bilateral L5 pars interarticularis defects with anterolisthesis
of L5 on S1 and associated degenerative change.

Aortic Atherosclerosis (G5HPA-M16.6).

## 2022-04-19 IMAGING — DX DG CHEST 1V PORT
2 series · 2 of 2 positions shown · non-contrast
Comparison: Radiograph 08/04/2020. Lung bases from abdominal CT
08/04/2020.

CLINICAL DATA: Acute respiratory distress.

EXAM:
PORTABLE CHEST 1 VIEW

[chest ap (1 of 2)]
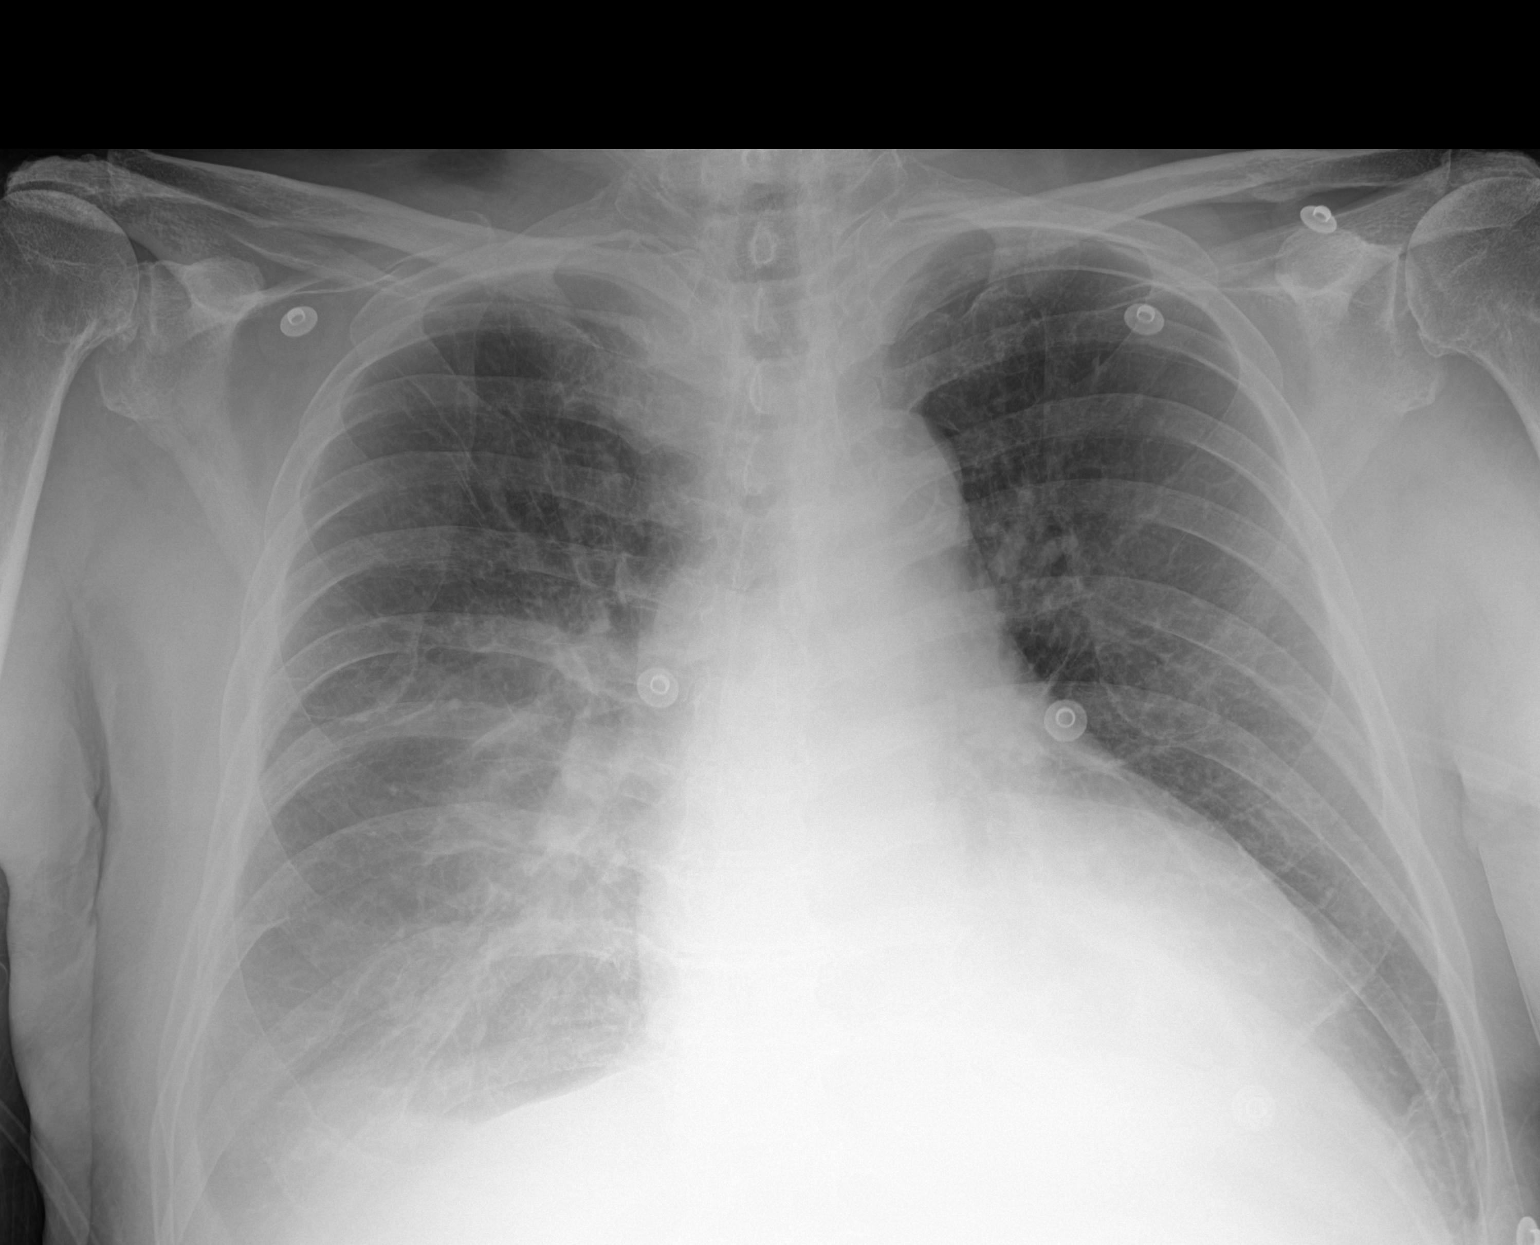

[chest ap (2 of 2)]
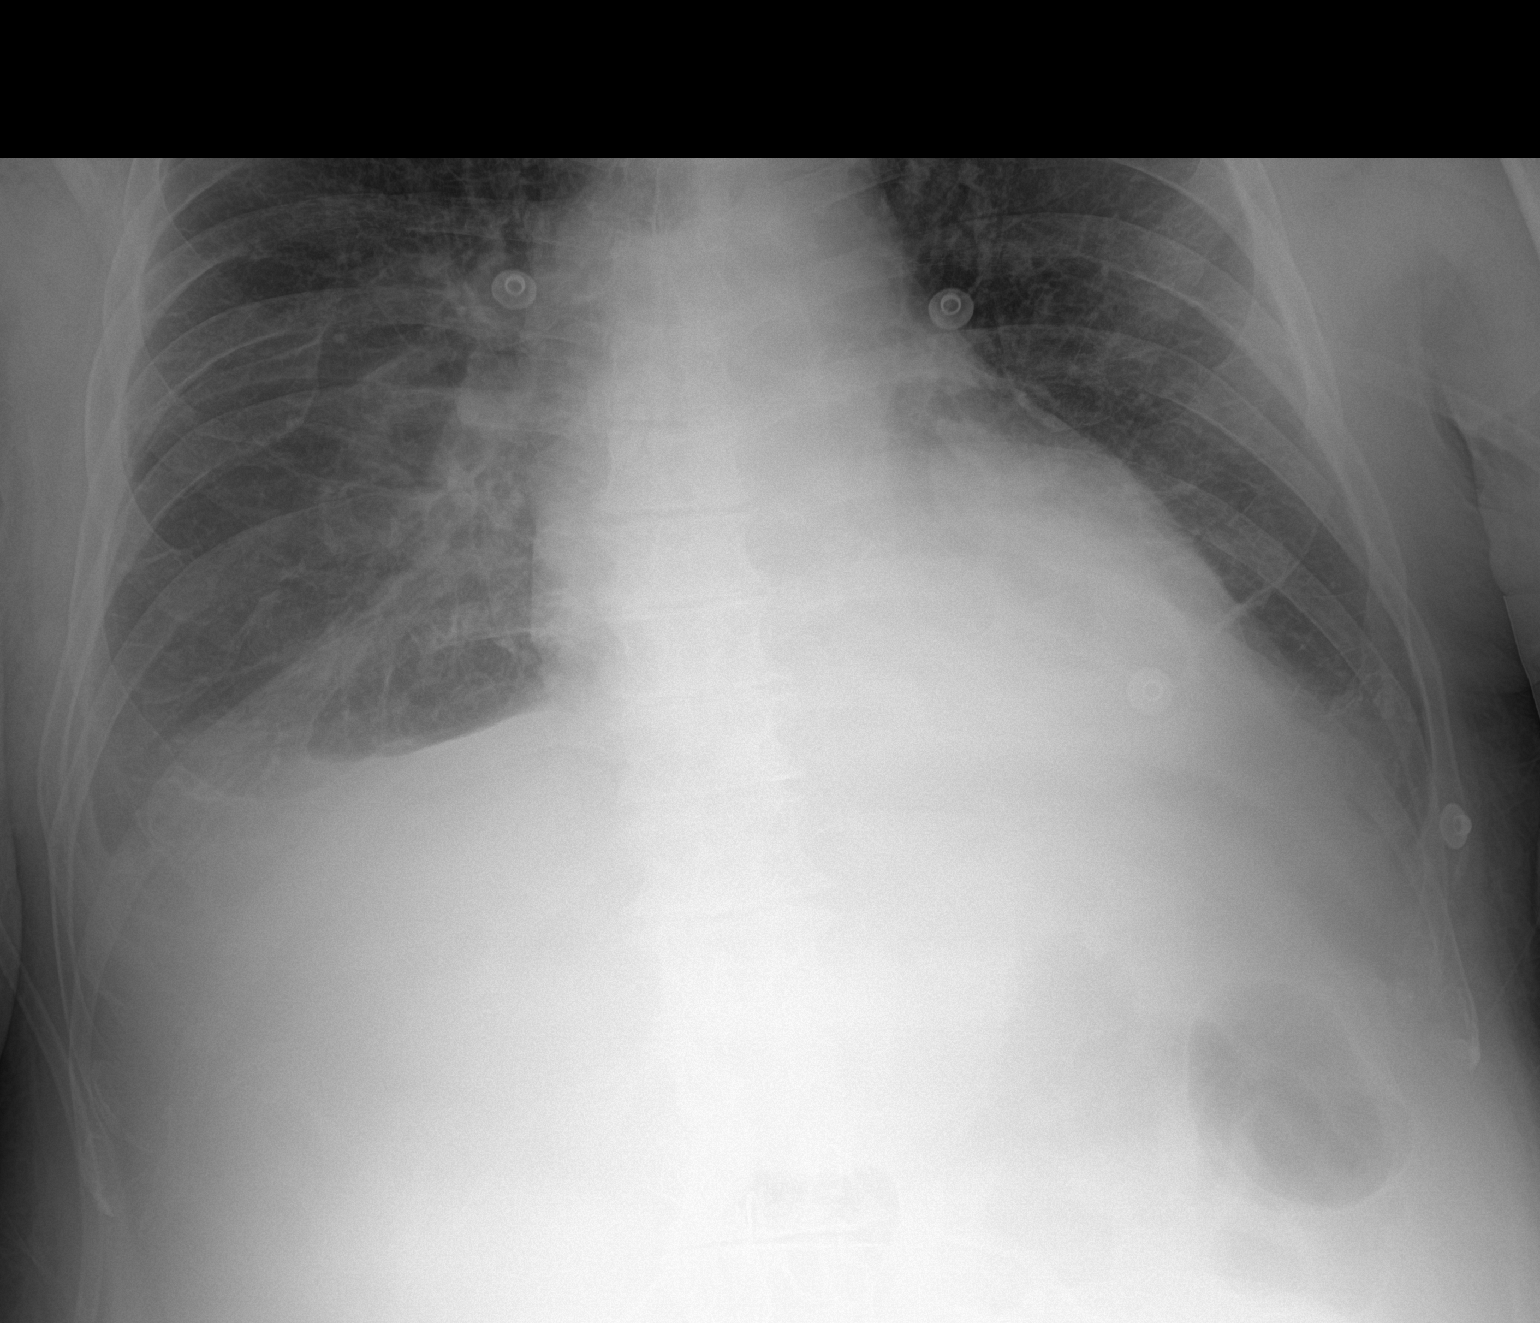

[2 of 2 positions shown; findings below may reference images not displayed]

FINDINGS: Increasing hazy opacity at the lung bases consistent with increasing
pleural effusions, right greater than left. There is septal
thickening and alveolar opacities consistent with pulmonary edema.
Mild cardiomegaly with equivocal increase. No pneumothorax or
confluent airspace disease. Bilateral glenohumeral arthropathy
degenerative change of the shoulders.
IMPRESSION: Findings consistent with CHF, new from prior exam.

## 2022-11-27 NOTE — Telephone Encounter (Signed)
Telephone call
